# Patient Record
Sex: Male | Born: 1950 | Race: White | Hispanic: No | Marital: Single | Smoking: Current some day smoker
Health system: Southern US, Community
[De-identification: ages and names within clinical notes are randomized; demographics above are authoritative.]

## PROBLEM LIST (undated history)

## (undated) DIAGNOSIS — E785 Hyperlipidemia, unspecified: Secondary | ICD-10-CM

## (undated) DIAGNOSIS — I10 Essential (primary) hypertension: Secondary | ICD-10-CM

## (undated) DIAGNOSIS — E119 Type 2 diabetes mellitus without complications: Secondary | ICD-10-CM

## (undated) DIAGNOSIS — F4310 Post-traumatic stress disorder, unspecified: Secondary | ICD-10-CM

## (undated) HISTORY — PX: TONSILLECTOMY: SUR1361

## (undated) HISTORY — DX: Hyperlipidemia, unspecified: E78.5

## (undated) HISTORY — PX: TONSILLECTOMY: SHX5217

---

## 1997-04-19 HISTORY — PX: BACK SURGERY: SHX140

## 2010-09-22 ENCOUNTER — Observation Stay (HOSPITAL_COMMUNITY)
Admission: EM | Admit: 2010-09-22 | Discharge: 2010-09-23 | Disposition: A | Discharge status: Home / Self Care | Payer: BC Managed Care – PPO | Attending: Emergency Medicine | Admitting: Emergency Medicine

## 2010-09-22 DIAGNOSIS — R5381 Other malaise: Secondary | ICD-10-CM | POA: Insufficient documentation

## 2010-09-22 DIAGNOSIS — I2510 Atherosclerotic heart disease of native coronary artery without angina pectoris: Secondary | ICD-10-CM | POA: Insufficient documentation

## 2010-09-22 DIAGNOSIS — F17200 Nicotine dependence, unspecified, uncomplicated: Secondary | ICD-10-CM | POA: Insufficient documentation

## 2010-09-22 DIAGNOSIS — F411 Generalized anxiety disorder: Secondary | ICD-10-CM | POA: Insufficient documentation

## 2010-09-22 DIAGNOSIS — R079 Chest pain, unspecified: Principal | ICD-10-CM | POA: Insufficient documentation

## 2010-09-22 DIAGNOSIS — R5383 Other fatigue: Secondary | ICD-10-CM | POA: Insufficient documentation

## 2010-09-22 DIAGNOSIS — Z79899 Other long term (current) drug therapy: Secondary | ICD-10-CM | POA: Insufficient documentation

## 2010-09-22 LAB — DIFFERENTIAL
Basophils Absolute: 0 10*3/uL (ref 0.0–0.1)
Basophils Relative: 0 % (ref 0–1)
Eosinophils Absolute: 0.1 10*3/uL (ref 0.0–0.7)
Eosinophils Relative: 1 % (ref 0–5)
Lymphocytes Relative: 32 % (ref 12–46)
Lymphs Abs: 3.1 10*3/uL (ref 0.7–4.0)
Monocytes Absolute: 0.7 10*3/uL (ref 0.1–1.0)
Monocytes Relative: 7 % (ref 3–12)
Neutro Abs: 5.8 10*3/uL (ref 1.7–7.7)
Neutrophils Relative %: 60 % (ref 43–77)

## 2010-09-22 LAB — CBC
HCT: 44.9 % (ref 39.0–52.0)
Hemoglobin: 16.4 g/dL (ref 13.0–17.0)
MCH: 31.8 pg (ref 26.0–34.0)
MCHC: 36.5 g/dL — ABNORMAL HIGH (ref 30.0–36.0)
MCV: 87.2 fL (ref 78.0–100.0)
Platelets: 222 10*3/uL (ref 150–400)
RBC: 5.15 MIL/uL (ref 4.22–5.81)
RDW: 12.3 % (ref 11.5–15.5)
WBC: 9.6 10*3/uL (ref 4.0–10.5)

## 2010-09-22 LAB — BASIC METABOLIC PANEL
BUN: 9 mg/dL (ref 6–23)
CO2: 23 mEq/L (ref 19–32)
Calcium: 10.1 mg/dL (ref 8.4–10.5)
Chloride: 107 mEq/L (ref 96–112)
Creatinine, Ser: 0.66 mg/dL (ref 0.4–1.5)
GFR calc Af Amer: >60 mL/min (ref >60)
GFR calc non Af Amer: >60 mL/min (ref >60)
Glucose, Bld: 232 mg/dL — ABNORMAL HIGH (ref 70–99)
Potassium: 4.1 mEq/L (ref 3.5–5.1)
Sodium: 139 mEq/L (ref 135–145)

## 2010-09-22 LAB — TROPONIN I: Troponin I: <0.3 ng/mL (ref <0.30)

## 2010-09-22 LAB — CK TOTAL AND CKMB (NOT AT ARMC)
CK, MB: 2.2 ng/mL (ref 0.3–4.0)
Relative Index: INVALID (ref 0.0–2.5)
Total CK: 48 U/L (ref 7–232)

## 2010-09-23 ENCOUNTER — Encounter: Payer: Self-pay | Admitting: Internal Medicine

## 2010-09-23 ENCOUNTER — Encounter: Payer: Self-pay | Admitting: *Deleted

## 2010-09-23 ENCOUNTER — Observation Stay (HOSPITAL_COMMUNITY): Payer: BC Managed Care – PPO

## 2010-09-23 ENCOUNTER — Emergency Department (HOSPITAL_COMMUNITY): Payer: BC Managed Care – PPO

## 2010-09-23 LAB — CK TOTAL AND CKMB (NOT AT ARMC)
CK, MB: 2 ng/mL (ref 0.3–4.0)
CK, MB: 1.9 ng/mL (ref 0.3–4.0)
Relative Index: INVALID (ref 0.0–2.5)
Relative Index: INVALID (ref 0.0–2.5)
Total CK: 48 U/L (ref 7–232)
Total CK: 36 U/L (ref 7–232)

## 2010-09-23 LAB — TROPONIN I
Troponin I: <0.3 ng/mL (ref <0.30)
Troponin I: <0.3 ng/mL (ref <0.30)

## 2010-09-23 LAB — GLUCOSE, CAPILLARY: Glucose-Capillary: 224 mg/dL — ABNORMAL HIGH (ref 70–99)

## 2010-09-23 MED ORDER — IOHEXOL 350 MG/ML SOLN
80.0000 mL | Freq: Once | INTRAVENOUS | Status: AC | PRN
  Administered 2010-09-23: 80 mL via INTRAVENOUS

## 2010-09-24 ENCOUNTER — Ambulatory Visit (INDEPENDENT_AMBULATORY_CARE_PROVIDER_SITE_OTHER): Payer: BC Managed Care – PPO | Admitting: Internal Medicine

## 2010-09-24 DIAGNOSIS — I10 Essential (primary) hypertension: Secondary | ICD-10-CM | POA: Insufficient documentation

## 2010-09-24 DIAGNOSIS — I2510 Atherosclerotic heart disease of native coronary artery without angina pectoris: Secondary | ICD-10-CM

## 2010-09-24 DIAGNOSIS — E7800 Pure hypercholesterolemia, unspecified: Secondary | ICD-10-CM

## 2010-09-24 DIAGNOSIS — R079 Chest pain, unspecified: Secondary | ICD-10-CM | POA: Insufficient documentation

## 2010-09-24 DIAGNOSIS — E785 Hyperlipidemia, unspecified: Secondary | ICD-10-CM

## 2010-09-24 DIAGNOSIS — I152 Hypertension secondary to endocrine disorders: Secondary | ICD-10-CM | POA: Insufficient documentation

## 2010-09-24 DIAGNOSIS — I119 Hypertensive heart disease without heart failure: Secondary | ICD-10-CM

## 2010-09-24 MED ORDER — OMEPRAZOLE 40 MG PO CPDR: 40.0000 mg | DELAYED_RELEASE_CAPSULE | Freq: Every day | ORAL | Status: DC

## 2010-09-24 NOTE — Patient Instructions (Signed)
Your physician has recommended you make the following change in your medication: Start omeprazole 40 mg by mouth daily.

## 2010-09-24 NOTE — Assessment & Plan Note (Signed)
Patient with chest pain that had some characteristics typ of angina.  But cardiac CT with mild plaquing only. I would recomm empric Rx for reflux.   He should continue ASA.  He needs to aggressively control lipids and hypertension. He would like to f/u with Dr Kathi Der office.

## 2010-09-24 NOTE — Progress Notes (Signed)
HPI  Patient is a 60 year old who presents for evaluation of chest pain.   The patient has no known history of CAD.  He says that about 1 month ago he was outside digging when he developed chest pressure.  Associated with some SOB.  Occurred on/off over several days.  At home now he continues to have intermitt spells.  Feels weak with them. He was seen at Dr. Kathi Der office.  Referred for evaluation This week he had continued spells, now after eatting. He went the the ER yesterday.  Work up included a cardiac CT.  This showed only mild plaquing.  CA score was low.  He presents for follow up.  Denies reflux.    Allergies  Allergen Reactions  . Fish Oil   . Shellfish Allergy   . Ultram (Tramadol Hcl)     Current Outpatient Prescriptions  Medication Sig Dispense Refill  . aspirin 325 MG tablet Take 325 mg by mouth every 6 (six) hours as needed.        . nitroGLYCERIN (NITROSTAT) 0.4 MG SL tablet Place 0.4 mg under the tongue every 5 (five) minutes as needed.        Marland Kitchen omeprazole (PRILOSEC) 40 MG capsule Take 1 capsule (40 mg total) by mouth daily.  30 capsule  1    Past Medical History  Diagnosis Date  . Chest pain     No past surgical history on file.  No family history on file.  History   Social History  . Marital Status: Single    Spouse Name: N/A    Number of Children: N/A  . Years of Education: N/A   Occupational History  . Not on file.   Social History Main Topics  . Smoking status: Not on file  . Smokeless tobacco: Not on file  . Alcohol Use: Not on file  . Drug Use: Not on file  . Sexually Active: Not on file   Other Topics Concern  . Not on file   Social History Narrative  . No narrative on file    Review of Systems:  All systems reviewed.  They are negative to the above problem except as previously stated.  Vital Signs: BP 154/82  Pulse 92  Resp 18  Ht 5\' 9"  (1.753 m)  Wt 229 lb (103.874 kg)  BMI 33.82 kg/m2  Physical Exam  Patient is in  NAD HEENT:  Normocephalic, atraumatic. EOMI, PERRLA.  Neck: JVP is normal. No thyromegaly. No bruits.  Lungs: clear to auscultation. No rales no wheezes.  Heart: Regular rate and rhythm. Normal S1, S2. No S3.   No significant murmurs. PMI not displaced.  Abdomen:  Supple, nontender. Normal bowel sounds. No masses. No hepatomegaly.  Extremities:   Good distal pulses throughout. No lower extremity edema.  Musculoskeletal :moving all extremities.  Neuro:   alert and oriented x3.  CN II-XII grossly intact.   Assessment and Plan:

## 2010-09-25 NOTE — Assessment & Plan Note (Signed)
Patient says his BP has been at this level for years.  I would recomm tighter control.  I will defer to Dr Christell Constant.

## 2010-09-25 NOTE — Assessment & Plan Note (Signed)
Patient's lipids were recently checked.  He has a prescription for Lipitor which he has not filled yet.  Needs to .  Again f/u with Dr Kathi Der office.

## 2010-10-15 ENCOUNTER — Encounter: Payer: Self-pay | Admitting: Internal Medicine

## 2012-10-30 ENCOUNTER — Ambulatory Visit (INDEPENDENT_AMBULATORY_CARE_PROVIDER_SITE_OTHER): Payer: BC Managed Care – PPO | Admitting: Family Medicine

## 2012-10-30 ENCOUNTER — Encounter: Payer: Self-pay | Admitting: Family Medicine

## 2012-10-30 VITALS — BP 180/105 | HR 146 | Temp 98.2°F | Resp 24 | Ht 69.0 in | Wt 236.0 lb

## 2012-10-30 DIAGNOSIS — F410 Panic disorder [episodic paroxysmal anxiety] without agoraphobia: Secondary | ICD-10-CM

## 2012-10-30 DIAGNOSIS — Z91199 Patient's noncompliance with other medical treatment and regimen due to unspecified reason: Secondary | ICD-10-CM

## 2012-10-30 DIAGNOSIS — R0602 Shortness of breath: Secondary | ICD-10-CM | POA: Insufficient documentation

## 2012-10-30 DIAGNOSIS — E119 Type 2 diabetes mellitus without complications: Secondary | ICD-10-CM | POA: Insufficient documentation

## 2012-10-30 DIAGNOSIS — Z9119 Patient's noncompliance with other medical treatment and regimen: Secondary | ICD-10-CM | POA: Insufficient documentation

## 2012-10-30 DIAGNOSIS — IMO0001 Reserved for inherently not codable concepts without codable children: Secondary | ICD-10-CM

## 2012-10-30 DIAGNOSIS — I10 Essential (primary) hypertension: Secondary | ICD-10-CM

## 2012-10-30 DIAGNOSIS — E785 Hyperlipidemia, unspecified: Secondary | ICD-10-CM

## 2012-10-30 LAB — POCT CBC
Granulocyte percent: 56.2 %G (ref 37–80)
HCT, POC: 47.2 % (ref 43.5–53.7)
Hemoglobin: 16.5 g/dL (ref 14.1–18.1)
Lymph, poc: 3.9 — AB (ref 0.6–3.4)
MCH, POC: 31.3 pg — AB (ref 27–31.2)
MCHC: 35 g/dL (ref 31.8–35.4)
MCV: 89.3 fL (ref 80–97)
MPV: 7.2 fL (ref 0–99.8)
POC Granulocyte: 5.4 (ref 2–6.9)
POC LYMPH PERCENT: 40.2 %L (ref 10–50)
Platelet Count, POC: 273 10*3/uL (ref 142–424)
RBC: 5.3 M/uL (ref 4.69–6.13)
RDW, POC: 13 %
WBC: 9.6 10*3/uL (ref 4.6–10.2)

## 2012-10-30 LAB — GLUCOSE, POCT (MANUAL RESULT ENTRY): POC Glucose: 318 mg/dl — AB (ref 70–99)

## 2012-10-30 MED ORDER — SERTRALINE HCL 50 MG PO TABS: 50.0000 mg | ORAL_TABLET | Freq: Every day | ORAL | Status: DC

## 2012-10-30 MED ORDER — AMLODIPINE BESYLATE 10 MG PO TABS: 10.0000 mg | ORAL_TABLET | Freq: Every day | ORAL | Status: DC

## 2012-10-30 MED ORDER — METFORMIN HCL 1000 MG PO TABS: 1000.0000 mg | ORAL_TABLET | Freq: Two times a day (BID) | ORAL | Status: DC

## 2012-10-30 NOTE — Patient Instructions (Signed)
      Dr Geniya Fulgham's Recommendations  Diet and Exercise discussed with patient.  For nutrition information, I recommend books:  1).Eat to Live by Dr Joel Fuhrman. 2).Prevent and Reverse Heart Disease by Dr Caldwell Esselstyn. 3) Dr Neal Barnard's Book:  Program to Reverse Diabetes  Exercise recommendations are:  If unable to walk, then the patient can exercise in a chair 3 times a day. By flapping arms like a bird gently and raising legs outwards to the front.  If ambulatory, the patient can go for walks for 30 minutes 3 times a week. Then increase the intensity and duration as tolerated.  Goal is to try to attain exercise frequency to 5 times a week.  If applicable: Best to perform resistance exercises (machines or weights) 2 days a week and cardio type exercises 3 days per week.  

## 2012-10-30 NOTE — Progress Notes (Signed)
Patient ID: Philip [name], male   DOB: November 24, 1950, 62 y.o.   MRN: [id number] SUBJECTIVE: CC: Chief Complaint  Patient presents with  . Shortness of Breath    diaphoretic, labored breathing.  Denies cough, chest pain or nausea.  Feels like he is having a panic attack.   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 He was feeding chickens over the weekend and the chicken feed dust made him cough. He also has been out of his medications for DM because he was told he needed to be seen and he refused to come in. He has been noncompliant. He denies chest pain.  Past Medical History  Diagnosis Date  . Chest pain    No past surgical history on file. History   Social History  . Marital Status: Single    Spouse Name: N/A    Number of Children: N/A  . Years of Education: N/A   Occupational History  . Not on file.   Social History Main Topics  . Smoking status: Former Games developer  . Smokeless tobacco: Not on file  . Alcohol Use: No  . Drug Use: No  . Sexually Active: Not on file   Other Topics Concern  . Not on file   Social History Narrative  . No narrative on file   No family history on file. Current Outpatient Prescriptions on File Prior to Visit  Medication Sig Dispense Refill  . aspirin 325 MG tablet Take 325 mg by mouth every 6 (six) hours as needed.        . nitroGLYCERIN (NITROSTAT) 0.4 MG SL tablet Place 0.4 mg under the tongue every 5 (five) minutes as needed.        Marland Kitchen omeprazole (PRILOSEC) 40 MG capsule Take 1 capsule (40 mg total) by mouth daily.  30 capsule  1   No current facility-administered medications on file prior to visit.   Allergies  Allergen Reactions  . Fish Oil   . Shellfish Allergy   . Ultram (Tramadol Hcl)     There is no immunization history on file for this patient. Prior to Admission medications   Medication Sig Start Date End Date Taking? Authorizing Provider  amLODipine (NORVASC) 10 MG tablet Take 1 tablet  (10 mg total) by mouth daily. 10/30/12   Ileana Ladd, MD  aspirin 325 MG tablet Take 325 mg by mouth every 6 (six) hours as needed.      Historical Provider, MD  metFORMIN (GLUCOPHAGE) 1000 MG tablet Take 1 tablet (1,000 mg total) by mouth 2 (two) times daily with a meal. 10/30/12   Ileana Ladd, MD  nitroGLYCERIN (NITROSTAT) 0.4 MG SL tablet Place 0.4 mg under the tongue every 5 (five) minutes as needed.      Historical Provider, MD  omeprazole (PRILOSEC) 40 MG capsule Take 1 capsule (40 mg total) by mouth daily. 09/24/10 09/24/11  Pricilla Riffle, MD  sertraline (ZOLOFT) 50 MG tablet Take 1 tablet (50 mg total) by mouth daily. 10/30/12   Ileana Ladd, MD    ROS: As above in the HPI. All other systems are stable or negative.  OBJECTIVE: APPEARANCE:  Patient in no acute distress.The patient appeared well nourished and normally developed. Acyanotic. Waist: VITAL SIGNS:BP 180/105  Pulse 146  Temp(Src) 98.2 F (36.8 C) (Oral)  Resp 24  Ht 5\' 9"  (1.753 m)  Wt 236 lb (107.049 kg)  BMI 34.84 kg/m2  SpO2 97% WM anxious  claiming a panic attack. Obese.  SKIN: warm and  Dry without overt rashes, tattoos and scars  HEAD and Neck: without JVD, Head and scalp: normal Eyes:No scleral icterus. Fundi normal, eye movements normal. Ears: Auricle normal, canal normal, Tympanic membranes normal, insufflation normal. Nose: normal Throat: normal Neck & thyroid: normal  CHEST & LUNGS: Chest wall: normal Lungs: Clear  CVS: Reveals the PMI to be normally located. Regular rhythm, First and Second Heart sounds are normal,  absence of murmurs, rubs or gallops. Peripheral vasculature: Radial pulses: normal Dorsal pedis pulses: normal Posterior pulses: normal  ABDOMEN:  Appearance: obese Benign, no organomegaly, no masses, no Abdominal Aortic enlargement. No Guarding , no rebound. No Bruits. Bowel sounds: normal  RECTAL: N/A GU: N/A  EXTREMETIES: nonedematous. Both Femoral and Pedal  pulses are normal.  MUSCULOSKELETAL:  Spine: normal Joints: intact  NEUROLOGIC: oriented to time,place and person; nonfocal. Strength is normal Sensory is normal Reflexes are normal Cranial Nerves are normal.  Psychiatry: Panicking., without any triggers. He was told to breathe  Slowly to prevent carpopedal spasm.  ASSESSMENT: Shortness of breath - Plan: POCT CBC, POCT glucose (manual entry), EKG 12-Lead, sertraline (ZOLOFT) 50 MG tablet  Hypertension - Plan: COMPLETE METABOLIC PANEL WITH GFR, amLODipine (NORVASC) 10 MG tablet  Dyslipidemia - Plan: COMPLETE METABOLIC PANEL WITH GFR  Type II or unspecified type diabetes mellitus without mention of complication, uncontrolled - Plan: POCT glycosylated hemoglobin (Hb A1C), COMPLETE METABOLIC PANEL WITH GFR, metFORMIN (GLUCOPHAGE) 1000 MG tablet  Panic disorder - Plan: COMPLETE METABOLIC PANEL WITH GFR, Thyroid Panel With TSH, sertraline (ZOLOFT) 50 MG tablet  Non compliance with medical treatment  BP high and patient was hyperventilating initially.  PLAN: With his  Risk factors , he was advised need for ED evaluation: he  Refused. He acknowledges non compliance and acting Against Medical advise. He  Requests treatment for panic attack.  Orders Placed This Encounter  Procedures  . COMPLETE METABOLIC PANEL WITH GFR  . Thyroid Panel With TSH  . POCT CBC  . POCT glucose (manual entry)  . POCT glycosylated hemoglobin (Hb A1C)  . EKG 12-Lead   Meds ordered this encounter  Medications  . sertraline (ZOLOFT) 50 MG tablet    Sig: Take 1 tablet (50 mg total) by mouth daily.    Dispense:  30 tablet    Refill:  3  . metFORMIN (GLUCOPHAGE) 1000 MG tablet    Sig: Take 1 tablet (1,000 mg total) by mouth 2 (two) times daily with a meal.    Dispense:  60 tablet    Refill:  2  . amLODipine (NORVASC) 10 MG tablet    Sig: Take 1 tablet (10 mg total) by mouth daily.    Dispense:  30 tablet    Refill:  2   EKG: no acute  findings.  Return in about 1 day (around 10/31/2012) for Recheck medical problems. We no don't have a standard form for patient to sign AMA since we went live on [employer]. Patient affirms that he would sign it if we had it.  He was happy with the treatment given today, and really refused anything else.  Anisten Tomassi P. Modesto Charon, M.D.

## 2012-10-31 ENCOUNTER — Ambulatory Visit (INDEPENDENT_AMBULATORY_CARE_PROVIDER_SITE_OTHER): Payer: BC Managed Care – PPO | Admitting: Family Medicine

## 2012-10-31 ENCOUNTER — Ambulatory Visit (INDEPENDENT_AMBULATORY_CARE_PROVIDER_SITE_OTHER): Payer: BC Managed Care – PPO

## 2012-10-31 ENCOUNTER — Encounter: Payer: Self-pay | Admitting: Family Medicine

## 2012-10-31 VITALS — BP 158/86 | HR 109 | Temp 97.9°F | Wt 233.0 lb

## 2012-10-31 DIAGNOSIS — E785 Hyperlipidemia, unspecified: Secondary | ICD-10-CM

## 2012-10-31 DIAGNOSIS — R0989 Other specified symptoms and signs involving the circulatory and respiratory systems: Secondary | ICD-10-CM

## 2012-10-31 DIAGNOSIS — R0602 Shortness of breath: Secondary | ICD-10-CM

## 2012-10-31 DIAGNOSIS — R0609 Other forms of dyspnea: Secondary | ICD-10-CM

## 2012-10-31 DIAGNOSIS — R0600 Dyspnea, unspecified: Secondary | ICD-10-CM

## 2012-10-31 DIAGNOSIS — IMO0001 Reserved for inherently not codable concepts without codable children: Secondary | ICD-10-CM

## 2012-10-31 DIAGNOSIS — I10 Essential (primary) hypertension: Secondary | ICD-10-CM

## 2012-10-31 DIAGNOSIS — F410 Panic disorder [episodic paroxysmal anxiety] without agoraphobia: Secondary | ICD-10-CM

## 2012-10-31 LAB — COMPLETE METABOLIC PANEL WITH GFR
ALT: 28 U/L (ref 0–53)
AST: 23 U/L (ref 0–37)
Albumin: 4.3 g/dL (ref 3.5–5.2)
Alkaline Phosphatase: 111 U/L (ref 39–117)
BUN: 11 mg/dL (ref 6–23)
CO2: 20 mEq/L (ref 19–32)
Calcium: 10.1 mg/dL (ref 8.4–10.5)
Chloride: 100 mEq/L (ref 96–112)
Creat: 0.83 mg/dL (ref 0.50–1.35)
GFR, Est African American: >89 mL/min
GFR, Est Non African American: >89 mL/min
Glucose, Bld: 371 mg/dL — ABNORMAL HIGH (ref 70–99)
Potassium: 4.2 mEq/L (ref 3.5–5.3)
Sodium: 136 mEq/L (ref 135–145)
Total Bilirubin: 0.4 mg/dL (ref 0.3–1.2)
Total Protein: 6.5 g/dL (ref 6.0–8.3)

## 2012-10-31 LAB — THYROID PANEL WITH TSH
Free Thyroxine Index: 2.4 (ref 1.0–3.9)
T3 Uptake: 34.6 % (ref 22.5–37.0)
T4, Total: 7 ug/dL (ref 5.0–12.5)
TSH: 1.516 u[IU]/mL (ref 0.350–4.500)

## 2012-10-31 LAB — POCT GLYCOSYLATED HEMOGLOBIN (HGB A1C): Hemoglobin A1C: 11.9

## 2012-10-31 NOTE — Progress Notes (Signed)
Patient ID: Philip [name], male   DOB: 07/15/1950, 62 y.o.   MRN: [id number] SUBJECTIVE: CC: Chief Complaint  Patient presents with  . Follow-up    1 day reck feeling better    HPI: Here for follow up of uncontrolled hypertension, uncontrolled DM, dyslipidemia with panic and hyperventilation. He feels much better and breathing fine. Bp coming down, just started his BP meds last night, just started the Zoloft this am, sugar is improving. No chest pain ,   Past Medical History  Diagnosis Date  . Chest pain    No past surgical history on file. History   Social History  . Marital Status: Single    Spouse Name: N/A    Number of Children: N/A  . Years of Education: N/A   Occupational History  . Not on file.   Social History Main Topics  . Smoking status: Former Games developer  . Smokeless tobacco: Not on file  . Alcohol Use: No  . Drug Use: No  . Sexually Active: Not on file   Other Topics Concern  . Not on file   Social History Narrative  . No narrative on file   No family history on file. Current Outpatient Prescriptions on File Prior to Visit  Medication Sig Dispense Refill  . amLODipine (NORVASC) 10 MG tablet Take 1 tablet (10 mg total) by mouth daily.  30 tablet  2  . aspirin 325 MG tablet Take 325 mg by mouth every 6 (six) hours as needed.        . metFORMIN (GLUCOPHAGE) 1000 MG tablet Take 1 tablet (1,000 mg total) by mouth 2 (two) times daily with a meal.  60 tablet  2  . sertraline (ZOLOFT) 50 MG tablet Take 1 tablet (50 mg total) by mouth daily.  30 tablet  3  . nitroGLYCERIN (NITROSTAT) 0.4 MG SL tablet Place 0.4 mg under the tongue every 5 (five) minutes as needed.        Marland Kitchen omeprazole (PRILOSEC) 40 MG capsule Take 1 capsule (40 mg total) by mouth daily.  30 capsule  1   No current facility-administered medications on file prior to visit.   Allergies  Allergen Reactions  . Fish Oil   . Shellfish Allergy   . Ultram (Tramadol Hcl)     There is no immunization  history on file for this patient. Prior to Admission medications   Medication Sig Start Date End Date Taking? Authorizing Provider  amLODipine (NORVASC) 10 MG tablet Take 1 tablet (10 mg total) by mouth daily. 10/30/12  Yes Ileana Ladd, MD  aspirin 325 MG tablet Take 325 mg by mouth every 6 (six) hours as needed.     Yes Historical Provider, MD  metFORMIN (GLUCOPHAGE) 1000 MG tablet Take 1 tablet (1,000 mg total) by mouth 2 (two) times daily with a meal. 10/30/12  Yes Ileana Ladd, MD  sertraline (ZOLOFT) 50 MG tablet Take 1 tablet (50 mg total) by mouth daily. 10/30/12  Yes Ileana Ladd, MD  nitroGLYCERIN (NITROSTAT) 0.4 MG SL tablet Place 0.4 mg under the tongue every 5 (five) minutes as needed.      Historical Provider, MD  omeprazole (PRILOSEC) 40 MG capsule Take 1 capsule (40 mg total) by mouth daily. 09/24/10 09/24/11  Pricilla Riffle, MD    ROS: As above in the HPI. All other systems are stable or negative.  OBJECTIVE: APPEARANCE:  Patient in no acute distress.The patient appeared well nourished and normally developed. Acyanotic. Waist: VITAL SIGNS:BP  158/86  Pulse 109  Temp(Src) 97.9 F (36.6 C) (Oral)  Wt 233 lb (105.688 kg)  BMI 34.39 kg/m2 WM obese calm and looks well today  SKIN: warm and  Dry without overt rashes, tattoos and scars  HEAD and Neck: without JVD, Head and scalp: normal Eyes:No scleral icterus. Fundi normal, eye movements normal. Ears: Auricle normal, canal normal, Tympanic membranes normal, insufflation normal. Nose: normal Throat: normal Neck & thyroid: normal  CHEST & LUNGS: Chest wall: normal Lungs: Clear  CVS: Reveals the PMI to be normally located. Regular rhythm, First and Second Heart sounds are normal,  absence of murmurs, rubs or gallops. Peripheral vasculature: Radial pulses: normal Dorsal pedis pulses: normal Posterior pulses: normal  ABDOMEN:  Appearance: obese. Benign, no organomegaly, no masses, no Abdominal Aortic  enlargement. No Guarding , no rebound. No Bruits. Bowel sounds: normal  RECTAL: N/A GU: N/A  EXTREMETIES: nonedematous. Both Femoral and Pedal pulses are normal.  MUSCULOSKELETAL:  Spine: normal Joints: intact  NEUROLOGIC: oriented to time,place and person; nonfocal. Strength is normal Sensory is normal Reflexes are normal Cranial Nerves are normal.  ASSESSMENT: Dyspnea - Plan: DG Chest 2 View  Type II or unspecified type diabetes mellitus without mention of complication, uncontrolled  Panic disorder  Shortness of breath  Hypertension  Dyslipidemia  Improving with compliance.  Reviewed labs Results for orders placed in visit on 10/30/12  COMPLETE METABOLIC PANEL WITH GFR      Result Value Range   Sodium 136  135 - 145 mEq/L   Potassium 4.2  3.5 - 5.3 mEq/L   Chloride 100  96 - 112 mEq/L   CO2 20  19 - 32 mEq/L   Glucose, Bld 371 (*) 70 - 99 mg/dL   BUN 11  6 - 23 mg/dL   Creat 3.08  6.57 - 8.46 mg/dL   Total Bilirubin 0.4  0.3 - 1.2 mg/dL   Alkaline Phosphatase 111  39 - 117 U/L   AST 23  0 - 37 U/L   ALT 28  0 - 53 U/L   Total Protein 6.5  6.0 - 8.3 g/dL   Albumin 4.3  3.5 - 5.2 g/dL   Calcium 96.2  8.4 - 95.2 mg/dL   GFR, Est African American >89     GFR, Est Non African American >89    THYROID PANEL WITH TSH      Result Value Range   T4, Total 7.0  5.0 - 12.5 ug/dL   T3 Uptake 84.1  32.4 - 37.0 %   Free Thyroxine Index 2.4  1.0 - 3.9   TSH 1.516  0.350 - 4.500 uIU/mL  POCT CBC      Result Value Range   WBC 9.6  4.6 - 10.2 K/uL   Lymph, poc 3.9 (*) 0.6 - 3.4   POC LYMPH PERCENT 40.2  10 - 50 %L   POC Granulocyte 5.4  2 - 6.9   Granulocyte percent 56.2  37 - 80 %G   RBC 5.3  4.69 - 6.13 M/uL   Hemoglobin 16.5  14.1 - 18.1 g/dL   HCT, POC 40.1  02.7 - 53.7 %   MCV 89.3  80 - 97 fL   MCH, POC 31.3 (*) 27 - 31.2 pg   MCHC 35.0  31.8 - 35.4 g/dL   RDW, POC 25.3     Platelet Count, POC 273.0  142 - 424 K/uL   MPV 7.2  0 - 99.8 fL  GLUCOSE, POCT  (MANUAL  RESULT ENTRY)      Result Value Range   POC Glucose 318 (*) 70 - 99 mg/dl     PLAN: [facility] reading (PRIMARY) by  Dr. Modesto Charon: no acute findings. Compliance and continued medications. Await the HGBA1C result  Return in about 4 weeks (around 11/28/2012) for Recheck medical problems. blood sugar.   Nyisha Clippard P. Modesto Charon, M.D.

## 2012-11-01 ENCOUNTER — Emergency Department (HOSPITAL_COMMUNITY): Payer: BC Managed Care – PPO

## 2012-11-01 ENCOUNTER — Encounter (HOSPITAL_COMMUNITY): Payer: Self-pay

## 2012-11-01 ENCOUNTER — Emergency Department (HOSPITAL_COMMUNITY)
Admission: EM | Admit: 2012-11-01 | Discharge: 2012-11-01 | Disposition: A | Discharge status: Home / Self Care | Payer: BC Managed Care – PPO | Attending: Emergency Medicine | Admitting: Emergency Medicine

## 2012-11-01 DIAGNOSIS — R61 Generalized hyperhidrosis: Secondary | ICD-10-CM | POA: Insufficient documentation

## 2012-11-01 DIAGNOSIS — R209 Unspecified disturbances of skin sensation: Secondary | ICD-10-CM | POA: Insufficient documentation

## 2012-11-01 DIAGNOSIS — I10 Essential (primary) hypertension: Secondary | ICD-10-CM | POA: Insufficient documentation

## 2012-11-01 DIAGNOSIS — Z8679 Personal history of other diseases of the circulatory system: Secondary | ICD-10-CM | POA: Insufficient documentation

## 2012-11-01 DIAGNOSIS — E860 Dehydration: Secondary | ICD-10-CM | POA: Insufficient documentation

## 2012-11-01 DIAGNOSIS — Z87891 Personal history of nicotine dependence: Secondary | ICD-10-CM | POA: Insufficient documentation

## 2012-11-01 DIAGNOSIS — Z79899 Other long term (current) drug therapy: Secondary | ICD-10-CM | POA: Insufficient documentation

## 2012-11-01 DIAGNOSIS — E119 Type 2 diabetes mellitus without complications: Secondary | ICD-10-CM | POA: Insufficient documentation

## 2012-11-01 HISTORY — DX: Type 2 diabetes mellitus without complications: E11.9

## 2012-11-01 HISTORY — DX: Essential (primary) hypertension: I10

## 2012-11-01 LAB — COMPREHENSIVE METABOLIC PANEL
ALT: 32 U/L (ref 0–53)
AST: 25 U/L (ref 0–37)
Albumin: 3.7 g/dL (ref 3.5–5.2)
Alkaline Phosphatase: 120 U/L — ABNORMAL HIGH (ref 39–117)
BUN: 12 mg/dL (ref 6–23)
CO2: 23 mEq/L (ref 19–32)
Calcium: 10.5 mg/dL (ref 8.4–10.5)
Chloride: 102 mEq/L (ref 96–112)
Creatinine, Ser: 0.88 mg/dL (ref 0.50–1.35)
GFR calc Af Amer: >90 mL/min (ref >90)
GFR calc non Af Amer: >90 mL/min (ref >90)
Glucose, Bld: 413 mg/dL — ABNORMAL HIGH (ref 70–99)
Potassium: 4 mEq/L (ref 3.5–5.1)
Sodium: 136 mEq/L (ref 135–145)
Total Bilirubin: 0.3 mg/dL (ref 0.3–1.2)
Total Protein: 6.8 g/dL (ref 6.0–8.3)

## 2012-11-01 LAB — CBC WITH DIFFERENTIAL/PLATELET
Basophils Absolute: 0.1 10*3/uL (ref 0.0–0.1)
Basophils Relative: 1 % (ref 0–1)
Eosinophils Absolute: 0.1 10*3/uL (ref 0.0–0.7)
Eosinophils Relative: 2 % (ref 0–5)
HCT: 42.9 % (ref 39.0–52.0)
Hemoglobin: 15.3 g/dL (ref 13.0–17.0)
Lymphocytes Relative: 42 % (ref 12–46)
Lymphs Abs: 3.4 10*3/uL (ref 0.7–4.0)
MCH: 31.4 pg (ref 26.0–34.0)
MCHC: 35.7 g/dL (ref 30.0–36.0)
MCV: 87.9 fL (ref 78.0–100.0)
Monocytes Absolute: 0.8 10*3/uL (ref 0.1–1.0)
Monocytes Relative: 9 % (ref 3–12)
Neutro Abs: 3.7 10*3/uL (ref 1.7–7.7)
Neutrophils Relative %: 46 % (ref 43–77)
Platelets: 244 10*3/uL (ref 150–400)
RBC: 4.88 MIL/uL (ref 4.22–5.81)
RDW: 12.4 % (ref 11.5–15.5)
WBC: 8 10*3/uL (ref 4.0–10.5)

## 2012-11-01 LAB — GLUCOSE, CAPILLARY: Glucose-Capillary: 231 mg/dL — ABNORMAL HIGH (ref 70–99)

## 2012-11-01 LAB — PRO B NATRIURETIC PEPTIDE: Pro B Natriuretic peptide (BNP): 17.9 pg/mL (ref 0–125)

## 2012-11-01 LAB — D-DIMER, QUANTITATIVE: D-Dimer, Quant: <0.27 ug/mL-FEU (ref 0.00–0.48)

## 2012-11-01 LAB — TROPONIN I: Troponin I: <0.3 ng/mL (ref <0.30)

## 2012-11-01 MED ORDER — SODIUM CHLORIDE 0.9 % IV BOLUS (SEPSIS)
1000.0000 mL | Freq: Once | INTRAVENOUS | Status: AC
  Administered 2012-11-01: 1000 mL via INTRAVENOUS

## 2012-11-01 MED ORDER — SODIUM CHLORIDE 0.9 % IV BOLUS (SEPSIS): 500.0000 mL | Freq: Once | INTRAVENOUS | Status: DC

## 2012-11-01 MED ORDER — INSULIN ASPART 100 UNIT/ML ~~LOC~~ SOLN
15.0000 [IU] | Freq: Once | SUBCUTANEOUS | Status: AC
  Administered 2012-11-01: 15 [IU] via SUBCUTANEOUS
  Filled 2012-11-01: qty 1

## 2012-11-01 NOTE — ED Notes (Signed)
Dr Zammit at bedside,  

## 2012-11-01 NOTE — ED Provider Notes (Signed)
History    This chart was scribed for Philip Lennert, MD by [name], ED Scribe. This patient was seen in room APA18/[facility]18 and the patient's care was started at 1623.  CSN: [id number] Arrival date & time 11/01/12  1619  First MD Initiated Contact with Patient 11/01/12 1623     Chief Complaint  Patient presents with  . Fatigue    Patient is a 62 y.o. male presenting with weakness. The history is provided by the patient. No language interpreter was used.  Weakness This is a new problem. The current episode started 2 days ago. The problem has been gradually worsening. Pertinent negatives include no chest pain. Nothing relieves the symptoms. He has tried nothing for the symptoms.   HPI Comments: Philip [name] is a 63 y.o. male who presents to the Emergency Department complaining of gradual onset, gradually worsening fatigue and weakness that began a few days ago. He also reports sudden onset diaphoresis, intermittent arm and wrist pain, and generalized numbness that is worse in his face that began today. He denies nausea of CP. He states he had a hypertension crisis 3 days ago as well as an elevated blood sugar level of 318. He has seen a cardiologist (2012) but denies having a cardiac stress test or catheterization.   Past Medical History  Diagnosis Date  . Chest pain    No past surgical history on file. No family history on file. History  Substance Use Topics  . Smoking status: Former Games developer  . Smokeless tobacco: Not on file  . Alcohol Use: No    Review of Systems  Constitutional: Positive for diaphoresis and fatigue. Negative for appetite change.  HENT: Negative for congestion, sinus pressure and ear discharge.   Eyes: Negative for discharge.  Cardiovascular: Negative for chest pain.  Gastrointestinal: Negative for nausea and diarrhea.  Genitourinary: Negative for frequency and hematuria.  Skin: Negative for rash.  Neurological: Positive for weakness and numbness.  Negative for seizures.  Psychiatric/Behavioral: Negative for hallucinations.    Allergies  Fish oil; Shellfish allergy; and Ultram  Home Medications   Current Outpatient Rx  Name  Route  Sig  Dispense  Refill  . amLODipine (NORVASC) 10 MG tablet   Oral   Take 1 tablet (10 mg total) by mouth daily.   30 tablet   2   . aspirin 325 MG tablet   Oral   Take 325 mg by mouth every 6 (six) hours as needed.           . metFORMIN (GLUCOPHAGE) 1000 MG tablet   Oral   Take 1 tablet (1,000 mg total) by mouth 2 (two) times daily with a meal.   60 tablet   2   . nitroGLYCERIN (NITROSTAT) 0.4 MG SL tablet   Sublingual   Place 0.4 mg under the tongue every 5 (five) minutes as needed.           Marland Kitchen EXPIRED: omeprazole (PRILOSEC) 40 MG capsule   Oral   Take 1 capsule (40 mg total) by mouth daily.   30 capsule   1   . sertraline (ZOLOFT) 50 MG tablet   Oral   Take 1 tablet (50 mg total) by mouth daily.   30 tablet   3    BP 176/96  Temp(Src) 98.8 F (37.1 C) (Oral)  Resp 20  Ht 5\' 9"  (1.753 m)  Wt 240 lb (108.863 kg)  BMI 35.43 kg/m2  SpO2 94%  Physical Exam  Constitutional:  He is oriented to person, place, and time. He appears well-developed.  HENT:  Head: Normocephalic.  Mouth/Throat: Mucous membranes are dry.  Eyes: Conjunctivae and EOM are normal. No scleral icterus.  Neck: Neck supple. No thyromegaly present.  Cardiovascular: Regular rhythm.  Tachycardia present.  Exam reveals no gallop and no friction rub.   No murmur heard. Pulmonary/Chest: No stridor. He has no wheezes. He has no rales. He exhibits no tenderness.  Abdominal: He exhibits no distension. There is no tenderness. There is no rebound.  Musculoskeletal: Normal range of motion. He exhibits no edema.  Lymphadenopathy:    He has no cervical adenopathy.  Neurological: He is oriented to person, place, and time. Coordination normal.  Skin: No rash noted. No erythema.  Psychiatric: He has a normal mood  and affect. His behavior is normal.    ED Course  Procedures (including critical care time) DIAGNOSTIC STUDIES: Oxygen Saturation is 94% on RA, adequate by my interpretation.    COORDINATION OF CARE: 4:29 PM Discussed treatment plan which includes IV fluids, CXR, BNP, CBC with differential, comprehensive metabolic panel, troponin and EKG with pt at bedside and pt agreed to plan.   7:18 PM Rechecked pt. Discussed lab and imaging results. Will check pts blood sugar level. If normal I will discharge home.  7:40 PM Rechecked pt. He states that he has not taken his DM medication for the past 3 months, and just began taking it again 3 days ago.   Date: 11/01/2012  Rate: 120  Rhythm: normal sinus rhythm  QRS Axis: normal  Intervals: normal  ST/T Wave abnormalities: normal  Conduction Disutrbances:none  Narrative Interpretation:   Old EKG Reviewed: changes noted  Results for orders placed during the hospital encounter of 11/01/12  CBC WITH DIFFERENTIAL      Result Value Range   WBC 8.0  4.0 - 10.5 K/uL   RBC 4.88  4.22 - 5.81 MIL/uL   Hemoglobin 15.3  13.0 - 17.0 g/dL   HCT 16.1  09.6 - 04.5 %   MCV 87.9  78.0 - 100.0 fL   MCH 31.4  26.0 - 34.0 pg   MCHC 35.7  30.0 - 36.0 g/dL   RDW 40.9  81.1 - 91.4 %   Platelets 244  150 - 400 K/uL   Neutrophils Relative % 46  43 - 77 %   Neutro Abs 3.7  1.7 - 7.7 K/uL   Lymphocytes Relative 42  12 - 46 %   Lymphs Abs 3.4  0.7 - 4.0 K/uL   Monocytes Relative 9  3 - 12 %   Monocytes Absolute 0.8  0.1 - 1.0 K/uL   Eosinophils Relative 2  0 - 5 %   Eosinophils Absolute 0.1  0.0 - 0.7 K/uL   Basophils Relative 1  0 - 1 %   Basophils Absolute 0.1  0.0 - 0.1 K/uL  COMPREHENSIVE METABOLIC PANEL      Result Value Range   Sodium 136  135 - 145 mEq/L   Potassium 4.0  3.5 - 5.1 mEq/L   Chloride 102  96 - 112 mEq/L   CO2 23  19 - 32 mEq/L   Glucose, Bld 413 (*) 70 - 99 mg/dL   BUN 12  6 - 23 mg/dL   Creatinine, Ser 7.82  0.50 - 1.35 mg/dL    Calcium 95.6  8.4 - 10.5 mg/dL   Total Protein 6.8  6.0 - 8.3 g/dL   Albumin 3.7  3.5 - 5.2 g/dL  AST 25  0 - 37 U/L   ALT 32  0 - 53 U/L   Alkaline Phosphatase 120 (*) 39 - 117 U/L   Total Bilirubin 0.3  0.3 - 1.2 mg/dL   GFR calc non Af Amer >90  >90 mL/min   GFR calc Af Amer >90  >90 mL/min  TROPONIN I      Result Value Range   Troponin I <0.30  <0.30 ng/mL  PRO B NATRIURETIC PEPTIDE      Result Value Range   Pro B Natriuretic peptide (BNP) 17.9  0 - 125 pg/mL  D-DIMER, QUANTITATIVE      Result Value Range   D-Dimer, Quant <0.27  0.00 - 0.48 ug/mL-FEU  GLUCOSE, CAPILLARY      Result Value Range   Glucose-Capillary 231 (*) 70 - 99 mg/dL   Dg Chest 2 View  1/61/0960   *RADIOLOGY REPORT*  Clinical Data: Dyspnea.  CHEST - 2 VIEW  Comparison: None.  Findings: The lungs are clear.  No edema, infiltrate, nodule or pleural fluid is identified.  The cardiac and mediastinal contours are within normal limits.  Bony thorax shows mild degenerative changes.  IMPRESSION: No active disease.  Clinically significant discrepancy from primary report, if provided: None   Original Report Authenticated By: Irish Lack, M.D.   Dg Chest Port 1 View  11/01/2012   *RADIOLOGY REPORT*  Clinical Data: Shortness of breath.  CHEST - 1 VIEW  Comparison:  10/31/2012 chest x-ray at [facility].  Findings: The heart size and mediastinal contours are within normal limits.  Both lungs are clear.  IMPRESSION: No active disease.   Original Report Authenticated By: Irish Lack, M.D.       No diagnosis found.  MDM    .jk The chart was scribed for me under my direct supervision.  I personally performed the history, physical, and medical decision making and all procedures in the evaluation of this patient.Philip Lennert, MD 11/01/12 [phone]

## 2012-11-01 NOTE — ED Notes (Signed)
Pt states if he did not feel so " fuzzy "  And weak he would go home. States he does feel a little anxious

## 2012-11-01 NOTE — ED Notes (Signed)
Dr Zammit at bedside. 

## 2012-11-01 NOTE — ED Notes (Signed)
Pt states he just feels tired. States he feels like he can't even pick up his arm. Denies chest pain or other symptoms.

## 2012-11-01 NOTE — ED Notes (Signed)
Pt states he just feels weak. States his heart rate is elevated

## 2012-11-03 NOTE — Progress Notes (Signed)
Quick Note:  Labs abnormal. HGBA1C was very high. He was started back on meds as we had done and he should follow up soon. ______

## 2012-11-20 ENCOUNTER — Encounter: Payer: Self-pay | Admitting: *Deleted

## 2012-12-01 ENCOUNTER — Ambulatory Visit: Payer: BC Managed Care – PPO | Admitting: Family Medicine

## 2012-12-22 ENCOUNTER — Encounter: Payer: Self-pay | Admitting: Family Medicine

## 2012-12-22 ENCOUNTER — Ambulatory Visit (INDEPENDENT_AMBULATORY_CARE_PROVIDER_SITE_OTHER): Payer: BC Managed Care – PPO | Admitting: Family Medicine

## 2012-12-22 VITALS — BP 140/82 | HR 104 | Temp 98.2°F | Wt 229.0 lb

## 2012-12-22 DIAGNOSIS — E119 Type 2 diabetes mellitus without complications: Secondary | ICD-10-CM

## 2012-12-22 DIAGNOSIS — R0602 Shortness of breath: Secondary | ICD-10-CM

## 2012-12-22 DIAGNOSIS — F410 Panic disorder [episodic paroxysmal anxiety] without agoraphobia: Secondary | ICD-10-CM

## 2012-12-22 MED ORDER — SERTRALINE HCL 100 MG PO TABS: 100.0000 mg | ORAL_TABLET | Freq: Every day | ORAL | Status: DC

## 2012-12-22 MED ORDER — SITAGLIPTIN PHOSPHATE 100 MG PO TABS: 100.0000 mg | ORAL_TABLET | Freq: Every day | ORAL | Status: DC

## 2012-12-22 NOTE — Patient Instructions (Signed)
      Dr Deandrea Vanpelt's Recommendations  For nutrition information, I recommend books:  1).Eat to Live by Dr Joel Fuhrman. 2).Prevent and Reverse Heart Disease by Dr Caldwell Esselstyn. 3) Dr Neal Barnard's Book:  Program to Reverse Diabetes  Exercise recommendations are:  If unable to walk, then the patient can exercise in a chair 3 times a day. By flapping arms like a bird gently and raising legs outwards to the front.  If ambulatory, the patient can go for walks for 30 minutes 3 times a week. Then increase the intensity and duration as tolerated.  Goal is to try to attain exercise frequency to 5 times a week.  If applicable: Best to perform resistance exercises (machines or weights) 2 days a week and cardio type exercises 3 days per week.  

## 2012-12-22 NOTE — Progress Notes (Signed)
Patient ID: Philip [name], male   DOB: 09-24-1950, 62 y.o.   MRN: [id number] SUBJECTIVE: CC: Chief Complaint  Patient presents with  . Follow-up    4 week     HPI: Patient is here for follow up of Diabetes Mellitus: Symptoms evaluated: Denies Nocturia ,Denies Urinary Frequency , denies Blurred vision ,deniesDizziness,denies.Dysuria,denies paresthesias, denies extremity pain or ulcers.Marland Kitchendenies chest pain. has had an annual eye exam. do check the feet. Does check CBGs. Average YQM:VHQIONGEXB 120s to 260s no longer in the 400s Denies episodes of hypoglycemia. Does have an emergency hypoglycemic plan. admits toCompliance with medications. Denies Problems with medications.  Anxious still about his health and worries a lot.  No Chest pain. Past Medical History  Diagnosis Date  . Chest pain   . Diabetes mellitus without complication   . Hypertension    Past Surgical History  Procedure Laterality Date  . Back surgery     History   Social History  . Marital Status: Single    Spouse Name: N/A    Number of Children: N/A  . Years of Education: N/A   Occupational History  . Not on file.   Social History Main Topics  . Smoking status: Former Games developer  . Smokeless tobacco: Not on file  . Alcohol Use: No  . Drug Use: No  . Sexual Activity: Not on file   Other Topics Concern  . Not on file   Social History Narrative  . No narrative on file   No family history on file. Current Outpatient Prescriptions on File Prior to Visit  Medication Sig Dispense Refill  . amLODipine (NORVASC) 10 MG tablet Take 1 tablet (10 mg total) by mouth daily.  30 tablet  2  . aspirin 325 MG tablet Take 325 mg by mouth every 6 (six) hours as needed for pain.       . metFORMIN (GLUCOPHAGE) 1000 MG tablet Take 1 tablet (1,000 mg total) by mouth 2 (two) times daily with a meal.  60 tablet  2  . nitroGLYCERIN (NITROSTAT) 0.4 MG SL tablet Place 0.4 mg under the tongue every 5 (five) minutes as needed for  chest pain.       Marland Kitchen sertraline (ZOLOFT) 50 MG tablet Take 1 tablet (50 mg total) by mouth daily.  30 tablet  3   No current facility-administered medications on file prior to visit.   Allergies  Allergen Reactions  . Ativan [Lorazepam]   . Fish Oil   . Shellfish Allergy   . Ultram [Tramadol Hcl]     There is no immunization history on file for this patient. Prior to Admission medications   Medication Sig Start Date End Date Taking? Authorizing Provider  amLODipine (NORVASC) 10 MG tablet Take 1 tablet (10 mg total) by mouth daily. 10/30/12  Yes Ileana Ladd, MD  aspirin 325 MG tablet Take 325 mg by mouth every 6 (six) hours as needed for pain.    Yes Historical Provider, MD  metFORMIN (GLUCOPHAGE) 1000 MG tablet Take 1 tablet (1,000 mg total) by mouth 2 (two) times daily with a meal. 10/30/12  Yes Ileana Ladd, MD  nitroGLYCERIN (NITROSTAT) 0.4 MG SL tablet Place 0.4 mg under the tongue every 5 (five) minutes as needed for chest pain.    Yes Historical Provider, MD  sertraline (ZOLOFT) 50 MG tablet Take 1 tablet (50 mg total) by mouth daily. 10/30/12  Yes Ileana Ladd, MD     ROS: As above in the HPI. All other  systems are stable or negative.  OBJECTIVE: APPEARANCE:  Patient in no acute distress.The patient appeared well nourished and normally developed. Acyanotic. Waist: VITAL SIGNS:BP 140/82  Pulse 104  Temp(Src) 98.2 F (36.8 C) (Oral)  Wt 229 lb (103.874 kg)  BMI 33.8 kg/m2  WM obese looks better His CBGs are better now but not precisely on target.  SKIN: warm and  Dry without overt rashes, tattoos and scars  HEAD and Neck: without JVD, Head and scalp: normal Eyes:No scleral icterus. Fundi normal, eye movements normal. Ears: Auricle normal, canal normal, Tympanic membranes normal, insufflation normal. Nose: normal Throat: normal Neck & thyroid: normal  CHEST & LUNGS: Chest wall: normal Lungs: Clear  CVS: Reveals the PMI to be normally located. Regular  rhythm, First and Second Heart sounds are normal,  absence of murmurs, rubs or gallops. Peripheral vasculature: Radial pulses: normal Dorsal pedis pulses: normal Posterior pulses: normal  ABDOMEN:  Appearance: obese Benign, no organomegaly, no masses, no Abdominal Aortic enlargement. No Guarding , no rebound. No Bruits. Bowel sounds: normal  RECTAL: N/A GU: N/A  EXTREMETIES: nonedematous.  MUSCULOSKELETAL:  Spine: normal Joints: intact  NEUROLOGIC: oriented to time,place and person; nonfocal. Strength is normal Sensory is normal Reflexes are normal Cranial Nerves are normal.   Psychiatry: patient very anxious about his health even though he is making good progress finally.  Results for orders placed during the hospital encounter of 11/01/12  CBC WITH DIFFERENTIAL      Result Value Range   WBC 8.0  4.0 - 10.5 K/uL   RBC 4.88  4.22 - 5.81 MIL/uL   Hemoglobin 15.3  13.0 - 17.0 g/dL   HCT 44.0  10.2 - 72.5 %   MCV 87.9  78.0 - 100.0 fL   MCH 31.4  26.0 - 34.0 pg   MCHC 35.7  30.0 - 36.0 g/dL   RDW 36.6  44.0 - 34.7 %   Platelets 244  150 - 400 K/uL   Neutrophils Relative % 46  43 - 77 %   Neutro Abs 3.7  1.7 - 7.7 K/uL   Lymphocytes Relative 42  12 - 46 %   Lymphs Abs 3.4  0.7 - 4.0 K/uL   Monocytes Relative 9  3 - 12 %   Monocytes Absolute 0.8  0.1 - 1.0 K/uL   Eosinophils Relative 2  0 - 5 %   Eosinophils Absolute 0.1  0.0 - 0.7 K/uL   Basophils Relative 1  0 - 1 %   Basophils Absolute 0.1  0.0 - 0.1 K/uL  COMPREHENSIVE METABOLIC PANEL      Result Value Range   Sodium 136  135 - 145 mEq/L   Potassium 4.0  3.5 - 5.1 mEq/L   Chloride 102  96 - 112 mEq/L   CO2 23  19 - 32 mEq/L   Glucose, Bld 413 (*) 70 - 99 mg/dL   BUN 12  6 - 23 mg/dL   Creatinine, Ser 4.25  0.50 - 1.35 mg/dL   Calcium 95.6  8.4 - 38.7 mg/dL   Total Protein 6.8  6.0 - 8.3 g/dL   Albumin 3.7  3.5 - 5.2 g/dL   AST 25  0 - 37 U/L   ALT 32  0 - 53 U/L   Alkaline Phosphatase 120 (*) 39 -  117 U/L   Total Bilirubin 0.3  0.3 - 1.2 mg/dL   GFR calc non Af Amer >90  >90 mL/min   GFR calc Af Amer >90  >  90 mL/min  TROPONIN I      Result Value Range   Troponin I <0.30  <0.30 ng/mL  PRO B NATRIURETIC PEPTIDE      Result Value Range   Pro B Natriuretic peptide (BNP) 17.9  0 - 125 pg/mL  D-DIMER, QUANTITATIVE      Result Value Range   D-Dimer, Quant <0.27  0.00 - 0.48 ug/mL-FEU  GLUCOSE, CAPILLARY      Result Value Range   Glucose-Capillary 231 (*) 70 - 99 mg/dL    ASSESSMENT: Shortness of breath - Plan: sertraline (ZOLOFT) 100 MG tablet  Panic disorder - Plan: sertraline (ZOLOFT) 100 MG tablet  DM (diabetes mellitus) - Plan: sitaGLIPtin (JANUVIA) 100 MG tablet  He actually has a neurosis about his health and may need to see a Psychiatrist if medication adjustment is not successful.   PLAN: Medications Discontinued During This Encounter  Medication Reason  . sertraline (ZOLOFT) 50 MG tablet Reorder    Meds ordered this encounter  Medications  . sertraline (ZOLOFT) 100 MG tablet    Sig: Take 1 tablet (100 mg total) by mouth daily.    Dispense:  30 tablet    Refill:  3  . sitaGLIPtin (JANUVIA) 100 MG tablet    Sig: Take 1 tablet (100 mg total) by mouth daily.    Dispense:  30 tablet    Refill:  5   Results for orders placed during the hospital encounter of 11/01/12  CBC WITH DIFFERENTIAL      Result Value Range   WBC 8.0  4.0 - 10.5 K/uL   RBC 4.88  4.22 - 5.81 MIL/uL   Hemoglobin 15.3  13.0 - 17.0 g/dL   HCT 45.4  09.8 - 11.9 %   MCV 87.9  78.0 - 100.0 fL   MCH 31.4  26.0 - 34.0 pg   MCHC 35.7  30.0 - 36.0 g/dL   RDW 14.7  82.9 - 56.2 %   Platelets 244  150 - 400 K/uL   Neutrophils Relative % 46  43 - 77 %   Neutro Abs 3.7  1.7 - 7.7 K/uL   Lymphocytes Relative 42  12 - 46 %   Lymphs Abs 3.4  0.7 - 4.0 K/uL   Monocytes Relative 9  3 - 12 %   Monocytes Absolute 0.8  0.1 - 1.0 K/uL   Eosinophils Relative 2  0 - 5 %   Eosinophils Absolute 0.1  0.0 -  0.7 K/uL   Basophils Relative 1  0 - 1 %   Basophils Absolute 0.1  0.0 - 0.1 K/uL  COMPREHENSIVE METABOLIC PANEL      Result Value Range   Sodium 136  135 - 145 mEq/L   Potassium 4.0  3.5 - 5.1 mEq/L   Chloride 102  96 - 112 mEq/L   CO2 23  19 - 32 mEq/L   Glucose, Bld 413 (*) 70 - 99 mg/dL   BUN 12  6 - 23 mg/dL   Creatinine, Ser 1.30  0.50 - 1.35 mg/dL   Calcium 86.5  8.4 - 78.4 mg/dL   Total Protein 6.8  6.0 - 8.3 g/dL   Albumin 3.7  3.5 - 5.2 g/dL   AST 25  0 - 37 U/L   ALT 32  0 - 53 U/L   Alkaline Phosphatase 120 (*) 39 - 117 U/L   Total Bilirubin 0.3  0.3 - 1.2 mg/dL   GFR calc non Af Amer >90  >90 mL/min  GFR calc Af Amer >90  >90 mL/min  TROPONIN I      Result Value Range   Troponin I <0.30  <0.30 ng/mL  PRO B NATRIURETIC PEPTIDE      Result Value Range   Pro B Natriuretic peptide (BNP) 17.9  0 - 125 pg/mL  D-DIMER, QUANTITATIVE      Result Value Range   D-Dimer, Quant <0.27  0.00 - 0.48 ug/mL-FEU  GLUCOSE, CAPILLARY      Result Value Range   Glucose-Capillary 231 (*) 70 - 99 mg/dL         Dr Woodroe Mode Recommendations  For nutrition information, I recommend books:  1).Eat to Live by Dr Monico Hoar. 2).Prevent and Reverse Heart Disease by Dr Suzzette Righter. 3) Dr Katherina Right Book:  Program to Reverse Diabetes  Exercise recommendations are:  If unable to walk, then the patient can exercise in a chair 3 times a day. By flapping arms like a bird gently and raising legs outwards to the front.  If ambulatory, the patient can go for walks for 30 minutes 3 times a week. Then increase the intensity and duration as tolerated.  Goal is to try to attain exercise frequency to 5 times a week.  If applicable: Best to perform resistance exercises (machines or weights) 2 days a week and cardio type exercises 3 days per week.  Return in about 2 weeks (around 01/05/2013) for Tammy in 2 weeks for DM education and 6 weeks with Dr Modesto Charon.  Kaegan Stigler P. Modesto Charon,  M.D.

## 2013-01-22 ENCOUNTER — Ambulatory Visit (INDEPENDENT_AMBULATORY_CARE_PROVIDER_SITE_OTHER): Payer: BC Managed Care – PPO | Admitting: Pharmacist

## 2013-01-22 VITALS — BP 158/80 | HR 88 | Ht 69.0 in | Wt 227.0 lb

## 2013-01-22 DIAGNOSIS — IMO0001 Reserved for inherently not codable concepts without codable children: Secondary | ICD-10-CM

## 2013-01-22 DIAGNOSIS — I10 Essential (primary) hypertension: Secondary | ICD-10-CM

## 2013-01-22 MED ORDER — CANAGLIFLOZIN 100 MG PO TABS: 100.0000 mg | ORAL_TABLET | Freq: Every morning | ORAL | Status: DC

## 2013-01-22 NOTE — Progress Notes (Signed)
Diabetes Follow-Up Visit Chief Complaint:   Chief Complaint  Patient presents with  . Diabetes     Filed Vitals:   01/22/13 1418  BP: 158/80  Pulse: 88   Filed Weights   01/22/13 1418  Weight: 227 lb (102.967 kg)      HPI: patient was initially diagnosis with type 2 DM about 2 years ago.  He was previously treated with metformin with adequate control but ran out of medication a few months ago and BG was very poorly controlled.  Required visit to ER.    Current Diabetes Medications:  metformin (generic) 1000mg  BID and Januvia 100mg  daily  Exam Edema:  negative  Polyuria:  negatvie  Polydipsia:  positive Polyphagia:  negative  BMI:  Body mass index is 33.51 kg/(m^2).   Weight changes:  Decreased about 10# compared to 02/2012 General Appearance:  alert, oriented, no acute distress and obese Mood/Affect:  normal   Low fat/carbohydrate diet?  No  Eating a lots of banana sandwiches and pinto beans Nicotine Abuse?  No Medication Compliance?  Yes Exercise?  No - limited by chronic back pain due herniated disc and history of back surgery 1999. Alcohol Abuse?  No  Home BG Monitoring:  Checking 2 times a day. Average:  175  High: 286  Low:  102    Lab Results  Component Value Date   HGBA1C 11.9% 10/31/2012    No results found for this basename: MICROALBUR,  MALB24HUR    No results found for this basename: CHOL,  HDL,  LDLCALC,  LDLDIRECT,  TRIG,  CHOLHDL      Assessment: 1.  Diabetes.  Uncontrolled but much improved compared to 3 months ago 2.  Blood Pressure.  Elevated though patient states BP at home if 140/80 3.  Lipids.  Needed - plan to get at next visit when patient is fasting   Recommendations: 1.  Medication recommendations at this time are as follows:   Continue metformin 1000mg  bid  Start Invokana 100mg  qd - gave #10 samples and coupon for #30 free.  Also gave copay assistance card.  Plan to recheck Bmet in 1 month - if stable then will increase Invokana to  300mg  daily.  Stop januvia due to cost ($50 per month) 2.  Reviewed HBG goals:  Fasting 80-130 and 1-2 hour post prandial <180.  Patient is instructed to check BG 2 times per day.    3.  BP goal < 140/80. 4.  LDL goal of < 100, HDL > 40 and TG < 150. 5.  Eye Exam yearly and Dental Exam every 6 months. 6.  Dietary recommendations:  Reviewed CHO counts of food and serving size recommendations.  Patient encouraged to increase non starchy vegetables, whole grains (1/2 cup) and fresh fruits.   7.  Physical Activity recommendations:  Increase as able   8.  Return to clinic in 4-6 wks   Time spent counseling patient:  60 minutes  Referring provider:  Pam Drown, PharmD, CPP

## 2013-02-02 ENCOUNTER — Ambulatory Visit (INDEPENDENT_AMBULATORY_CARE_PROVIDER_SITE_OTHER): Payer: BC Managed Care – PPO | Admitting: Family Medicine

## 2013-02-02 ENCOUNTER — Encounter: Payer: Self-pay | Admitting: Family Medicine

## 2013-02-02 VITALS — BP 128/76 | HR 107 | Temp 97.7°F | Ht 69.0 in | Wt 222.0 lb

## 2013-02-02 DIAGNOSIS — IMO0001 Reserved for inherently not codable concepts without codable children: Secondary | ICD-10-CM

## 2013-02-02 DIAGNOSIS — F410 Panic disorder [episodic paroxysmal anxiety] without agoraphobia: Secondary | ICD-10-CM

## 2013-02-02 DIAGNOSIS — E785 Hyperlipidemia, unspecified: Secondary | ICD-10-CM

## 2013-02-02 DIAGNOSIS — I10 Essential (primary) hypertension: Secondary | ICD-10-CM

## 2013-02-02 LAB — POCT UA - MICROALBUMIN: Microalbumin Ur, POC: NEGATIVE mg/L

## 2013-02-02 LAB — POCT GLYCOSYLATED HEMOGLOBIN (HGB A1C): Hemoglobin A1C: 7

## 2013-02-02 MED ORDER — AMLODIPINE BESYLATE 10 MG PO TABS: 10.0000 mg | ORAL_TABLET | Freq: Every day | ORAL | Status: DC

## 2013-02-02 NOTE — Progress Notes (Signed)
Patient ID: Philip [name], male   DOB: [birth year], 62 y.o.   MRN: [id number] SUBJECTIVE: CC: Chief Complaint  Patient presents with  . Follow-up    6 week follow up now on invokana     HPI: Patient is here for follow up of Diabetes Mellitus: Symptoms evaluated: Denies Nocturia ,Denies Urinary Frequency , denies Blurred vision ,deniesDizziness,denies.Dysuria,denies paresthesias, denies extremity pain or ulcers.Marland Kitchendenies chest pain. has had an annual eye exam. do check the feet. Does check CBGs. Average CBG:130-150 Denies episodes of hypoglycemia. Does have an emergency hypoglycemic plan. admits toCompliance with medications. Denies Problems with medications. Invokana has worked well for him. Feels like he is doing better. Really liked the classes with Tammy.  Past Medical History  Diagnosis Date  . Chest pain   . Diabetes mellitus without complication   . Hypertension    Past Surgical History  Procedure Laterality Date  . Back surgery  1999    hernitated and ruptured discs   History   Social History  . Marital Status: Single    Spouse Name: N/A    Number of Children: N/A  . Years of Education: N/A   Occupational History  . Not on file.   Social History Main Topics  . Smoking status: Former Games developer  . Smokeless tobacco: Not on file  . Alcohol Use: No  . Drug Use: No  . Sexual Activity: Not on file   Other Topics Concern  . Not on file   Social History Narrative  . No narrative on file   No family history on file. Current Outpatient Prescriptions on File Prior to Visit  Medication Sig Dispense Refill  . aspirin 325 MG tablet Take 325 mg by mouth every 6 (six) hours as needed for pain.       . Canagliflozin 100 MG TABS Take 1 tablet (100 mg total) by mouth every morning.  30 tablet  0  . metFORMIN (GLUCOPHAGE) 1000 MG tablet Take 1 tablet (1,000 mg total) by mouth 2 (two) times daily with a meal.  60 tablet  2  . nitroGLYCERIN (NITROSTAT) 0.4 MG SL tablet Place  0.4 mg under the tongue every 5 (five) minutes as needed for chest pain.       Marland Kitchen sertraline (ZOLOFT) 100 MG tablet Take 1 tablet (100 mg total) by mouth daily.  30 tablet  3   No current facility-administered medications on file prior to visit.   Allergies  Allergen Reactions  . Ativan [Lorazepam] Other (See Comments)    Caused patient not to remember several days.  . Fish Oil   . Shellfish Allergy   . Ultram [Tramadol Hcl] Hives    There is no immunization history on file for this patient. Prior to Admission medications   Medication Sig Start Date End Date Taking? Authorizing Provider  amLODipine (NORVASC) 10 MG tablet Take 1 tablet (10 mg total) by mouth daily. 10/30/12   Ileana Ladd, MD  aspirin 325 MG tablet Take 325 mg by mouth every 6 (six) hours as needed for pain.     Historical Provider, MD  Canagliflozin 100 MG TABS Take 1 tablet (100 mg total) by mouth every morning. 01/22/13   Tammy Eckard, PHARMD  metFORMIN (GLUCOPHAGE) 1000 MG tablet Take 1 tablet (1,000 mg total) by mouth 2 (two) times daily with a meal. 10/30/12   Ileana Ladd, MD  nitroGLYCERIN (NITROSTAT) 0.4 MG SL tablet Place 0.4 mg under the tongue every 5 (five) minutes as needed for chest  pain.     Historical Provider, MD  sertraline (ZOLOFT) 100 MG tablet Take 1 tablet (100 mg total) by mouth daily. 12/22/12   Ileana Ladd, MD     ROS: As above in the HPI. All other systems are stable or negative.  OBJECTIVE: APPEARANCE:  Patient in no acute distress.The patient appeared well nourished and normally developed. Acyanotic. Waist: VITAL SIGNS:BP 128/76  Pulse 107  Temp(Src) 97.7 F (36.5 C) (Oral)  Ht 5\' 9"  (1.753 m)  Wt 222 lb (100.699 kg)  BMI 32.77 kg/m2  WM obese. He has lost weight though through diet and exercise  SKIN: warm and  Dry without overt rashes, tattoos and scars  HEAD and Neck: without JVD, Head and scalp: normal Eyes:No scleral icterus. Fundi normal, eye movements normal. Ears:  Auricle normal, canal normal, Tympanic membranes normal, insufflation normal. Nose: normal Throat: normal Neck & thyroid: normal  CHEST & LUNGS: Chest wall: normal Lungs: Clear  CVS: Reveals the PMI to be normally located. Regular rhythm, First and Second Heart sounds are normal,  absence of murmurs, rubs or gallops. Peripheral vasculature: Radial pulses: normal Dorsal pedis pulses: normal Posterior pulses: normal  ABDOMEN:  Appearance: less obese Benign, no organomegaly, no masses, no Abdominal Aortic enlargement. No Guarding , no rebound. No Bruits. Bowel sounds: normal  RECTAL: N/A GU: N/A  EXTREMETIES: nonedematous.  MUSCULOSKELETAL:  Spine: normal Joints: intact  NEUROLOGIC: oriented to time,place and person; nonfocal. Strength is normal Sensory is normal Reflexes are normal Cranial Nerves are normal.  ASSESSMENT: Hypertension - Plan: amLODipine (NORVASC) 10 MG tablet  Panic disorder  Type II or unspecified type diabetes mellitus without mention of complication, uncontrolled - Plan: POCT glycosylated hemoglobin (Hb A1C), POCT UA - Microalbumin, CMP14+EGFR  Dyslipidemia - Plan: CMP14+EGFR, NMR, lipoprofile  PLAN:  Orders Placed This Encounter  Procedures  . CMP14+EGFR  . NMR, lipoprofile  . POCT glycosylated hemoglobin (Hb A1C)  . POCT UA - Microalbumin   Meds ordered this encounter  Medications  . amLODipine (NORVASC) 10 MG tablet    Sig: Take 1 tablet (10 mg total) by mouth daily.    Dispense:  30 tablet    Refill:  11   Medications Discontinued During This Encounter  Medication Reason  . amLODipine (NORVASC) 10 MG tablet Reorder   Return in about 3 months (around 05/05/2013) for Recheck medical problems.  Cythia Bachtel P. Modesto Charon, M.D.

## 2013-02-05 LAB — CMP14+EGFR
ALT: 32 IU/L (ref 0–44)
AST: 36 IU/L (ref 0–40)
Albumin/Globulin Ratio: 2 (ref 1.1–2.5)
Albumin: 4.5 g/dL (ref 3.6–4.8)
Alkaline Phosphatase: 104 IU/L (ref 39–117)
BUN/Creatinine Ratio: 15 (ref 10–22)
BUN: 13 mg/dL (ref 8–27)
CO2: 22 mmol/L (ref 18–29)
Calcium: 10.8 mg/dL — ABNORMAL HIGH (ref 8.6–10.2)
Chloride: 100 mmol/L (ref 97–108)
Creatinine, Ser: 0.86 mg/dL (ref 0.76–1.27)
GFR calc Af Amer: 108 mL/min/{1.73_m2} (ref >59)
GFR calc non Af Amer: 94 mL/min/{1.73_m2} (ref >59)
Globulin, Total: 2.3 g/dL (ref 1.5–4.5)
Glucose: 149 mg/dL — ABNORMAL HIGH (ref 65–99)
Potassium: 4.6 mmol/L (ref 3.5–5.2)
Sodium: 141 mmol/L (ref 134–144)
Total Bilirubin: 0.4 mg/dL (ref 0.0–1.2)
Total Protein: 6.8 g/dL (ref 6.0–8.5)

## 2013-02-05 LAB — NMR, LIPOPROFILE
Cholesterol: 269 mg/dL — ABNORMAL HIGH (ref <200)
HDL Cholesterol by NMR: 45 mg/dL (ref >=40)
HDL Particle Number: 31.3 umol/L (ref >=30.5)
LDL Particle Number: 2477 nmol/L — ABNORMAL HIGH (ref <1000)
LDL Size: 20.3 nm — ABNORMAL LOW (ref >20.5)
LDLC SERPL CALC-MCNC: 182 mg/dL — ABNORMAL HIGH (ref <100)
LP-IR Score: 68 — ABNORMAL HIGH (ref <=45)
Small LDL Particle Number: 1441 nmol/L — ABNORMAL HIGH (ref <=527)
Triglycerides by NMR: 212 mg/dL — ABNORMAL HIGH (ref <150)

## 2013-02-21 ENCOUNTER — Telehealth: Payer: Self-pay | Admitting: Family Medicine

## 2013-02-21 DIAGNOSIS — IMO0001 Reserved for inherently not codable concepts without codable children: Secondary | ICD-10-CM

## 2013-02-23 MED ORDER — CANAGLIFLOZIN 100 MG PO TABS: 100.0000 mg | ORAL_TABLET | Freq: Every morning | ORAL | Status: DC

## 2013-02-23 MED ORDER — METFORMIN HCL 1000 MG PO TABS: 1000.0000 mg | ORAL_TABLET | Freq: Two times a day (BID) | ORAL | Status: DC

## 2013-02-23 NOTE — Telephone Encounter (Signed)
done

## 2013-02-26 ENCOUNTER — Telehealth: Payer: Self-pay | Admitting: Family Medicine

## 2013-02-26 NOTE — Telephone Encounter (Signed)
Called number provided and says "number no longer in service

## 2013-02-27 ENCOUNTER — Other Ambulatory Visit: Payer: Self-pay

## 2013-02-27 DIAGNOSIS — IMO0001 Reserved for inherently not codable concepts without codable children: Secondary | ICD-10-CM

## 2013-02-27 MED ORDER — CANAGLIFLOZIN 100 MG PO TABS: 100.0000 mg | ORAL_TABLET | Freq: Every morning | ORAL | Status: DC

## 2013-02-27 NOTE — Telephone Encounter (Signed)
Unable to reach pt via phone . Letter on last labs 02-02-13 sent

## 2013-03-05 ENCOUNTER — Ambulatory Visit: Payer: Self-pay

## 2013-05-08 ENCOUNTER — Encounter: Payer: Self-pay | Admitting: Family Medicine

## 2013-05-08 ENCOUNTER — Ambulatory Visit (INDEPENDENT_AMBULATORY_CARE_PROVIDER_SITE_OTHER): Payer: BC Managed Care – PPO | Admitting: Family Medicine

## 2013-05-08 VITALS — BP 148/93 | HR 95 | Temp 98.0°F | Ht 69.0 in | Wt 218.2 lb

## 2013-05-08 DIAGNOSIS — E785 Hyperlipidemia, unspecified: Secondary | ICD-10-CM

## 2013-05-08 DIAGNOSIS — E1165 Type 2 diabetes mellitus with hyperglycemia: Principal | ICD-10-CM

## 2013-05-08 DIAGNOSIS — I10 Essential (primary) hypertension: Secondary | ICD-10-CM

## 2013-05-08 DIAGNOSIS — F410 Panic disorder [episodic paroxysmal anxiety] without agoraphobia: Secondary | ICD-10-CM

## 2013-05-08 DIAGNOSIS — IMO0001 Reserved for inherently not codable concepts without codable children: Secondary | ICD-10-CM

## 2013-05-08 DIAGNOSIS — R0602 Shortness of breath: Secondary | ICD-10-CM

## 2013-05-08 LAB — POCT GLYCOSYLATED HEMOGLOBIN (HGB A1C): Hemoglobin A1C: 6.5

## 2013-05-08 MED ORDER — LISINOPRIL 10 MG PO TABS: 10.0000 mg | ORAL_TABLET | Freq: Every day | ORAL | Status: DC

## 2013-05-08 MED ORDER — AMLODIPINE BESYLATE 10 MG PO TABS: 10.0000 mg | ORAL_TABLET | Freq: Every day | ORAL | Status: DC

## 2013-05-08 MED ORDER — METFORMIN HCL 1000 MG PO TABS: 1000.0000 mg | ORAL_TABLET | Freq: Two times a day (BID) | ORAL | Status: DC

## 2013-05-08 MED ORDER — SERTRALINE HCL 100 MG PO TABS: 100.0000 mg | ORAL_TABLET | Freq: Every day | ORAL | Status: DC

## 2013-05-08 MED ORDER — CANAGLIFLOZIN 100 MG PO TABS: 100.0000 mg | ORAL_TABLET | Freq: Every morning | ORAL | Status: DC

## 2013-05-08 NOTE — Progress Notes (Signed)
Patient ID: Philip [name], male   DOB: 05-Jan-1951, 63 y.o.   MRN: [id number] SUBJECTIVE: CC: Chief Complaint  Patient presents with  . Follow-up    3 month follow up chronic problelms  wants 90 day supply     HPI: Patient is here for follow up of Diabetes Mellitus/HLD/HTN: Symptoms evaluated: Denies Nocturia ,Denies Urinary Frequency , denies Blurred vision ,deniesDizziness,denies.Dysuria,denies paresthesias, denies extremity pain or ulcers.Marland Kitchendenies chest pain. has had an annual eye exam. do check the feet. Does check CBGs. Average CBG: was doing good around 150s now spiking up to 265 especially after missing a meal. Diet has been about the same. Occasionally misses a meal. Denies episodes of hypoglycemia. Does have an emergency hypoglycemic plan. admits toCompliance with medications. Denies Problems with medications.  Past Medical History  Diagnosis Date  . Chest pain   . Diabetes mellitus without complication   . Hypertension    Past Surgical History  Procedure Laterality Date  . Back surgery  1999    hernitated and ruptured discs   History   Social History  . Marital Status: Single    Spouse Name: N/A    Number of Children: N/A  . Years of Education: N/A   Occupational History  . Not on file.   Social History Main Topics  . Smoking status: Former Research scientist (life sciences)  . Smokeless tobacco: Not on file  . Alcohol Use: No  . Drug Use: No  . Sexual Activity: Not on file   Other Topics Concern  . Not on file   Social History Narrative  . No narrative on file   No family history on file. Current Outpatient Prescriptions on File Prior to Visit  Medication Sig Dispense Refill  . aspirin 325 MG tablet Take 325 mg by mouth every 6 (six) hours as needed for pain.       . nitroGLYCERIN (NITROSTAT) 0.4 MG SL tablet Place 0.4 mg under the tongue every 5 (five) minutes as needed for chest pain.        No current facility-administered medications on file prior to visit.   Allergies   Allergen Reactions  . Ativan [Lorazepam] Other (See Comments)    Caused patient not to remember several days.  . Fish Oil   . Shellfish Allergy   . Ultram [Tramadol Hcl] Hives    There is no immunization history on file for this patient. Prior to Admission medications   Medication Sig Start Date End Date Taking? Authorizing Provider  amLODipine (NORVASC) 10 MG tablet Take 1 tablet (10 mg total) by mouth daily. 02/02/13   Vernie Shanks, MD  aspirin 325 MG tablet Take 325 mg by mouth every 6 (six) hours as needed for pain.     Historical Provider, MD  Canagliflozin 100 MG TABS Take 1 tablet (100 mg total) by mouth every morning. 02/27/13   Chipper Herb, MD  metFORMIN (GLUCOPHAGE) 1000 MG tablet Take 1 tablet (1,000 mg total) by mouth 2 (two) times daily with a meal. 02/23/13   Vernie Shanks, MD  nitroGLYCERIN (NITROSTAT) 0.4 MG SL tablet Place 0.4 mg under the tongue every 5 (five) minutes as needed for chest pain.     Historical Provider, MD  sertraline (ZOLOFT) 100 MG tablet Take 1 tablet (100 mg total) by mouth daily. 12/22/12   Vernie Shanks, MD     ROS: As above in the HPI. All other systems are stable or negative.  OBJECTIVE: APPEARANCE:  Patient in no acute distress.The patient  appeared well nourished and normally developed. Acyanotic. Waist: VITAL SIGNS:BP 148/93  Pulse 95  Temp(Src) 98 F (36.7 C) (Oral)  Ht _0  (1.753 m)  Wt 218 lb 3.2 oz (98.975 kg)  BMI 32.21 kg/m2 BP 140/82 WM Obese  SKIN: warm and  Dry without overt rashes, tattoos and scars  HEAD and Neck: without JVD, Head and scalp: normal Eyes:No scleral icterus. Fundi normal, eye movements normal. Ears: Auricle normal, canal normal, Tympanic membranes normal, insufflation normal. Nose: normal Throat: normal Neck & thyroid: normal  CHEST & LUNGS: Chest wall: normal Lungs: Clear  CVS: Reveals the PMI to be normally located. Regular rhythm, First and Second Heart sounds are normal,  absence  of murmurs, rubs or gallops. Peripheral vasculature: Radial pulses: normal Dorsal pedis pulses: normal Posterior pulses: normal  ABDOMEN:  Appearance: Obese Benign, no organomegaly, no masses, no Abdominal Aortic enlargement. No Guarding , no rebound. No Bruits. Bowel sounds: normal  RECTAL: N/A GU: N/A  EXTREMETIES: nonedematous.  MUSCULOSKELETAL:  Spine: normal Joints: intact  NEUROLOGIC: oriented to time,place and person; nonfocal. Strength is normal Sensory is normal Reflexes are normal Cranial Nerves are normal.  Results for orders placed in visit on 05/08/13  POCT GLYCOSYLATED HEMOGLOBIN (HGB A1C)      Result Value Range   Hemoglobin A1C 6.5       ASSESSMENT:  Type II or unspecified type diabetes mellitus without mention of complication, uncontrolled - Plan: Canagliflozin 100 MG TABS, metFORMIN (GLUCOPHAGE) 1000 MG tablet, POCT glycosylated hemoglobin (Hb A1C), CMP14+EGFR  Hypertension - Plan: amLODipine (NORVASC) 10 MG tablet, CMP14+EGFR, lisinopril (PRINIVIL,ZESTRIL) 10 MG tablet  Dyslipidemia - Plan: CMP14+EGFR, NMR, lipoprofile  Panic disorder - Plan: sertraline (ZOLOFT) 100 MG tablet  Shortness of breath - Plan: sertraline (ZOLOFT) 100 MG tablet  PLAN: DM foot care in the AVS       Dr Paula Libra Recommendations  For nutrition information, I recommend books:  1).Eat to Live by Dr Excell Seltzer. 2).Prevent and Reverse Heart Disease by Dr Karl Luke. 3) Dr Janene Harvey Book:  Program to Reverse Diabetes  Exercise recommendations are:  If unable to walk, then the patient can exercise in a chair 3 times a day. By flapping arms like a bird gently and raising legs outwards to the front.  If ambulatory, the patient can go for walks for 30 minutes 3 times a week. Then increase the intensity and duration as tolerated.  Goal is to try to attain exercise frequency to 5 times a week.  If applicable: Best to perform resistance exercises  (machines or weights) 2 days a week and cardio type exercises 3 days per week.  Diet exercise and lifestyle changes to incorporate a plant based  Diet discussed.  Weight reduction important.   Orders Placed This Encounter  Procedures  . CMP14+EGFR  . NMR, lipoprofile  . POCT glycosylated hemoglobin (Hb A1C)   Meds ordered this encounter  Medications  . Canagliflozin 100 MG TABS    Sig: Take 1 tablet (100 mg total) by mouth every morning.    Dispense:  90 tablet    Refill:  3  . metFORMIN (GLUCOPHAGE) 1000 MG tablet    Sig: Take 1 tablet (1,000 mg total) by mouth 2 (two) times daily with a meal.    Dispense:  180 tablet    Refill:  3  . amLODipine (NORVASC) 10 MG tablet    Sig: Take 1 tablet (10 mg total) by mouth daily.    Dispense:  90 tablet    Refill:  3  . sertraline (ZOLOFT) 100 MG tablet    Sig: Take 1 tablet (100 mg total) by mouth daily.    Dispense:  90 tablet    Refill:  3  . lisinopril (PRINIVIL,ZESTRIL) 10 MG tablet    Sig: Take 1 tablet (10 mg total) by mouth daily.    Dispense:  90 tablet    Refill:  3   Medications Discontinued During This Encounter  Medication Reason  . Canagliflozin 100 MG TABS Reorder  . metFORMIN (GLUCOPHAGE) 1000 MG tablet Reorder  . amLODipine (NORVASC) 10 MG tablet Reorder  . sertraline (ZOLOFT) 100 MG tablet Reorder   Return in about 6 weeks (around 06/19/2013) for Recheck medical problems.  Karsen Fellows P. Jacelyn Grip, M.D.

## 2013-05-08 NOTE — Patient Instructions (Signed)
Diabetes and Foot Care Diabetes may cause you to have problems because of poor blood supply (circulation) to your feet and legs. This may cause the skin on your feet to become thinner, break easier, and heal more slowly. Your skin may become dry, and the skin may peel and crack. You may also have nerve damage in your legs and feet causing decreased feeling in them. You may not notice minor injuries to your feet that could lead to infections or more serious problems. Taking care of your feet is one of the most important things you can do for yourself.  HOME CARE INSTRUCTIONS  Wear shoes at all times, even in the house. Do not go barefoot. Bare feet are easily injured.  Check your feet daily for blisters, cuts, and redness. If you cannot see the bottom of your feet, use a mirror or ask someone for help.  Wash your feet with warm water (do not use hot water) and mild soap. Then pat your feet and the areas between your toes until they are completely dry. Do not soak your feet as this can dry your skin.  Apply a moisturizing lotion or petroleum jelly (that does not contain alcohol and is unscented) to the skin on your feet and to dry, brittle toenails. Do not apply lotion between your toes.  Trim your toenails straight across. Do not dig under them or around the cuticle. File the edges of your nails with an emery board or nail file.  Do not cut corns or calluses or try to remove them with medicine.  Wear clean socks or stockings every day. Make sure they are not too tight. Do not wear knee-high stockings since they may decrease blood flow to your legs.  Wear shoes that fit properly and have enough cushioning. To break in new shoes, wear them for just a few hours a day. This prevents you from injuring your feet. Always look in your shoes before you put them on to be sure there are no objects inside.  Do not cross your legs. This may decrease the blood flow to your feet.  If you find a minor scrape,  cut, or break in the skin on your feet, keep it and the skin around it clean and dry. These areas may be cleansed with mild soap and water. Do not cleanse the area with peroxide, alcohol, or iodine.  When you remove an adhesive bandage, be sure not to damage the skin around it.  If you have a wound, look at it several times a day to make sure it is healing.  Do not use heating pads or hot water bottles. They may burn your skin. If you have lost feeling in your feet or legs, you may not know it is happening until it is too late.  Make sure your health care provider performs a complete foot exam at least annually or more often if you have foot problems. Report any cuts, sores, or bruises to your health care provider immediately. SEEK MEDICAL CARE IF:   You have an injury that is not healing.  You have cuts or breaks in the skin.  You have an ingrown nail.  You notice redness on your legs or feet.  You feel burning or tingling in your legs or feet.  You have pain or cramps in your legs and feet.  Your legs or feet are numb.  Your feet always feel cold. SEEK IMMEDIATE MEDICAL CARE IF:   There is increasing redness,   swelling, or pain in or around a wound.  There is a red line that goes up your leg.  Pus is coming from a wound.  You develop a fever or as directed by your health care provider.  You notice a bad smell coming from an ulcer or wound. Document Released: 04/02/2000 Document Revised: 12/06/2012 Document Reviewed: 09/12/2012 [facility] Patient Information 2014 [place].        Dr Paula Libra Recommendations  For nutrition information, I recommend books:  1).Eat to Live by Dr Excell Seltzer. 2).Prevent and Reverse Heart Disease by Dr Karl Luke. 3) Dr Janene Harvey Book:  Program to Reverse Diabetes  Exercise recommendations are:  If unable to walk, then the patient can exercise in a chair 3 times a day. By flapping arms like a bird gently and  raising legs outwards to the front.  If ambulatory, the patient can go for walks for 30 minutes 3 times a week. Then increase the intensity and duration as tolerated.  Goal is to try to attain exercise frequency to 5 times a week.  If applicable: Best to perform resistance exercises (machines or weights) 2 days a week and cardio type exercises 3 days per week.

## 2013-05-09 LAB — NMR, LIPOPROFILE
Cholesterol: 298 mg/dL — ABNORMAL HIGH (ref <200)
HDL Cholesterol by NMR: 52 mg/dL (ref >=40)
HDL Particle Number: 28.4 umol/L — ABNORMAL LOW (ref >=30.5)
LDL Particle Number: 2614 nmol/L — ABNORMAL HIGH (ref <1000)
LDL Size: 21.1 nm (ref >20.5)
LDLC SERPL CALC-MCNC: 200 mg/dL — ABNORMAL HIGH (ref <100)
LP-IR Score: 62 — ABNORMAL HIGH (ref <=45)
Small LDL Particle Number: 1043 nmol/L — ABNORMAL HIGH (ref <=527)
Triglycerides by NMR: 232 mg/dL — ABNORMAL HIGH (ref <150)

## 2013-05-09 LAB — CMP14+EGFR
ALT: 20 IU/L (ref 0–44)
AST: 21 IU/L (ref 0–40)
Albumin/Globulin Ratio: 2.1 (ref 1.1–2.5)
Albumin: 4.6 g/dL (ref 3.6–4.8)
Alkaline Phosphatase: 104 IU/L (ref 39–117)
BUN/Creatinine Ratio: 13 (ref 10–22)
BUN: 12 mg/dL (ref 8–27)
CO2: 22 mmol/L (ref 18–29)
Calcium: 11.1 mg/dL — ABNORMAL HIGH (ref 8.6–10.2)
Chloride: 100 mmol/L (ref 97–108)
Creatinine, Ser: 0.91 mg/dL (ref 0.76–1.27)
GFR calc Af Amer: 105 mL/min/{1.73_m2} (ref >59)
GFR calc non Af Amer: 91 mL/min/{1.73_m2} (ref >59)
Globulin, Total: 2.2 g/dL (ref 1.5–4.5)
Glucose: 120 mg/dL — ABNORMAL HIGH (ref 65–99)
Potassium: 4.5 mmol/L (ref 3.5–5.2)
Sodium: 141 mmol/L (ref 134–144)
Total Bilirubin: 0.3 mg/dL (ref 0.0–1.2)
Total Protein: 6.8 g/dL (ref 6.0–8.5)

## 2013-05-13 ENCOUNTER — Other Ambulatory Visit: Payer: Self-pay | Admitting: Family Medicine

## 2013-05-13 DIAGNOSIS — E785 Hyperlipidemia, unspecified: Secondary | ICD-10-CM

## 2013-05-13 MED ORDER — ATORVASTATIN CALCIUM 20 MG PO TABS: 20.0000 mg | ORAL_TABLET | Freq: Every day | ORAL | Status: DC

## 2013-05-13 NOTE — Progress Notes (Signed)
Quick Note:  Call Patient Labs that are abnormal: Calcium too high Lipids are too high  Recommendations: Needs to start on a statin to reduce his cardiac risk. He needs to come in to do additional labwork. Ionized calcium and PTH  ______

## 2013-06-19 ENCOUNTER — Ambulatory Visit (INDEPENDENT_AMBULATORY_CARE_PROVIDER_SITE_OTHER): Payer: BC Managed Care – PPO | Admitting: Family Medicine

## 2013-06-19 ENCOUNTER — Encounter: Payer: Self-pay | Admitting: Family Medicine

## 2013-06-19 VITALS — BP 142/80 | HR 105 | Temp 98.9°F

## 2013-06-19 DIAGNOSIS — I10 Essential (primary) hypertension: Secondary | ICD-10-CM

## 2013-06-19 DIAGNOSIS — IMO0001 Reserved for inherently not codable concepts without codable children: Secondary | ICD-10-CM

## 2013-06-19 DIAGNOSIS — E785 Hyperlipidemia, unspecified: Secondary | ICD-10-CM

## 2013-06-19 DIAGNOSIS — E1165 Type 2 diabetes mellitus with hyperglycemia: Secondary | ICD-10-CM

## 2013-06-19 MED ORDER — LISINOPRIL 20 MG PO TABS: 20.0000 mg | ORAL_TABLET | Freq: Every day | ORAL | Status: DC

## 2013-06-19 NOTE — Progress Notes (Signed)
Patient ID: Philip [name], male   DOB: [birth year], 63 y.o.   MRN: [id number] SUBJECTIVE: CC: Chief Complaint  Patient presents with  . Hypertension    pt needs repeat labs and didnt start atorvastatin    HPI: Patient is here for follow up of hyperlipidemia/HTN: denies Headache;denies Chest Pain;denies weakness;denies Shortness of Breath and orthopnea;denies Visual changes;denies palpitations;denies cough;denies pedal edema;denies symptoms of TIA or stroke;deniesClaudication symptoms.  admits to Compliance with medications; with the winter storms and office closing , he did not get the message at a convenient time and did not get a chance to pick up the  Rx for atorvastatin. He will get it today and start tonight.  denies Problems with medications. Feels fine with present regimen.  He is increasingly moving towards a plant based  Diet. Past Medical History  Diagnosis Date  . Chest pain   . Diabetes mellitus without complication   . Hypertension    Past Surgical History  Procedure Laterality Date  . Back surgery  1999    hernitated and ruptured discs   History   Social History  . Marital Status: Single    Spouse Name: N/A    Number of Children: N/A  . Years of Education: N/A   Occupational History  . Not on file.   Social History Main Topics  . Smoking status: Former Research scientist (life sciences)  . Smokeless tobacco: Not on file  . Alcohol Use: No  . Drug Use: No  . Sexual Activity: Not on file   Other Topics Concern  . Not on file   Social History Narrative  . No narrative on file   History reviewed. No pertinent family history. Current Outpatient Prescriptions on File Prior to Visit  Medication Sig Dispense Refill  . amLODipine (NORVASC) 10 MG tablet Take 1 tablet (10 mg total) by mouth daily.  90 tablet  3  . atorvastatin (LIPITOR) 20 MG tablet Take 1 tablet (20 mg total) by mouth daily.  90 tablet  3  . Canagliflozin 100 MG TABS Take 1 tablet (100 mg total) by mouth every morning.   90 tablet  3  . metFORMIN (GLUCOPHAGE) 1000 MG tablet Take 1 tablet (1,000 mg total) by mouth 2 (two) times daily with a meal.  180 tablet  3  . nitroGLYCERIN (NITROSTAT) 0.4 MG SL tablet Place 0.4 mg under the tongue every 5 (five) minutes as needed for chest pain.       Marland Kitchen sertraline (ZOLOFT) 100 MG tablet Take 1 tablet (100 mg total) by mouth daily.  90 tablet  3   No current facility-administered medications on file prior to visit.   Allergies  Allergen Reactions  . Ativan [Lorazepam] Other (See Comments)    Caused patient not to remember several days.  . Fish Oil   . Shellfish Allergy   . Ultram [Tramadol Hcl] Hives    There is no immunization history on file for this patient. Prior to Admission medications   Medication Sig Start Date End Date Taking? Authorizing Provider  amLODipine (NORVASC) 10 MG tablet Take 1 tablet (10 mg total) by mouth daily. 05/08/13  Yes Vernie Shanks, MD  atorvastatin (LIPITOR) 20 MG tablet Take 1 tablet (20 mg total) by mouth daily. 05/13/13  Yes Vernie Shanks, MD  Canagliflozin 100 MG TABS Take 1 tablet (100 mg total) by mouth every morning. 05/08/13  Yes Vernie Shanks, MD  lisinopril (PRINIVIL,ZESTRIL) 20 MG tablet Take 1 tablet (20 mg total) by mouth daily. 06/19/13  Yes Vernie Shanks, MD  metFORMIN (GLUCOPHAGE) 1000 MG tablet Take 1 tablet (1,000 mg total) by mouth 2 (two) times daily with a meal. 05/08/13  Yes Vernie Shanks, MD  nitroGLYCERIN (NITROSTAT) 0.4 MG SL tablet Place 0.4 mg under the tongue every 5 (five) minutes as needed for chest pain.    Yes Historical Provider, MD  sertraline (ZOLOFT) 100 MG tablet Take 1 tablet (100 mg total) by mouth daily. 05/08/13  Yes Vernie Shanks, MD     ROS: As above in the HPI. All other systems are stable or negative.  OBJECTIVE: APPEARANCE:  Patient in no acute distress.The patient appeared well nourished and normally developed. Acyanotic. Waist: VITAL SIGNS:BP 142/80  Pulse 105  Temp(Src) 98.9 F  (37.2 C) (Oral)  WM  SKIN: warm and  Dry without overt rashes, tattoos and scars  HEAD and Neck: without JVD, Head and scalp: normal Eyes:No scleral icterus. Fundi normal, eye movements normal. Ears: Auricle normal, canal normal, Tympanic membranes normal, insufflation normal. Nose: normal Throat: normal Neck & thyroid: normal  CHEST & LUNGS: Chest wall: normal Lungs: Clear  CVS: Reveals the PMI to be normally located. Regular rhythm, First and Second Heart sounds are normal,  absence of murmurs, rubs or gallops. Peripheral vasculature: Radial pulses: normal Dorsal pedis pulses: normal Posterior pulses: normal  ABDOMEN:  Appearance: normal Benign, no organomegaly, no masses, no Abdominal Aortic enlargement. No Guarding , no rebound. No Bruits. Bowel sounds: normal  RECTAL: N/A GU: N/A  EXTREMETIES: nonedematous.  MUSCULOSKELETAL:  Spine: normal Joints: intact  NEUROLOGIC: oriented to time,place and person; nonfocal. Strength is normal Sensory is normal Reflexes are normal Cranial Nerves are normal.  ASSESSMENT:  Hypertension - Plan: lisinopril (PRINIVIL,ZESTRIL) 20 MG tablet  Hypercalcemia - Plan: PTH, Intact and Calcium, Calcium, ionized  Dyslipidemia  Type II or unspecified type diabetes mellitus without mention of complication, uncontrolled  PLAN: Plant based  Diet. Exercise and weight reduction. Will increase the lisinopril to 20 mg daily and  Recheck. Orders Placed This Encounter  Procedures  . PTH, Intact and Calcium  . Calcium, ionized   Meds ordered this encounter  Medications  . lisinopril (PRINIVIL,ZESTRIL) 20 MG tablet    Sig: Take 1 tablet (20 mg total) by mouth daily.    Dispense:  90 tablet    Refill:  3   Medications Discontinued During This Encounter  Medication Reason  . aspirin 325 MG tablet Discontinued by provider  . lisinopril (PRINIVIL,ZESTRIL) 10 MG tablet Reorder   Return in about 4 weeks (around 07/17/2013) for  recheck BP.  Vendetta Pittinger P. Jacelyn Grip, M.D.

## 2013-06-20 LAB — PTH, INTACT AND CALCIUM
Calcium: 10.9 mg/dL — ABNORMAL HIGH (ref 8.6–10.2)
PTH: 35 pg/mL (ref 15–65)

## 2013-06-20 LAB — CALCIUM, IONIZED: Calcium, Ion: 5.8 mg/dL — ABNORMAL HIGH (ref 4.5–5.6)

## 2013-06-20 NOTE — Progress Notes (Signed)
Quick Note:  Call Patient Labs that are abnormal: Calcium is a little high still. But the special test for parathyroid hormone was normal. Is he taking any calcium tablets or vitamins with calcium? If he is he needs to stop. Recommendations: Keep the follow up in 4 weeks and will recheck at that time.   ______

## 2013-07-23 ENCOUNTER — Ambulatory Visit (INDEPENDENT_AMBULATORY_CARE_PROVIDER_SITE_OTHER): Payer: BC Managed Care – PPO

## 2013-07-23 ENCOUNTER — Ambulatory Visit (INDEPENDENT_AMBULATORY_CARE_PROVIDER_SITE_OTHER): Payer: BC Managed Care – PPO | Admitting: Family Medicine

## 2013-07-23 ENCOUNTER — Encounter: Payer: Self-pay | Admitting: Family Medicine

## 2013-07-23 VITALS — BP 138/79 | HR 112 | Temp 97.9°F | Ht 69.0 in | Wt 217.4 lb

## 2013-07-23 DIAGNOSIS — M79609 Pain in unspecified limb: Secondary | ICD-10-CM

## 2013-07-23 DIAGNOSIS — M79642 Pain in left hand: Secondary | ICD-10-CM

## 2013-07-23 DIAGNOSIS — M949 Disorder of cartilage, unspecified: Secondary | ICD-10-CM

## 2013-07-23 DIAGNOSIS — IMO0001 Reserved for inherently not codable concepts without codable children: Secondary | ICD-10-CM

## 2013-07-23 DIAGNOSIS — M898X9 Other specified disorders of bone, unspecified site: Secondary | ICD-10-CM

## 2013-07-23 DIAGNOSIS — M899 Disorder of bone, unspecified: Secondary | ICD-10-CM

## 2013-07-23 DIAGNOSIS — I10 Essential (primary) hypertension: Secondary | ICD-10-CM

## 2013-07-23 DIAGNOSIS — E785 Hyperlipidemia, unspecified: Secondary | ICD-10-CM

## 2013-07-23 DIAGNOSIS — E1165 Type 2 diabetes mellitus with hyperglycemia: Secondary | ICD-10-CM

## 2013-07-23 DIAGNOSIS — F410 Panic disorder [episodic paroxysmal anxiety] without agoraphobia: Secondary | ICD-10-CM

## 2013-07-23 LAB — POCT CBC
Granulocyte percent: 66.6 %G (ref 37–80)
HCT, POC: 45.4 % (ref 43.5–53.7)
Hemoglobin: 14.6 g/dL (ref 14.1–18.1)
Lymph, poc: 2.8 (ref 0.6–3.4)
MCH, POC: 29.4 pg (ref 27–31.2)
MCHC: 32.3 g/dL (ref 31.8–35.4)
MCV: 91 fL (ref 80–97)
MPV: 6.1 fL (ref 0–99.8)
POC Granulocyte: 6.2 (ref 2–6.9)
POC LYMPH PERCENT: 30.3 %L (ref 10–50)
Platelet Count, POC: 293 10*3/uL (ref 142–424)
RBC: 5 M/uL (ref 4.69–6.13)
RDW, POC: 14.6 %
WBC: 9.3 10*3/uL (ref 4.6–10.2)

## 2013-07-23 LAB — POCT GLYCOSYLATED HEMOGLOBIN (HGB A1C): Hemoglobin A1C: 6.9

## 2013-07-23 MED ORDER — DICLOFENAC SODIUM 1 % TD GEL: 2.0000 g | Freq: Four times a day (QID) | TRANSDERMAL | Status: DC

## 2013-07-23 NOTE — Progress Notes (Signed)
Patient ID: Philip [name], male   DOB: [birth year], 63 y.o.   MRN: [id number] SUBJECTIVE: CC: Chief Complaint  Patient presents with  . Follow-up    1 month followup BP ? reck calcium level.pt states not taking any calcium a  . Arm Pain    left elbow pain radiating to fingers states gets :numb" and painfuil  since Thursday     HPI: Patient is here for follow up of hypertension: denies Headache;deniesChest Pain;denies weakness;denies Shortness of Breath or Orthopnea;denies Visual changes;denies palpitations;denies cough;denies pedal edema;denies symptoms of TIA or stroke; admits to Compliance with medications. denies Problems with medications.  No injury, new problem, pain back of th eleft hand and when he touches the area or pronates and  Supinates there was bad pain up to the lateral epicondyle.  Past Medical History  Diagnosis Date  . Chest pain   . Diabetes mellitus without complication   . Hypertension    Past Surgical History  Procedure Laterality Date  . Back surgery  1999    hernitated and ruptured discs   History   Social History  . Marital Status: Single    Spouse Name: N/A    Number of Children: N/A  . Years of Education: N/A   Occupational History  . Not on file.   Social History Main Topics  . Smoking status: Former Research scientist (life sciences)  . Smokeless tobacco: Not on file  . Alcohol Use: No  . Drug Use: No  . Sexual Activity: Not on file   Other Topics Concern  . Not on file   Social History Narrative  . No narrative on file   No family history on file. Current Outpatient Prescriptions on File Prior to Visit  Medication Sig Dispense Refill  . amLODipine (NORVASC) 10 MG tablet Take 1 tablet (10 mg total) by mouth daily.  90 tablet  3  . atorvastatin (LIPITOR) 20 MG tablet Take 1 tablet (20 mg total) by mouth daily.  90 tablet  3  . Canagliflozin 100 MG TABS Take 1 tablet (100 mg total) by mouth every morning.  90 tablet  3  . lisinopril (PRINIVIL,ZESTRIL) 20 MG  tablet Take 1 tablet (20 mg total) by mouth daily.  90 tablet  3  . metFORMIN (GLUCOPHAGE) 1000 MG tablet Take 1 tablet (1,000 mg total) by mouth 2 (two) times daily with a meal.  180 tablet  3  . nitroGLYCERIN (NITROSTAT) 0.4 MG SL tablet Place 0.4 mg under the tongue every 5 (five) minutes as needed for chest pain.       Marland Kitchen sertraline (ZOLOFT) 100 MG tablet Take 1 tablet (100 mg total) by mouth daily.  90 tablet  3   No current facility-administered medications on file prior to visit.   Allergies  Allergen Reactions  . Ativan [Lorazepam] Other (See Comments)    Caused patient not to remember several days.  . Fish Oil   . Shellfish Allergy   . Ultram [Tramadol Hcl] Hives    There is no immunization history on file for this patient. Prior to Admission medications   Medication Sig Start Date End Date Taking? Authorizing Provider  amLODipine (NORVASC) 10 MG tablet Take 1 tablet (10 mg total) by mouth daily. 05/08/13  Yes Vernie Shanks, MD  atorvastatin (LIPITOR) 20 MG tablet Take 1 tablet (20 mg total) by mouth daily. 05/13/13  Yes Vernie Shanks, MD  Canagliflozin 100 MG TABS Take 1 tablet (100 mg total) by mouth every morning. 05/08/13  Yes  Vernie Shanks, MD  lisinopril (PRINIVIL,ZESTRIL) 20 MG tablet Take 1 tablet (20 mg total) by mouth daily. 06/19/13  Yes Vernie Shanks, MD  metFORMIN (GLUCOPHAGE) 1000 MG tablet Take 1 tablet (1,000 mg total) by mouth 2 (two) times daily with a meal. 05/08/13  Yes Vernie Shanks, MD  nitroGLYCERIN (NITROSTAT) 0.4 MG SL tablet Place 0.4 mg under the tongue every 5 (five) minutes as needed for chest pain.    Yes Historical Provider, MD  sertraline (ZOLOFT) 100 MG tablet Take 1 tablet (100 mg total) by mouth daily. 05/08/13  Yes Vernie Shanks, MD     ROS: As above in the HPI. All other systems are stable or negative.  OBJECTIVE: APPEARANCE:  Patient in no acute distress.The patient appeared well nourished and normally developed.  Acyanotic. Waist: VITAL SIGNS:BP 138/79  Pulse 112  Temp(Src) 97.9 F (36.6 C) (Oral)  Ht 5' 9"  (1.753 m)  Wt 217 lb 6.4 oz (98.612 kg)  BMI 32.09 kg/m2  WM Obese  SKIN: warm and  Dry without overt rashes, tattoos and scars  HEAD and Neck: without JVD, Head and scalp: normal Eyes:No scleral icterus. Fundi normal, eye movements normal. Ears: Auricle normal, canal normal, Tympanic membranes normal, insufflation normal. Nose: normal Throat: normal Neck & thyroid: normal  CHEST & LUNGS: Chest wall: normal Lungs: Clear  CVS: Reveals the PMI to be normally located. Regular rhythm, First and Second Heart sounds are normal,  absence of murmurs, rubs or gallops. Peripheral vasculature: Radial pulses: normal Dorsal pedis pulses: normal Posterior pulses: normal  ABDOMEN:  Appearance: Obese Benign, no organomegaly, no masses, no Abdominal Aortic enlargement. No Guarding , no rebound. No Bruits. Bowel sounds: normal  RECTAL: N/A GU: N/A  EXTREMETIES: nonedematous.  MUSCULOSKELETAL:  Spine: normal Joints: tender left 4 th metacarpal bone on palpation of the daorsal aspect.   NEUROLOGIC: oriented to time,place and person; nonfocal. Strength is normal Sensory is normal Reflexes are normal Cranial Nerves are normal.  Results for orders placed in visit on 07/23/13  POCT CBC      Result Value Ref Range   WBC 9.3  4.6 - 10.2 K/uL   Lymph, poc 2.8  0.6 - 3.4   POC LYMPH PERCENT 30.3  10 - 50 %L   POC Granulocyte 6.2  2 - 6.9   Granulocyte percent 66.6  37 - 80 %G   RBC 5.0  4.69 - 6.13 M/uL   Hemoglobin 14.6  14.1 - 18.1 g/dL   HCT, POC 45.4  43.5 - 53.7 %   MCV 91.0  80 - 97 fL   MCH, POC 29.4  27 - 31.2 pg   MCHC 32.3  31.8 - 35.4 g/dL   RDW, POC 14.6     Platelet Count, POC 293.0  142 - 424 K/uL   MPV 6.1  0 - 99.8 fL  POCT GLYCOSYLATED HEMOGLOBIN (HGB A1C)      Result Value Ref Range   Hemoglobin A1C 6.9%      ASSESSMENT:  Left hand pain - Plan: DG  Hand Complete Left, Uric acid, diclofenac sodium (VOLTAREN) 1 % GEL  Bone pain - Plan: DG Hand Complete Left, POCT CBC, Sedimentation rate, CMP14+EGFR, Vit D  25 hydroxy (rtn osteoporosis monitoring), PSA, total and free, Uric acid  Hypercalcemia - Plan: CMP14+EGFR  Type II or unspecified type diabetes mellitus without mention of complication, uncontrolled - Plan: POCT glycosylated hemoglobin (Hb A1C)  Hypertension  Dyslipidemia - Plan: Lipid panel  Panic disorder  PLAN:  Orders Placed This Encounter  Procedures  . DG Hand Complete Left    Standing Status: Future     Number of Occurrences: 1     Standing Expiration Date: 09/23/2014    Order Specific Question:  Reason for Exam (SYMPTOM  OR DIAGNOSIS REQUIRED)    Answer:  left hanbd pain radiates back up forearm    Order Specific Question:  Preferred imaging location?    Answer:  Internal  . Sedimentation rate  . CMP14+EGFR  . Vit D  25 hydroxy (rtn osteoporosis monitoring)  . PSA, total and free  . Uric acid  . Lipid panel    Standing Status: Future     Number of Occurrences:      Standing Expiration Date: 07/24/2014    Order Specific Question:  Has the patient fasted?    Answer:  Yes  . POCT CBC  . POCT glycosylated hemoglobin (Hb A1C)  [facility] reading (PRIMARY) by  Dr. Jacelyn Grip: no abnormality to explain pain at the base of the 4 th metacarpal.                                Meds ordered this encounter  Medications  . diclofenac sodium (VOLTAREN) 1 % GEL    Sig: Apply 2 g topically 4 (four) times daily.    Dispense:  100 g    Refill:  1   There are no discontinued medications. Return in about 4 weeks (around 08/20/2013) for recheck hand.  Sherisa Gilvin P. Jacelyn Grip, M.D.

## 2013-07-24 ENCOUNTER — Other Ambulatory Visit (INDEPENDENT_AMBULATORY_CARE_PROVIDER_SITE_OTHER): Payer: BC Managed Care – PPO

## 2013-07-24 DIAGNOSIS — E785 Hyperlipidemia, unspecified: Secondary | ICD-10-CM

## 2013-07-24 LAB — CMP14+EGFR
ALT: 20 IU/L (ref 0–44)
AST: 20 IU/L (ref 0–40)
Albumin/Globulin Ratio: 2.5 (ref 1.1–2.5)
Albumin: 4.8 g/dL (ref 3.6–4.8)
Alkaline Phosphatase: 116 IU/L (ref 39–117)
BUN/Creatinine Ratio: 12 (ref 10–22)
BUN: 12 mg/dL (ref 8–27)
CO2: 23 mmol/L (ref 18–29)
Calcium: 11.3 mg/dL — ABNORMAL HIGH (ref 8.6–10.2)
Chloride: 102 mmol/L (ref 97–108)
Creatinine, Ser: 0.97 mg/dL (ref 0.76–1.27)
GFR calc Af Amer: 96 mL/min/{1.73_m2} (ref >59)
GFR calc non Af Amer: 83 mL/min/{1.73_m2} (ref >59)
Globulin, Total: 1.9 g/dL (ref 1.5–4.5)
Glucose: 181 mg/dL — ABNORMAL HIGH (ref 65–99)
Potassium: 4.8 mmol/L (ref 3.5–5.2)
Sodium: 144 mmol/L (ref 134–144)
Total Bilirubin: 0.2 mg/dL (ref 0.0–1.2)
Total Protein: 6.7 g/dL (ref 6.0–8.5)

## 2013-07-24 LAB — PSA, TOTAL AND FREE
PSA, Free Pct: 17.5 %
PSA, Free: 0.21 ng/mL
PSA: 1.2 ng/mL (ref 0.0–4.0)

## 2013-07-24 LAB — URIC ACID: Uric Acid: 3.6 mg/dL — ABNORMAL LOW (ref 3.7–8.6)

## 2013-07-24 LAB — VITAMIN D 25 HYDROXY (VIT D DEFICIENCY, FRACTURES): Vit D, 25-Hydroxy: 17.4 ng/mL — ABNORMAL LOW (ref 30.0–100.0)

## 2013-07-24 LAB — SEDIMENTATION RATE: Sed Rate: 3 mm/hr (ref 0–30)

## 2013-07-24 NOTE — Progress Notes (Signed)
Labs only

## 2013-07-25 LAB — LIPID PANEL
Chol/HDL Ratio: 6.4 ratio units — ABNORMAL HIGH (ref 0.0–5.0)
Cholesterol, Total: 263 mg/dL — ABNORMAL HIGH (ref 100–199)
HDL: 41 mg/dL (ref >39)
LDL Calculated: 158 mg/dL — ABNORMAL HIGH (ref 0–99)
Triglycerides: 322 mg/dL — ABNORMAL HIGH (ref 0–149)
VLDL Cholesterol Cal: 64 mg/dL — ABNORMAL HIGH (ref 5–40)

## 2013-07-25 LAB — PTH, INTACT AND CALCIUM
Calcium: 10.7 mg/dL — ABNORMAL HIGH (ref 8.6–10.2)
PTH: 30 pg/mL (ref 15–65)

## 2013-07-25 LAB — CALCIUM, IONIZED: Calcium, Ion: 5.8 mg/dL — ABNORMAL HIGH (ref 4.5–5.6)

## 2013-07-29 ENCOUNTER — Other Ambulatory Visit: Payer: Self-pay | Admitting: Family Medicine

## 2013-07-29 NOTE — Progress Notes (Signed)
Quick Note:  Call Patient Labs that are abnormal:  Lipids high and calcium a little high.  Recommendations: Needs to see Tammy to review diet and medications . Avoid calcium tablets and recheck calcium in 1 month. Ordered in EPIC   ______

## 2013-07-29 NOTE — Progress Notes (Signed)
Quick Note:  Call Patient Labs that are abnormal: Calcium very high Vitamin D is low.  The rest are at goal  Recommendations: Would like to refer him to an endocrinologist for work up and treatment of his hypercalcemia.ordered in EPIC   ______

## 2013-07-30 ENCOUNTER — Telehealth: Payer: Self-pay | Admitting: Family Medicine

## 2013-07-30 NOTE — Telephone Encounter (Signed)
Message copied by Cline Crock on Mon Jul 30, 2013  8:49 AM ------      Message from: Vernie Shanks      Created: Sun Jul 29, 2013  9:18 PM       Call Patient      Labs that are abnormal:            Lipids high and calcium a little high.            Recommendations:      Needs to see Tammy to review diet and medications .      Avoid calcium tablets and recheck calcium in 1 month. Ordered in EPIC             ------

## 2013-08-03 NOTE — Telephone Encounter (Signed)
Patient aware.

## 2013-08-06 ENCOUNTER — Encounter: Payer: Self-pay | Admitting: *Deleted

## 2013-08-20 ENCOUNTER — Ambulatory Visit (INDEPENDENT_AMBULATORY_CARE_PROVIDER_SITE_OTHER): Payer: BC Managed Care – PPO | Admitting: Family Medicine

## 2013-08-20 ENCOUNTER — Encounter: Payer: Self-pay | Admitting: Family Medicine

## 2013-08-20 VITALS — BP 129/77 | HR 90 | Temp 98.6°F | Ht 69.0 in | Wt 217.2 lb

## 2013-08-20 DIAGNOSIS — I10 Essential (primary) hypertension: Secondary | ICD-10-CM

## 2013-08-20 DIAGNOSIS — IMO0001 Reserved for inherently not codable concepts without codable children: Secondary | ICD-10-CM

## 2013-08-20 DIAGNOSIS — E559 Vitamin D deficiency, unspecified: Secondary | ICD-10-CM | POA: Insufficient documentation

## 2013-08-20 DIAGNOSIS — E1165 Type 2 diabetes mellitus with hyperglycemia: Principal | ICD-10-CM

## 2013-08-20 DIAGNOSIS — E785 Hyperlipidemia, unspecified: Secondary | ICD-10-CM

## 2013-08-20 MED ORDER — ATORVASTATIN CALCIUM 20 MG PO TABS: 20.0000 mg | ORAL_TABLET | Freq: Every day | ORAL | Status: DC

## 2013-08-20 NOTE — Patient Instructions (Signed)
Diabetes and Foot Care Diabetes may cause you to have problems because of poor blood supply (circulation) to your feet and legs. This may cause the skin on your feet to become thinner, break easier, and heal more slowly. Your skin may become dry, and the skin may peel and crack. You may also have nerve damage in your legs and feet causing decreased feeling in them. You may not notice minor injuries to your feet that could lead to infections or more serious problems. Taking care of your feet is one of the most important things you can do for yourself.  HOME CARE INSTRUCTIONS  Wear shoes at all times, even in the house. Do not go barefoot. Bare feet are easily injured.  Check your feet daily for blisters, cuts, and redness. If you cannot see the bottom of your feet, use a mirror or ask someone for help.  Wash your feet with warm water (do not use hot water) and mild soap. Then pat your feet and the areas between your toes until they are completely dry. Do not soak your feet as this can dry your skin.  Apply a moisturizing lotion or petroleum jelly (that does not contain alcohol and is unscented) to the skin on your feet and to dry, brittle toenails. Do not apply lotion between your toes.  Trim your toenails straight across. Do not dig under them or around the cuticle. File the edges of your nails with an emery board or nail file.  Do not cut corns or calluses or try to remove them with medicine.  Wear clean socks or stockings every day. Make sure they are not too tight. Do not wear knee-high stockings since they may decrease blood flow to your legs.  Wear shoes that fit properly and have enough cushioning. To break in new shoes, wear them for just a few hours a day. This prevents you from injuring your feet. Always look in your shoes before you put them on to be sure there are no objects inside.  Do not cross your legs. This may decrease the blood flow to your feet.  If you find a minor scrape,  cut, or break in the skin on your feet, keep it and the skin around it clean and dry. These areas may be cleansed with mild soap and water. Do not cleanse the area with peroxide, alcohol, or iodine.  When you remove an adhesive bandage, be sure not to damage the skin around it.  If you have a wound, look at it several times a day to make sure it is healing.  Do not use heating pads or hot water bottles. They may burn your skin. If you have lost feeling in your feet or legs, you may not know it is happening until it is too late.  Make sure your health care provider performs a complete foot exam at least annually or more often if you have foot problems. Report any cuts, sores, or bruises to your health care provider immediately. SEEK MEDICAL CARE IF:   You have an injury that is not healing.  You have cuts or breaks in the skin.  You have an ingrown nail.  You notice redness on your legs or feet.  You feel burning or tingling in your legs or feet.  You have pain or cramps in your legs and feet.  Your legs or feet are numb.  Your feet always feel cold. SEEK IMMEDIATE MEDICAL CARE IF:   There is increasing redness,   swelling, or pain in or around a wound.  There is a red line that goes up your leg.  Pus is coming from a wound.  You develop a fever or as directed by your health care provider.  You notice a bad smell coming from an ulcer or wound. Document Released: 04/02/2000 Document Revised: 12/06/2012 Document Reviewed: 09/12/2012 Exit[employer] Patient Information 2014 [employer].  

## 2013-08-20 NOTE — Progress Notes (Signed)
Patient ID: Philip [name], male   DOB: [birth year], 63 y.o.   MRN: [id number] SUBJECTIVE: CC: Chief Complaint  Patient presents with  . Hand Pain    recheck    HPI: Left hand is fine now. May have slept on it wrong. Feeling good. Has appointment with Dr Chalmers Cater in regards to elevated Calcium.  Patient is here for follow up of Diabetes Mellitus: Symptoms evaluated: Denies Nocturia ,Denies Urinary Frequency , denies Blurred vision ,deniesDizziness,denies.Dysuria,denies paresthesias, denies extremity pain or ulcers.Marland Kitchendenies chest pain. has had an annual eye exam. do check the feet. Does check CBGs. Average CBG:100 or less Denies episodes of hypoglycemia. Does have an emergency hypoglycemic plan. admits toCompliance with medications. Denies Problems with medications.  Did not get the Rx at [facility] for the lipitor to start it. The Rx was not there and needs it reordered.   Past Medical History  Diagnosis Date  . Chest pain   . Diabetes mellitus without complication   . Hypertension    Past Surgical History  Procedure Laterality Date  . Back surgery  1999    hernitated and ruptured discs  . Tonsillectomy     History   Social History  . Marital Status: Single    Spouse Name: N/A    Number of Children: N/A  . Years of Education: N/A   Occupational History  . Not on file.   Social History Main Topics  . Smoking status: Former Research scientist (life sciences)  . Smokeless tobacco: Not on file  . Alcohol Use: No  . Drug Use: No  . Sexual Activity: Not on file   Other Topics Concern  . Not on file   Social History Narrative  . No narrative on file   No family history on file. Current Outpatient Prescriptions on File Prior to Visit  Medication Sig Dispense Refill  . amLODipine (NORVASC) 10 MG tablet Take 1 tablet (10 mg total) by mouth daily.  90 tablet  3  . Canagliflozin 100 MG TABS Take 1 tablet (100 mg total) by mouth every morning.  90 tablet  3  . lisinopril (PRINIVIL,ZESTRIL) 20 MG  tablet Take 1 tablet (20 mg total) by mouth daily.  90 tablet  3  . metFORMIN (GLUCOPHAGE) 1000 MG tablet Take 1 tablet (1,000 mg total) by mouth 2 (two) times daily with a meal.  180 tablet  3  . nitroGLYCERIN (NITROSTAT) 0.4 MG SL tablet Place 0.4 mg under the tongue every 5 (five) minutes as needed for chest pain.       Marland Kitchen sertraline (ZOLOFT) 100 MG tablet Take 1 tablet (100 mg total) by mouth daily.  90 tablet  3   No current facility-administered medications on file prior to visit.   Allergies  Allergen Reactions  . Ativan [Lorazepam] Other (See Comments)    Caused patient not to remember several days.  . Fish Oil   . Shellfish Allergy   . Ultram [Tramadol Hcl] Hives    There is no immunization history on file for this patient. Prior to Admission medications   Medication Sig Start Date End Date Taking? Authorizing Provider  amLODipine (NORVASC) 10 MG tablet Take 1 tablet (10 mg total) by mouth daily. 05/08/13  Yes Vernie Shanks, MD  Canagliflozin 100 MG TABS Take 1 tablet (100 mg total) by mouth every morning. 05/08/13  Yes Vernie Shanks, MD  lisinopril (PRINIVIL,ZESTRIL) 20 MG tablet Take 1 tablet (20 mg total) by mouth daily. 06/19/13  Yes Vernie Shanks, MD  metFORMIN (  GLUCOPHAGE) 1000 MG tablet Take 1 tablet (1,000 mg total) by mouth 2 (two) times daily with a meal. 05/08/13  Yes Vernie Shanks, MD  nitroGLYCERIN (NITROSTAT) 0.4 MG SL tablet Place 0.4 mg under the tongue every 5 (five) minutes as needed for chest pain.    Yes Historical Provider, MD  sertraline (ZOLOFT) 100 MG tablet Take 1 tablet (100 mg total) by mouth daily. 05/08/13  Yes Vernie Shanks, MD  atorvastatin (LIPITOR) 20 MG tablet Take 1 tablet (20 mg total) by mouth daily. 05/13/13   Vernie Shanks, MD     ROS: As above in the HPI. All other systems are stable or negative.  OBJECTIVE: APPEARANCE:  Patient in no acute distress.The patient appeared well nourished and normally developed. Acyanotic. Waist: VITAL  SIGNS:BP 129/77  Pulse 90  Temp(Src) 98.6 F (37 C) (Oral)  Ht _0  (1.753 m)  Wt 217 lb 3.2 oz (98.521 kg)  BMI 32.06 kg/m2 WM obese  SKIN: warm and  Dry without overt rashes, tattoos and scars  HEAD and Neck: without JVD, Head and scalp: normal Eyes:No scleral icterus. Fundi normal, eye movements normal. Ears: Auricle normal, canal normal, Tympanic membranes normal, insufflation normal. Nose: normal Throat: normal Neck & thyroid: normal  CHEST & LUNGS: Chest wall: normal Lungs: Clear  CVS: Reveals the PMI to be normally located. Regular rhythm, First and Second Heart sounds are normal,  absence of murmurs, rubs or gallops. Peripheral vasculature: Radial pulses: normal Dorsal pedis pulses: normal Posterior pulses: normal  ABDOMEN:  Appearance: Obese Benign, no organomegaly, no masses, no Abdominal Aortic enlargement. No Guarding , no rebound. No Bruits. Bowel sounds: normal  RECTAL: N/A GU: N/A  EXTREMETIES: nonedematous.  MUSCULOSKELETAL:  Spine: normal Joints: intact  NEUROLOGIC: oriented to time,place and person; nonfocal. Strength is normal Sensory is normal Reflexes are normal Cranial Nerves are normal.  ASSESSMENT:  Type II or unspecified type diabetes mellitus without mention of complication, uncontrolled  Hypertension  Dyslipidemia  Hypercalcemia - Plan: BMP8+EGFR, Calcium, ionized  HLD (hyperlipidemia) - Plan: atorvastatin (LIPITOR) 20 MG tablet  Unspecified vitamin D deficiency - Plan: Vit D  25 hydroxy (rtn osteoporosis monitoring) Resolved hand pain.  PLAN: DM footcare in the AVS.  Orders Placed This Encounter  Procedures  . BMP8+EGFR  . Calcium, ionized  . Vit D  25 hydroxy (rtn osteoporosis monitoring)   Meds ordered this encounter  Medications  . atorvastatin (LIPITOR) 20 MG tablet    Sig: Take 1 tablet (20 mg total) by mouth daily.    Dispense:  90 tablet    Refill:  3   Medications Discontinued During This  Encounter  Medication Reason  . diclofenac sodium (VOLTAREN) 1 % GEL Cost of medication  . atorvastatin (LIPITOR) 20 MG tablet Reorder   Return in about 3 months (around 11/20/2013) for Recheck medical problems.  Aralyn Nowak P. Jacelyn Grip, M.D.

## 2013-08-27 LAB — BMP8+EGFR
BUN/Creatinine Ratio: 16 (ref 10–22)
BUN: 16 mg/dL (ref 8–27)
CO2: 23 mmol/L (ref 18–29)
Calcium: 11 mg/dL — ABNORMAL HIGH (ref 8.6–10.2)
Chloride: 102 mmol/L (ref 97–108)
Creatinine, Ser: 0.97 mg/dL (ref 0.76–1.27)
GFR calc Af Amer: 96 mL/min/{1.73_m2} (ref >59)
GFR calc non Af Amer: 83 mL/min/{1.73_m2} (ref >59)
Glucose: 121 mg/dL — ABNORMAL HIGH (ref 65–99)
Potassium: 4.4 mmol/L (ref 3.5–5.2)
Sodium: 141 mmol/L (ref 134–144)

## 2013-08-27 LAB — CALCIUM, IONIZED: Calcium, Ion: 5.9 mg/dL — ABNORMAL HIGH (ref 4.5–5.6)

## 2013-08-27 LAB — VITAMIN D 25 HYDROXY (VIT D DEFICIENCY, FRACTURES): Vit D, 25-Hydroxy: 13.8 ng/mL — ABNORMAL LOW (ref 30.0–100.0)

## 2013-11-28 ENCOUNTER — Telehealth: Payer: Self-pay | Admitting: Family Medicine

## 2013-11-28 NOTE — Telephone Encounter (Signed)
I see no documentation that someone tried to contact him.  Relayed this to patient.

## 2014-01-03 ENCOUNTER — Ambulatory Visit: Payer: BC Managed Care – PPO | Admitting: Family Medicine

## 2014-01-17 ENCOUNTER — Encounter: Payer: Self-pay | Admitting: Family Medicine

## 2014-01-17 ENCOUNTER — Ambulatory Visit (INDEPENDENT_AMBULATORY_CARE_PROVIDER_SITE_OTHER): Payer: BC Managed Care – PPO | Admitting: Family Medicine

## 2014-01-17 VITALS — BP 139/84 | HR 104 | Temp 97.6°F | Ht 69.0 in | Wt 220.0 lb

## 2014-01-17 DIAGNOSIS — I10 Essential (primary) hypertension: Secondary | ICD-10-CM

## 2014-01-17 DIAGNOSIS — E119 Type 2 diabetes mellitus without complications: Secondary | ICD-10-CM

## 2014-01-17 DIAGNOSIS — E785 Hyperlipidemia, unspecified: Secondary | ICD-10-CM | POA: Insufficient documentation

## 2014-01-17 LAB — POCT GLYCOSYLATED HEMOGLOBIN (HGB A1C): Hemoglobin A1C: 6.8

## 2014-01-17 MED ORDER — EZETIMIBE 10 MG PO TABS: 10.0000 mg | ORAL_TABLET | Freq: Every day | ORAL | Status: DC

## 2014-01-17 MED ORDER — CITALOPRAM HYDROBROMIDE 20 MG PO TABS: 20.0000 mg | ORAL_TABLET | Freq: Every day | ORAL | Status: DC

## 2014-01-17 NOTE — Progress Notes (Signed)
Subjective:    Patient ID: Philip [name], male    DOB: 11/29/1950, 63 y.o.   MRN: [id number]  HPI  63-year-old gentleman here to follow up diabetes hypertension, and depression. He checks his blood sugars at home randomly and they seem to have been stable. He is intolerant of statins but in review of his last lipids would most certainly benefit from treatment. He also asks today about medicines other than Zoloft for his depression. Some coworkers have noted increased irritability and shorter temper.    Review of Systems  Constitutional: Negative.   HENT: Negative.   Eyes: Negative.   Respiratory: Negative.  Negative for shortness of breath.   Cardiovascular: Negative.  Negative for chest pain and leg swelling.  Gastrointestinal: Negative.   Genitourinary: Negative.   Musculoskeletal: Negative.   Skin: Negative.   Neurological: Negative.   Psychiatric/Behavioral: Negative.   All other systems reviewed and are negative.      Objective:   Physical Exam  Constitutional: He is oriented to person, place, and time. He appears well-developed and well-nourished.  HENT:  Head: Normocephalic.  Right Ear: External ear normal.  Left Ear: External ear normal.  Nose: Nose normal.  Mouth/Throat: Oropharynx is clear and moist.  Eyes: Conjunctivae and EOM are normal. Pupils are equal, round, and reactive to light.  Neck: Normal range of motion. Neck supple.  Cardiovascular: Normal rate, regular rhythm, normal heart sounds and intact distal pulses.   Pulmonary/Chest: Effort normal and breath sounds normal.  Abdominal: Soft. Bowel sounds are normal.  Musculoskeletal: Normal range of motion.  Neurological: He is alert and oriented to person, place, and time.  Skin: Skin is warm and dry.  Psychiatric: He has a normal mood and affect. His behavior is normal. Judgment and thought content normal.     BP 139/84  Pulse 104  Temp(Src) 97.6 F (36.4 C) (Oral)  Ht 5' 9" (1.753 m)  Wt 220 lb  (99.791 kg)  BMI 32.47 kg/m2     Assessment & Plan:  1. Essential hypertension  - POCT glycosylated hemoglobin (Hb A1C) - CMP14+EGFR  2. Type 2 diabetes mellitus without complication  - POCT glycosylated hemoglobin (Hb A1C)  3. Depression  Stephen M Miller MD Taper Zoloft and begin Celexa 

## 2014-01-17 NOTE — Progress Notes (Signed)
   Subjective:    Patient ID: Philip [name], male    DOB: 1950-12-01, 63 y.o.   MRN: [id number]  HPI    Patient Active Problem List   Diagnosis Date Noted  . Hyperlipidemia   . Unspecified vitamin D deficiency 08/20/2013  . HLD (hyperlipidemia) 08/20/2013  . Hypercalcemia 06/19/2013  . Shortness of breath 10/30/2012  . Diabetes 10/30/2012  . Non compliance with medical treatment 10/30/2012  . Panic disorder 10/30/2012  . Chest pain 09/24/2010  . Hypertension 09/24/2010  . Dyslipidemia 09/24/2010   Outpatient Encounter Prescriptions as of 01/17/2014  Medication Sig  . amLODipine (NORVASC) 10 MG tablet Take 1 tablet (10 mg total) by mouth daily.  . Canagliflozin 100 MG TABS Take 1 tablet (100 mg total) by mouth every morning.  Marland Kitchen lisinopril (PRINIVIL,ZESTRIL) 20 MG tablet Take 1 tablet (20 mg total) by mouth daily.  . metFORMIN (GLUCOPHAGE) 1000 MG tablet Take 1 tablet (1,000 mg total) by mouth 2 (two) times daily with a meal.  . nitroGLYCERIN (NITROSTAT) 0.4 MG SL tablet Place 0.4 mg under the tongue every 5 (five) minutes as needed for chest pain.   Marland Kitchen sertraline (ZOLOFT) 100 MG tablet Take 1 tablet (100 mg total) by mouth daily.  . [DISCONTINUED] atorvastatin (LIPITOR) 20 MG tablet Take 1 tablet (20 mg total) by mouth daily.    Review of Systems     Objective:   Physical Exam BP 139/84  Pulse 104  Temp(Src) 97.6 F (36.4 C) (Oral)  Ht 5\' 9"  (1.753 m)  Wt 220 lb (99.791 kg)  BMI 32.47 kg/m2         Assessment & Plan:

## 2014-01-18 ENCOUNTER — Telehealth: Payer: Self-pay | Admitting: Family Medicine

## 2014-01-18 LAB — CMP14+EGFR
ALT: 18 IU/L (ref 0–44)
AST: 17 IU/L (ref 0–40)
Albumin/Globulin Ratio: 2 (ref 1.1–2.5)
Albumin: 4.3 g/dL (ref 3.6–4.8)
Alkaline Phosphatase: 97 IU/L (ref 39–117)
BUN/Creatinine Ratio: 18 (ref 10–22)
BUN: 15 mg/dL (ref 8–27)
CO2: 23 mmol/L (ref 18–29)
Calcium: 10.7 mg/dL — ABNORMAL HIGH (ref 8.6–10.2)
Chloride: 101 mmol/L (ref 97–108)
Creatinine, Ser: 0.85 mg/dL (ref 0.76–1.27)
GFR calc Af Amer: 108 mL/min/{1.73_m2} (ref >59)
GFR calc non Af Amer: 93 mL/min/{1.73_m2} (ref >59)
Globulin, Total: 2.2 g/dL (ref 1.5–4.5)
Glucose: 153 mg/dL — ABNORMAL HIGH (ref 65–99)
Potassium: 5 mmol/L (ref 3.5–5.2)
Sodium: 139 mmol/L (ref 134–144)
Total Bilirubin: 0.3 mg/dL (ref 0.0–1.2)
Total Protein: 6.5 g/dL (ref 6.0–8.5)

## 2014-01-18 NOTE — Telephone Encounter (Signed)
Message copied by Waverly Ferrari on Fri Jan 18, 2014  9:58 AM ------      Message from: [name]      Created: Fri Jan 18, 2014  7:44 AM       Hemoglobin A1c is at goal. Serum calcium still remains slightly elevated but has improved and has been evaluated previously. I do not think further evaluation is indicated at this time. ------

## 2014-01-19 ENCOUNTER — Encounter: Payer: Self-pay | Admitting: *Deleted

## 2014-05-07 ENCOUNTER — Encounter: Payer: Self-pay | Admitting: *Deleted

## 2014-05-15 ENCOUNTER — Ambulatory Visit (INDEPENDENT_AMBULATORY_CARE_PROVIDER_SITE_OTHER): Payer: BLUE CROSS/BLUE SHIELD | Admitting: Family Medicine

## 2014-05-15 ENCOUNTER — Encounter: Payer: Self-pay | Admitting: Family Medicine

## 2014-05-15 ENCOUNTER — Ambulatory Visit (INDEPENDENT_AMBULATORY_CARE_PROVIDER_SITE_OTHER): Payer: BLUE CROSS/BLUE SHIELD

## 2014-05-15 VITALS — BP 129/81 | HR 115 | Temp 97.6°F | Ht 69.0 in | Wt 222.0 lb

## 2014-05-15 DIAGNOSIS — M5431 Sciatica, right side: Secondary | ICD-10-CM

## 2014-05-15 MED ORDER — PREDNISONE 10 MG PO TABS: ORAL_TABLET | ORAL | Status: DC

## 2014-05-15 MED ORDER — OXYCODONE-ACETAMINOPHEN 5-325 MG PO TABS: ORAL_TABLET | ORAL | Status: DC

## 2014-05-15 NOTE — Progress Notes (Signed)
   Subjective:    Patient ID: Philip [name], male    DOB: 03-18-1951, 64 y.o.   MRN: [id number]  HPI  Low back pain with radicular pain into right buttocks and right leg x 2 weeks. He has taken multiple OTC NSAIDs and prescription pain medications.  Pain actually begins in right hip is down the back of the leg ends up on top of the foot. He has a past history of disc surgery 2. He has not gotten any relief from above medicines and generally it is better to stand rather than sit or lay down   Patient Active Problem List   Diagnosis Date Noted  . Hyperlipidemia   . Unspecified vitamin D deficiency 08/20/2013  . HLD (hyperlipidemia) 08/20/2013  . Hypercalcemia 06/19/2013  . Shortness of breath 10/30/2012  . Diabetes 10/30/2012  . Non compliance with medical treatment 10/30/2012  . Panic disorder 10/30/2012  . Chest pain 09/24/2010  . Hypertension 09/24/2010  . Dyslipidemia 09/24/2010   Outpatient Encounter Prescriptions as of 05/15/2014  Medication Sig  . amLODipine (NORVASC) 10 MG tablet Take 1 tablet (10 mg total) by mouth daily.  . Canagliflozin 100 MG TABS Take 1 tablet (100 mg total) by mouth every morning.  . citalopram (CELEXA) 20 MG tablet Take 1 tablet (20 mg total) by mouth daily.  Marland Kitchen ezetimibe (ZETIA) 10 MG tablet Take 1 tablet (10 mg total) by mouth daily.  Marland Kitchen lisinopril (PRINIVIL,ZESTRIL) 20 MG tablet Take 1 tablet (20 mg total) by mouth daily.  . metFORMIN (GLUCOPHAGE) 1000 MG tablet Take 1 tablet (1,000 mg total) by mouth 2 (two) times daily with a meal.  . nitroGLYCERIN (NITROSTAT) 0.4 MG SL tablet Place 0.4 mg under the tongue every 5 (five) minutes as needed for chest pain.      Review of Systems  Musculoskeletal: Positive for gait problem.       Objective:   Physical Exam  Musculoskeletal:  Pain over the right sciatic notch. Straight leg raising is positive at 40. Reflexes are depressed at the right patellar compared to left but this may be the result of prior  surgery at L4-5. Strength is unaffected at this time   BP 129/81 mmHg  Pulse 115  Temp(Src) 97.6 F (36.4 C) (Oral)  Ht 5\' 9"  (1.753 m)  Wt 222 lb (100.699 kg)  BMI 32.77 kg/m2        Assessment & Plan:  1. Sciatica, right Pt in obvious discomfort.  Will treat with prednisone, 60,60,40,40,20,20 and Oxycodone 5-10 mg - DG Lumbar Spine 2-3 Views; Future  [name] MD

## 2014-05-22 ENCOUNTER — Telehealth: Payer: Self-pay | Admitting: Family Medicine

## 2014-05-22 DIAGNOSIS — E119 Type 2 diabetes mellitus without complications: Secondary | ICD-10-CM

## 2014-05-22 MED ORDER — METFORMIN HCL 1000 MG PO TABS: 1000.0000 mg | ORAL_TABLET | Freq: Two times a day (BID) | ORAL | Status: DC

## 2014-05-22 NOTE — Telephone Encounter (Signed)
Done,called pt.

## 2014-06-11 ENCOUNTER — Other Ambulatory Visit: Payer: Self-pay

## 2014-06-11 NOTE — Telephone Encounter (Signed)
Last seen 05/15/14 Dr Sabra Heck  This med not on EPIC list

## 2014-06-13 MED ORDER — CANAGLIFLOZIN 100 MG PO TABS: 100.0000 mg | ORAL_TABLET | Freq: Every day | ORAL | Status: DC

## 2014-06-13 MED ORDER — LISINOPRIL 20 MG PO TABS: 20.0000 mg | ORAL_TABLET | Freq: Every day | ORAL | Status: DC

## 2014-07-19 ENCOUNTER — Telehealth: Payer: Self-pay | Admitting: Family Medicine

## 2014-07-19 MED ORDER — PREDNISONE 10 MG PO TABS: ORAL_TABLET | ORAL | Status: DC

## 2014-07-19 NOTE — Telephone Encounter (Signed)
Patient seen in January for back pain and x-ray showed mild compression at T 10 and degenerative changes.  Will you refill his prednisone or will he need to be seen?   He prefers [facility] in [place] for refill.

## 2014-07-19 NOTE — Telephone Encounter (Signed)
Prednisone rx sent to pharmacy.

## 2014-07-19 NOTE — Telephone Encounter (Signed)
Informed pt refill was sent to pharmacy

## 2014-07-22 ENCOUNTER — Emergency Department (HOSPITAL_COMMUNITY): Payer: BLUE CROSS/BLUE SHIELD

## 2014-07-22 ENCOUNTER — Observation Stay (HOSPITAL_COMMUNITY)
Admission: EM | Admit: 2014-07-22 | Discharge: 2014-07-25 | Disposition: A | Discharge status: Home / Self Care | Payer: BLUE CROSS/BLUE SHIELD | Attending: Internal Medicine | Admitting: Internal Medicine

## 2014-07-22 ENCOUNTER — Encounter (HOSPITAL_COMMUNITY): Payer: Self-pay | Admitting: Emergency Medicine

## 2014-07-22 DIAGNOSIS — Z9119 Patient's noncompliance with other medical treatment and regimen: Secondary | ICD-10-CM

## 2014-07-22 DIAGNOSIS — D72829 Elevated white blood cell count, unspecified: Secondary | ICD-10-CM | POA: Diagnosis present

## 2014-07-22 DIAGNOSIS — Z79899 Other long term (current) drug therapy: Secondary | ICD-10-CM | POA: Diagnosis not present

## 2014-07-22 DIAGNOSIS — F1310 Sedative, hypnotic or anxiolytic abuse, uncomplicated: Secondary | ICD-10-CM | POA: Diagnosis not present

## 2014-07-22 DIAGNOSIS — R52 Pain, unspecified: Secondary | ICD-10-CM

## 2014-07-22 DIAGNOSIS — W1839XA Other fall on same level, initial encounter: Secondary | ICD-10-CM | POA: Insufficient documentation

## 2014-07-22 DIAGNOSIS — S79921A Unspecified injury of right thigh, initial encounter: Secondary | ICD-10-CM | POA: Insufficient documentation

## 2014-07-22 DIAGNOSIS — S79911A Unspecified injury of right hip, initial encounter: Secondary | ICD-10-CM | POA: Diagnosis present

## 2014-07-22 DIAGNOSIS — F4310 Post-traumatic stress disorder, unspecified: Secondary | ICD-10-CM | POA: Diagnosis not present

## 2014-07-22 DIAGNOSIS — E785 Hyperlipidemia, unspecified: Secondary | ICD-10-CM | POA: Insufficient documentation

## 2014-07-22 DIAGNOSIS — Z720 Tobacco use: Secondary | ICD-10-CM | POA: Insufficient documentation

## 2014-07-22 DIAGNOSIS — Y9389 Activity, other specified: Secondary | ICD-10-CM | POA: Insufficient documentation

## 2014-07-22 DIAGNOSIS — Y998 Other external cause status: Secondary | ICD-10-CM | POA: Diagnosis not present

## 2014-07-22 DIAGNOSIS — M79604 Pain in right leg: Secondary | ICD-10-CM

## 2014-07-22 DIAGNOSIS — I10 Essential (primary) hypertension: Secondary | ICD-10-CM | POA: Insufficient documentation

## 2014-07-22 DIAGNOSIS — E119 Type 2 diabetes mellitus without complications: Secondary | ICD-10-CM | POA: Insufficient documentation

## 2014-07-22 DIAGNOSIS — I152 Hypertension secondary to endocrine disorders: Secondary | ICD-10-CM | POA: Diagnosis present

## 2014-07-22 DIAGNOSIS — F410 Panic disorder [episodic paroxysmal anxiety] without agoraphobia: Secondary | ICD-10-CM | POA: Diagnosis present

## 2014-07-22 DIAGNOSIS — Y9289 Other specified places as the place of occurrence of the external cause: Secondary | ICD-10-CM | POA: Diagnosis not present

## 2014-07-22 DIAGNOSIS — M5430 Sciatica, unspecified side: Secondary | ICD-10-CM | POA: Diagnosis present

## 2014-07-22 DIAGNOSIS — R404 Transient alteration of awareness: Secondary | ICD-10-CM | POA: Diagnosis present

## 2014-07-22 DIAGNOSIS — Z91199 Patient's noncompliance with other medical treatment and regimen due to unspecified reason: Secondary | ICD-10-CM

## 2014-07-22 DIAGNOSIS — W19XXXA Unspecified fall, initial encounter: Secondary | ICD-10-CM

## 2014-07-22 HISTORY — DX: Post-traumatic stress disorder, unspecified: F43.10

## 2014-07-22 LAB — CBC WITH DIFFERENTIAL/PLATELET
Basophils Absolute: 0 10*3/uL (ref 0.0–0.1)
Basophils Relative: 0 % (ref 0–1)
Eosinophils Absolute: 0.1 10*3/uL (ref 0.0–0.7)
Eosinophils Relative: 0 % (ref 0–5)
HCT: 46.9 % (ref 39.0–52.0)
Hemoglobin: 16.1 g/dL (ref 13.0–17.0)
Lymphocytes Relative: 36 % (ref 12–46)
Lymphs Abs: 4.9 10*3/uL — ABNORMAL HIGH (ref 0.7–4.0)
MCH: 30.9 pg (ref 26.0–34.0)
MCHC: 34.3 g/dL (ref 30.0–36.0)
MCV: 90 fL (ref 78.0–100.0)
Monocytes Absolute: 1.5 10*3/uL — ABNORMAL HIGH (ref 0.1–1.0)
Monocytes Relative: 11 % (ref 3–12)
Neutro Abs: 6.9 10*3/uL (ref 1.7–7.7)
Neutrophils Relative %: 53 % (ref 43–77)
Platelets: 274 10*3/uL (ref 150–400)
RBC: 5.21 MIL/uL (ref 4.22–5.81)
RDW: 12.9 % (ref 11.5–15.5)
WBC: 13.4 10*3/uL — ABNORMAL HIGH (ref 4.0–10.5)

## 2014-07-22 MED ORDER — FENTANYL CITRATE 0.05 MG/ML IJ SOLN
50.0000 ug | Freq: Once | INTRAMUSCULAR | Status: AC
  Administered 2014-07-22: 50 ug via INTRAVENOUS
  Filled 2014-07-22: qty 2

## 2014-07-22 MED ORDER — LORAZEPAM 2 MG/ML IJ SOLN
1.0000 mg | Freq: Once | INTRAMUSCULAR | Status: AC
  Administered 2014-07-22: 1 mg via INTRAVENOUS
  Filled 2014-07-22: qty 1

## 2014-07-22 NOTE — ED Notes (Signed)
Patient fell yesterday; c/p right hip pain.

## 2014-07-23 ENCOUNTER — Emergency Department (HOSPITAL_COMMUNITY): Payer: BLUE CROSS/BLUE SHIELD

## 2014-07-23 ENCOUNTER — Observation Stay (HOSPITAL_COMMUNITY): Payer: BLUE CROSS/BLUE SHIELD

## 2014-07-23 ENCOUNTER — Encounter (HOSPITAL_COMMUNITY): Payer: Self-pay | Admitting: Internal Medicine

## 2014-07-23 DIAGNOSIS — M5430 Sciatica, unspecified side: Secondary | ICD-10-CM | POA: Diagnosis present

## 2014-07-23 DIAGNOSIS — M5431 Sciatica, right side: Secondary | ICD-10-CM | POA: Diagnosis not present

## 2014-07-23 DIAGNOSIS — R404 Transient alteration of awareness: Secondary | ICD-10-CM | POA: Diagnosis present

## 2014-07-23 DIAGNOSIS — E785 Hyperlipidemia, unspecified: Secondary | ICD-10-CM

## 2014-07-23 DIAGNOSIS — F4310 Post-traumatic stress disorder, unspecified: Secondary | ICD-10-CM | POA: Diagnosis present

## 2014-07-23 LAB — BASIC METABOLIC PANEL
Anion gap: 9 (ref 5–15)
BUN: 18 mg/dL (ref 6–23)
CO2: 24 mmol/L (ref 19–32)
Calcium: 10 mg/dL (ref 8.4–10.5)
Chloride: 107 mmol/L (ref 96–112)
Creatinine, Ser: 0.91 mg/dL (ref 0.50–1.35)
GFR calc Af Amer: >90 mL/min (ref >90)
GFR calc non Af Amer: 88 mL/min — ABNORMAL LOW (ref >90)
Glucose, Bld: 128 mg/dL — ABNORMAL HIGH (ref 70–99)
Potassium: 3.7 mmol/L (ref 3.5–5.1)
Sodium: 140 mmol/L (ref 135–145)

## 2014-07-23 LAB — CBC
HCT: 48.3 % (ref 39.0–52.0)
Hemoglobin: 16.3 g/dL (ref 13.0–17.0)
MCH: 31.1 pg (ref 26.0–34.0)
MCHC: 33.7 g/dL (ref 30.0–36.0)
MCV: 92.2 fL (ref 78.0–100.0)
Platelets: 305 10*3/uL (ref 150–400)
RBC: 5.24 MIL/uL (ref 4.22–5.81)
RDW: 13.2 % (ref 11.5–15.5)
WBC: 14.1 10*3/uL — ABNORMAL HIGH (ref 4.0–10.5)

## 2014-07-23 LAB — GLUCOSE, CAPILLARY
Glucose-Capillary: 182 mg/dL — ABNORMAL HIGH (ref 70–99)
Glucose-Capillary: 148 mg/dL — ABNORMAL HIGH (ref 70–99)
Glucose-Capillary: 155 mg/dL — ABNORMAL HIGH (ref 70–99)
Glucose-Capillary: 136 mg/dL — ABNORMAL HIGH (ref 70–99)

## 2014-07-23 LAB — CBG MONITORING, ED: Glucose-Capillary: 114 mg/dL — ABNORMAL HIGH (ref 70–99)

## 2014-07-23 LAB — CREATININE, SERUM
Creatinine, Ser: 0.92 mg/dL (ref 0.50–1.35)
GFR calc Af Amer: >90 mL/min (ref >90)
GFR calc non Af Amer: 88 mL/min — ABNORMAL LOW (ref >90)

## 2014-07-23 LAB — URINALYSIS, ROUTINE W REFLEX MICROSCOPIC
Bilirubin Urine: NEGATIVE
Glucose, UA: >1000 mg/dL — AB
Hgb urine dipstick: NEGATIVE
Ketones, ur: NEGATIVE mg/dL
Leukocytes, UA: NEGATIVE
Nitrite: NEGATIVE
Protein, ur: NEGATIVE mg/dL
Specific Gravity, Urine: 1.01 (ref 1.005–1.030)
Urobilinogen, UA: 0.2 mg/dL (ref 0.0–1.0)
pH: 6 (ref 5.0–8.0)

## 2014-07-23 LAB — ETHANOL: Alcohol, Ethyl (B): <5 mg/dL (ref 0–9)

## 2014-07-23 LAB — RAPID URINE DRUG SCREEN, HOSP PERFORMED
Amphetamines: NOT DETECTED
Barbiturates: NOT DETECTED
Benzodiazepines: POSITIVE — AB
Cocaine: NOT DETECTED
Opiates: NOT DETECTED
Tetrahydrocannabinol: NOT DETECTED

## 2014-07-23 LAB — URINE MICROSCOPIC-ADD ON

## 2014-07-23 MED ORDER — SODIUM CHLORIDE 0.9 % IJ SOLN
INTRAMUSCULAR | Status: AC
  Administered 2014-07-23: 3 mL
  Filled 2014-07-23: qty 15

## 2014-07-23 MED ORDER — AMLODIPINE BESYLATE 5 MG PO TABS
10.0000 mg | ORAL_TABLET | Freq: Every day | ORAL | Status: DC
  Administered 2014-07-23 – 2014-07-25 (×3): 10 mg via ORAL
  Filled 2014-07-23 (×3): qty 2

## 2014-07-23 MED ORDER — INSULIN ASPART 100 UNIT/ML ~~LOC~~ SOLN
0.0000 [IU] | SUBCUTANEOUS | Status: DC
  Administered 2014-07-23: 2 [IU] via SUBCUTANEOUS
  Administered 2014-07-23: 3 [IU] via SUBCUTANEOUS
  Administered 2014-07-23: 2 [IU] via SUBCUTANEOUS
  Administered 2014-07-23 – 2014-07-24 (×2): 3 [IU] via SUBCUTANEOUS
  Administered 2014-07-24: 5 [IU] via SUBCUTANEOUS
  Administered 2014-07-24 – 2014-07-25 (×3): 2 [IU] via SUBCUTANEOUS

## 2014-07-23 MED ORDER — POLYETHYLENE GLYCOL 3350 17 G PO PACK: 17.0000 g | PACK | Freq: Every day | ORAL | Status: DC | PRN

## 2014-07-23 MED ORDER — EZETIMIBE 10 MG PO TABS
10.0000 mg | ORAL_TABLET | Freq: Every day | ORAL | Status: DC
  Administered 2014-07-23 – 2014-07-25 (×3): 10 mg via ORAL
  Filled 2014-07-23 (×3): qty 1

## 2014-07-23 MED ORDER — HYDROMORPHONE HCL 1 MG/ML IJ SOLN
1.0000 mg | Freq: Once | INTRAMUSCULAR | Status: AC
  Administered 2014-07-23: 1 mg via INTRAVENOUS
  Filled 2014-07-23: qty 1

## 2014-07-23 MED ORDER — KETOROLAC TROMETHAMINE 30 MG/ML IJ SOLN
30.0000 mg | Freq: Once | INTRAMUSCULAR | Status: AC
  Administered 2014-07-23: 30 mg via INTRAVENOUS
  Filled 2014-07-23: qty 1

## 2014-07-23 MED ORDER — FENTANYL CITRATE 0.05 MG/ML IJ SOLN
100.0000 ug | Freq: Once | INTRAMUSCULAR | Status: AC
  Administered 2014-07-23: 100 ug via INTRAVENOUS
  Filled 2014-07-23: qty 2

## 2014-07-23 MED ORDER — METFORMIN HCL 500 MG PO TABS
1000.0000 mg | ORAL_TABLET | Freq: Two times a day (BID) | ORAL | Status: DC
  Administered 2014-07-23 – 2014-07-25 (×5): 1000 mg via ORAL
  Filled 2014-07-23 (×5): qty 2

## 2014-07-23 MED ORDER — HEPARIN SODIUM (PORCINE) 5000 UNIT/ML IJ SOLN
5000.0000 [IU] | Freq: Three times a day (TID) | INTRAMUSCULAR | Status: DC
  Administered 2014-07-23: 5000 [IU] via SUBCUTANEOUS
  Filled 2014-07-23 (×2): qty 1

## 2014-07-23 MED ORDER — HYDROMORPHONE HCL 1 MG/ML IJ SOLN
1.0000 mg | INTRAMUSCULAR | Status: DC | PRN
  Administered 2014-07-23 – 2014-07-25 (×11): 1 mg via INTRAVENOUS
  Filled 2014-07-23 (×12): qty 1

## 2014-07-23 MED ORDER — CANAGLIFLOZIN 100 MG PO TABS
100.0000 mg | ORAL_TABLET | Freq: Every day | ORAL | Status: DC
  Administered 2014-07-23 – 2014-07-25 (×3): 100 mg via ORAL
  Filled 2014-07-23 (×4): qty 1

## 2014-07-23 MED ORDER — MORPHINE SULFATE 4 MG/ML IJ SOLN: 4.0000 mg | Freq: Once | INTRAMUSCULAR | Status: DC

## 2014-07-23 MED ORDER — LISINOPRIL 10 MG PO TABS
20.0000 mg | ORAL_TABLET | Freq: Every day | ORAL | Status: DC
  Administered 2014-07-23 – 2014-07-25 (×3): 20 mg via ORAL
  Filled 2014-07-23 (×3): qty 2

## 2014-07-23 MED ORDER — ONDANSETRON HCL 4 MG PO TABS
4.0000 mg | ORAL_TABLET | Freq: Four times a day (QID) | ORAL | Status: DC | PRN
Start: 2014-07-23 — End: 2014-07-25

## 2014-07-23 MED ORDER — ONDANSETRON HCL 4 MG/2ML IJ SOLN
4.0000 mg | Freq: Four times a day (QID) | INTRAMUSCULAR | Status: DC | PRN
  Administered 2014-07-23: 4 mg via INTRAVENOUS
  Filled 2014-07-23: qty 2

## 2014-07-23 MED ORDER — CITALOPRAM HYDROBROMIDE 20 MG PO TABS
20.0000 mg | ORAL_TABLET | Freq: Every day | ORAL | Status: DC
  Administered 2014-07-23 – 2014-07-25 (×3): 20 mg via ORAL
  Filled 2014-07-23 (×3): qty 1

## 2014-07-23 NOTE — H&P (Signed)
[facility] History and Physical  Desiree Fleming ELF:[id number] DOB: 1950/08/25    PCP:   [name], MD   Chief Complaint: acute right leg pain started 3 days ago.   HPI: Philip [name] is an 64 y.o. male with hx of PTSD, DM, HTN, HLD, panic attacks, depression, presented to the ER with a friend, complaining of right buttock pain radiating laterally to his right thigh, and paresthesia and pain down his right leg to the ankle and great toe.  He was not able to walk and was crawling a few days ago.  He also was noted to have delirium, and mistaken his friend for somebody else.  There has been no fever, chills, HA or stiffneck.  His evaluation in the ER included Xray of the right hip, showing no acute Fx, WBC of 13.4K, Hb of 16.1K, and Cr of 0.91. He was given narcotic IV, Toradol IV, and he was not able to walk, though after sleeping off, his mental status returned to baseline.  Hospitalist was asked to admit him for intractable pain, clinically suspicious for severe sciatica.  He did not have any bowel or bladder incontinence.   He had hx of back pain and had surgery before, but he couldn't elaborate, and had used steroid before, which had helped him.   Rewiew of Systems:  Constitutional: Negative for malaise, fever and chills. No significant weight loss or weight gain Eyes: Negative for eye pain, redness and discharge, diplopia, visual changes, or flashes of light. ENMT: Negative for ear pain, hoarseness, nasal congestion, sinus pressure and sore throat. No headaches; tinnitus, drooling, or problem swallowing. Cardiovascular: Negative for chest pain, palpitations, diaphoresis, dyspnea and peripheral edema. ; No orthopnea, PND Respiratory: Negative for cough, hemoptysis, wheezing and stridor. No pleuritic chestpain. Gastrointestinal: Negative for nausea, vomiting, diarrhea, constipation, abdominal pain, melena, blood in stool, hematemesis, jaundice and rectal bleeding.    Genitourinary:  Negative for frequency, dysuria, incontinence,flank pain and hematuria; Musculoskeletal: Negative for neck pain. Negative for swelling and trauma.;  Skin: . Negative for pruritus, rash, abrasions, bruising and skin lesion.; ulcerations Neuro: Negative for headache, lightheadedness and neck stiffness. Negative for weakness, altered level of consciousness , altered mental status, extremity weakness, burning feet, involuntary movement, seizure and syncope.  Psych: negative for anxiety, depression, insomnia, tearfulness, panic attacks, hallucinations, paranoia, suicidal or homicidal ideation   Past Medical History  Diagnosis Date  . Chest pain   . Diabetes mellitus without complication   . Hypertension   . Hyperlipidemia   . PTSD (post-traumatic stress disorder)     Past Surgical History  Procedure Laterality Date  . Back surgery  1999    hernitated and ruptured discs  . Tonsillectomy      Medications:  HOME MEDS: Prior to Admission medications   Medication Sig Start Date End Date Taking? Authorizing Provider  amLODipine (NORVASC) 10 MG tablet Take 1 tablet (10 mg total) by mouth daily. 05/08/13   Vernie Shanks, MD  canagliflozin (INVOKANA) 100 MG TABS tablet Take 1 tablet (100 mg total) by mouth daily. 06/13/14   [name], MD  Canagliflozin 100 MG TABS Take 1 tablet (100 mg total) by mouth every morning. 05/08/13   Vernie Shanks, MD  citalopram (CELEXA) 20 MG tablet Take 1 tablet (20 mg total) by mouth daily. 01/17/14   [name], MD  ezetimibe (ZETIA) 10 MG tablet Take 1 tablet (10 mg total) by mouth daily. 01/17/14   [name], MD  lisinopril (PRINIVIL,ZESTRIL) 20 MG tablet Take 1 tablet (20 mg total) by mouth daily. 06/13/14   [name], MD  metFORMIN (GLUCOPHAGE) 1000 MG tablet Take 1 tablet (1,000 mg total) by mouth 2 (two) times daily with a meal. 05/22/14   [name], MD  nitroGLYCERIN (NITROSTAT) 0.4 MG SL tablet Place 0.4 mg under the tongue  every 5 (five) minutes as needed for chest pain.     Historical Provider, MD  oxyCODONE-acetaminophen (ROXICET) 5-325 MG per tablet 1-2 tab every 4-6 hrs as needed 05/15/14   [name], MD  predniSONE (DELTASONE) 10 MG tablet Take 6 tab x 2 days, 4 x 2days, 2 x 2days 07/19/14   Mary-Margaret Hassell Done, FNP     Allergies:  Allergies  Allergen Reactions  . Ativan [Lorazepam] Other (See Comments)    Caused patient not to remember several days.  Marland Kitchen Blueberry [Vaccinium Angustifolium] Swelling  . Fish Oil   . Shellfish Allergy   . Ultram [Tramadol Hcl] Hives    Social History:   reports that he has been smoking.  He does not have any smokeless tobacco history on file. He reports that he does not drink alcohol or use illicit drugs.  Family History: Family History  Problem Relation Age of Onset  . Adopted: Yes     Physical Exam: Filed Vitals:   07/22/14 2158 07/23/14 0150  BP: 178/80 153/85  Pulse: 77 66  Temp: 98.2 F (36.8 C) 97.8 F (36.6 C)  TempSrc: Oral Oral  Resp: 18 18  Height: 5\' 9"  (1.753 m)   Weight: 100.699 kg (222 lb)   SpO2: 97% 100%   Blood pressure 153/85, pulse 66, temperature 97.8 F (36.6 C), temperature source Oral, resp. rate 18, height 5\' 9"  (1.753 m), weight 100.699 kg (222 lb), SpO2 100 %.  GEN:  Pleasant  patient lying in the stretcher in no acute distress; cooperative with exam. PSYCH:  alert and oriented x4; does not appear anxious or depressed; affect is appropriate. HEENT: Mucous membranes pink and anicteric; PERRLA; EOM intact; no cervical lymphadenopathy nor thyromegaly or carotid bruit; no JVD; There were no stridor. Neck is very supple. Breasts:: Not examined CHEST WALL: No tenderness CHEST: Normal respiration, clear to auscultation bilaterally.  HEART: Regular rate and rhythm.  There are no murmur, rub, or gallops.   BACK: No kyphosis or scoliosis; no CVA tenderness ABDOMEN: soft and non-tender; no masses, no organomegaly, normal  abdominal bowel sounds; no pannus; no intertriginous candida. There is no rebound and no distention. Rectal Exam: Not done EXTREMITIES: No bone or joint deformity; age-appropriate arthropathy of the hands and knees; no edema; no ulcerations.  There is no calf tenderness. Genitalia: not examined PULSES: 2+ and symmetric SKIN: Normal hydration no rash or ulceration CNS: Cranial nerves 2-12 grossly intact no focal lateralizing neurologic deficit.  Speech is fluent; uvula elevated with phonation, facial symmetry and tongue midline. DTR are slightly with slight hyperreflexia bilaterally, cerebella exam is intact, barbinski is negative and strengths are equaled bilaterally.  No sensory loss. He has strong dorsiflexion and plantar flexion, and he has good ADduction and ABduction as well.    Labs on Admission:  Basic Metabolic Panel:  Recent Labs Lab 07/22/14 2340  NA 140  K 3.7  CL 107  CO2 24  GLUCOSE 128*  BUN 18  CREATININE 0.91  CALCIUM 10.0   Liver Function Tests: No results for input(s): AST, ALT, ALKPHOS, BILITOT, PROT, ALBUMIN in the last 168 hours. No  results for input(s): LIPASE, AMYLASE in the last 168 hours. No results for input(s): AMMONIA in the last 168 hours. CBC:  Recent Labs Lab 07/22/14 2340  WBC 13.4*  NEUTROABS 6.9  HGB 16.1  HCT 46.9  MCV 90.0  PLT 274   Cardiac Enzymes: No results for input(s): CKTOTAL, CKMB, CKMBINDEX, TROPONINI in the last 168 hours.  CBG:  Recent Labs Lab 07/23/14 0005  GLUCAP 114*     Radiological Exams on Admission: Ct Head Wo Contrast  07/23/2014   CLINICAL DATA:  Right hip pain worsening today starting yesterday. Altered mental status. Flashback to [place]. No known injury.  EXAM: CT HEAD WITHOUT CONTRAST  TECHNIQUE: Contiguous axial images were obtained from the base of the skull through the vertex without intravenous contrast.  COMPARISON:  None.  FINDINGS: Ventricles and sulci appear symmetrical. No mass effect or midline  shift. No abnormal extra-axial fluid collections. Gray-white matter junctions are distinct. Basal cisterns are not effaced. No evidence of acute intracranial hemorrhage. No depressed skull fractures. Mucosal thickening in the paranasal sinuses. Mastoid air cells are not opacified.  IMPRESSION: No acute intracranial abnormalities. Mucosal thickening in the paranasal sinuses.   Electronically Signed   By: Lucienne Capers M.D.   On: 07/23/2014 02:57   Dg Hip Unilat With Pelvis 1v Right  07/22/2014   CLINICAL DATA:  Patient fell yesterday. Severe right hip pain. Altered mental status.  EXAM: RIGHT HIP (WITH PELVIS) 1 VIEW  COMPARISON:  None.  FINDINGS: Pelvis and right hip appear intact. No acute displaced fractures identified. SI joints and symphysis pubis are not displaced. No focal bone lesion or bone destruction. Bone cortex and trabecular architecture appear intact. Calcified phleboliths.  IMPRESSION: No acute bony abnormalities.   Electronically Signed   By: Lucienne Capers M.D.   On: 07/22/2014 22:47   Assessment/Plan Present on Admission:  . Sciatica . Hyperlipidemia . Panic disorder . PTSD (post-traumatic stress disorder) . Altered awareness, transient  PLAN:  Will admit him for intractable pain, clinically as sciatica.  He has no evidence of cord compression.  Will obtain MRI with contrast of the lumbar area, and consult orthopedics.  He will be given pain meds.  For his DM, will continue his home meds, and use SSI supplement.  His HTN meds will be continued as well.   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  He is lucid now, and no further intervention is need it  Other plans as per orders.  Code Status: FULL Haskel Khan, MD. [facility] Pager [phone] 7pm to 7am.  07/23/2014, 5:56 AM

## 2014-07-23 NOTE — Progress Notes (Signed)
UR completed 
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
## 2014-07-23 NOTE — Progress Notes (Signed)
Patient is seen&examined. Please see today's H&P for details Patient is admitted with R sided leg pain, without neurological weakness, paraesthesia; mental status is alert, oriented. Awaiting MRI L spine, ortho eval;  -plan of care as in H&P  Kinnie Feil  [facility] Pager [phone]. If 7PM-7AM, please contact night-coverage at [email or web address], password [facility] 07/23/2014, 9:12 AM

## 2014-07-23 NOTE — Care Management Note (Addendum)
    Page 1 of 1   07/24/2014     4:02:11 PM CARE MANAGEMENT NOTE 07/24/2014  Patient:  Philip [name],Philip [name]   Account Number:  [id number]  Date Initiated:  07/23/2014  Documentation initiated by:  Jolene Provost  Subjective/Objective Assessment:   Pt admitted for sciatic pain. Pt is from home, lives alone and independent with ADL's. Pt has a cane for PRN use but no other DME's or [place] services. Pt plans to discharge home with self care. No CM needs.     Action/Plan:   Pt was in the [employer] but when asked he says he does not recieve any benefits from the [place].   Anticipated DC Date:  07/24/2014   Anticipated DC Plan:  HOME/SELF CARE      DC Planning Services  CM consult      PAC Choice  DURABLE MEDICAL EQUIPMENT   Choice offered to / List presented to:  C-1 Patient   DME arranged  [place]      DME agency  [facility]        Status of service:  Completed, signed off Medicare Important Message given?   (If response is "NO", the following Medicare IM given date fields will be blank) Date Medicare IM given:   Medicare IM given by:   Date Additional Medicare IM given:   Additional Medicare IM given by:    Discharge Disposition:    Per UR Regulation:    If discussed at Long Length of Stay Meetings, dates discussed:    Comments:  07/24/2014 [place], RN, MSN, CM PT recommends rolling walker. Pt would like walker from [employer]. Orders faxed to store and pt will pick up walker at discharge. 07/23/2014 [place], RN, MSN, CM

## 2014-07-24 ENCOUNTER — Encounter (HOSPITAL_COMMUNITY): Payer: Self-pay | Admitting: Orthopedic Surgery

## 2014-07-24 ENCOUNTER — Ambulatory Visit: Payer: BLUE CROSS/BLUE SHIELD | Admitting: Family Medicine

## 2014-07-24 DIAGNOSIS — M5431 Sciatica, right side: Secondary | ICD-10-CM

## 2014-07-24 DIAGNOSIS — D72829 Elevated white blood cell count, unspecified: Secondary | ICD-10-CM | POA: Diagnosis present

## 2014-07-24 LAB — GLUCOSE, CAPILLARY
Glucose-Capillary: 124 mg/dL — ABNORMAL HIGH (ref 70–99)
Glucose-Capillary: 125 mg/dL — ABNORMAL HIGH (ref 70–99)
Glucose-Capillary: 143 mg/dL — ABNORMAL HIGH (ref 70–99)
Glucose-Capillary: 147 mg/dL — ABNORMAL HIGH (ref 70–99)
Glucose-Capillary: 183 mg/dL — ABNORMAL HIGH (ref 70–99)
Glucose-Capillary: 205 mg/dL — ABNORMAL HIGH (ref 70–99)

## 2014-07-24 LAB — LIPID PANEL
Cholesterol: 216 mg/dL — ABNORMAL HIGH (ref 0–200)
HDL: 35 mg/dL — ABNORMAL LOW (ref >39)
LDL Cholesterol: 131 mg/dL — ABNORMAL HIGH (ref 0–99)
Total CHOL/HDL Ratio: 6.2 RATIO
Triglycerides: 249 mg/dL — ABNORMAL HIGH (ref <150)
VLDL: 50 mg/dL — ABNORMAL HIGH (ref 0–40)

## 2014-07-24 MED ORDER — METHYLPREDNISOLONE SODIUM SUCC 40 MG IJ SOLR
40.0000 mg | Freq: Once | INTRAMUSCULAR | Status: AC
  Administered 2014-07-24: 40 mg via INTRAVENOUS
  Filled 2014-07-24: qty 1

## 2014-07-24 MED ORDER — GABAPENTIN 300 MG PO CAPS
300.0000 mg | ORAL_CAPSULE | Freq: Three times a day (TID) | ORAL | Status: DC
  Administered 2014-07-24 – 2014-07-25 (×3): 300 mg via ORAL
  Filled 2014-07-24 (×3): qty 1

## 2014-07-24 NOTE — Progress Notes (Signed)
Consult note  MRI was reviewed patient has had previous lumbar disc surgery. He has severe disease persistent and/or recurrent or progressive in his lower back based on imaging although incomplete at this time.  Recommendations for symptomatic treatment and referral to appropriate neurosurgical or orthopedic spine position.

## 2014-07-24 NOTE — Progress Notes (Signed)
PT Cancellation Note  Patient Details Name: Philip [name] MRN: [id number] DOB: 1950-04-29   Cancelled Treatment:    Reason Eval/Treat Not Completed: Other (comment).  Pt was seen at about 8:30 this AM.  He reported 12/10 pain in his back and was refusing pain med as he did not want to be drowsy when the Orthopedist came to see him.  Pt did want a walker in the room so that he could take a shower so one was provided.  It was decided that PT would be delayed until Dr. Aline Brochure came to see him.  Since then, pt has been up and taken a shower per RN report.  I have spoken with Dr. Jerilee Hoh and we will plan to wait on PT until a medical plan is made.   [name] L 07/24/2014, 12:45 PM

## 2014-07-24 NOTE — Progress Notes (Signed)
Patient found to be up without assistance in the shower.  This RN explained and educated to the patient his risk for falls due to pain and pain medication and the importance for him to have someone assist him.  Patient refuses to call for help, stating he has not fallen and has help if he needs it.  Patient's bed alarm is on.  Will continue to monitor patient.

## 2014-07-24 NOTE — Progress Notes (Signed)
[facility] PROGRESS NOTE  Philip [name] BDZ:[id number] DOB: 02-Jul-1950 DOA: 07/22/2014 PCP: [name], MD  Assessment/Plan: . Intractable right leg pain concerning for Sciatica. Hx lumbar laminectomy 1996. MRI lumbar spine concerning for disc bulging affecting L3 and L4 nerve root. No noted compression. Appreciate ortho input. Discussed with Dr Saintclair Halsted with neurosurgery who recommended IV steroids to transition to po steroids tomorrow and OP follow up tomorrow or Friday. . Hyperlipidemia: obtain lipid panel.  . Panic disorder: appears stable  . PTSD (post-traumatic stress disorder): appears stable.  . Diabetes: oral medications. Has been on steroids recently. Using SSI for optimal control. Check A1c.  Non-compliance with medical treatment: patient refusing insulin and pain medicine. Getting out of bed alone in spite of instructions. Many complaints regarding care, MRI experience. Cursing at staff. Frequent interruptions when staff trying to answer his questions  Code Status: full Family Communication: none Disposition Plan: home when ready   Consultants:  orthopedics  Procedures:  none  Antibiotics: none HPI/Subjective: Reports continued pain right leg radiating to back of knee to lateral aspect shin.   Objective: Filed Vitals:   07/24/14 0406  BP: 130/76  Pulse: 74  Temp: 98.3 F (36.8 C)  Resp: 20    Intake/Output Summary (Last 24 hours) at 07/24/14 1211 Last data filed at 07/24/14 0900  Gross per 24 hour  Intake      0 ml  Output    600 ml  Net   -600 ml   Filed Weights   07/22/14 2158  Weight: 100.699 kg (222 lb)    Exam:   General:  Well nourished appears comfortable  Cardiovascular: RRR no MGR No LE edema  Respiratory: normal effort BS clear bilaterally no wheeze  Abdomen: obese soft +BS non-tender  Musculoskeletal: bilateral LE strenght 5/5 sensation intact right leg.   Data Reviewed: Basic Metabolic Panel:  Recent Labs Lab  07/22/14 2340 07/22/14 2345  NA 140  --   K 3.7  --   CL 107  --   CO2 24  --   GLUCOSE 128*  --   BUN 18  --   CREATININE 0.91 0.92  CALCIUM 10.0  --    Liver Function Tests: No results for input(s): AST, ALT, ALKPHOS, BILITOT, PROT, ALBUMIN in the last 168 hours. No results for input(s): LIPASE, AMYLASE in the last 168 hours. No results for input(s): AMMONIA in the last 168 hours. CBC:  Recent Labs Lab 07/22/14 2340 07/22/14 2345  WBC 13.4* 14.1*  NEUTROABS 6.9  --   HGB 16.1 16.3  HCT 46.9 48.3  MCV 90.0 92.2  PLT 274 305   Cardiac Enzymes: No results for input(s): CKTOTAL, CKMB, CKMBINDEX, TROPONINI in the last 168 hours. BNP (last 3 results) No results for input(s): BNP in the last 8760 hours.  ProBNP (last 3 results) No results for input(s): PROBNP in the last 8760 hours.  CBG:  Recent Labs Lab 07/23/14 1959 07/24/14 0002 07/24/14 0409 07/24/14 0724 07/24/14 1123  GLUCAP 136* 125* 205* 124* 143*    No results found for this or any previous visit (from the past 240 hour(s)).   Studies: Ct Head Wo Contrast  07/23/2014   CLINICAL DATA:  Right hip pain worsening today starting yesterday. Altered mental status. Flashback to [place]. No known injury.  EXAM: CT HEAD WITHOUT CONTRAST  TECHNIQUE: Contiguous axial images were obtained from the base of the skull through the vertex without intravenous contrast.  COMPARISON:  None.  FINDINGS: Ventricles and  sulci appear symmetrical. No mass effect or midline shift. No abnormal extra-axial fluid collections. Gray-white matter junctions are distinct. Basal cisterns are not effaced. No evidence of acute intracranial hemorrhage. No depressed skull fractures. Mucosal thickening in the paranasal sinuses. Mastoid air cells are not opacified.  IMPRESSION: No acute intracranial abnormalities. Mucosal thickening in the paranasal sinuses.   Electronically Signed   By: Lucienne Capers M.D.   On: 07/23/2014 02:57   Mr Lumbar Spine  Wo Contrast  07/23/2014   CLINICAL DATA:  Low back and left leg pain for 1 week. Previous history of lumbar surgery.  EXAM: MRI LUMBAR SPINE WITHOUT CONTRAST  TECHNIQUE: Multiplanar, multisequence MR imaging of the lumbar spine was performed. No intravenous contrast was administered.  COMPARISON:  None.  FINDINGS: Examination is limited due to motion degradation. Patient refused to finish the examination. No STIR sagittal images or postcontrast imaging was performed. Normal overall alignment of the lumbar vertebral bodies. They demonstrate normal marrow signal. Multilevel Schmorl's nodes are noted. The facets are normally aligned. No pars defects. No significant paraspinal or retroperitoneal findings. Mild bladder distention is noted.  Axial images are limited.  L1-2: Mild bulging annulus asymmetric right with minimal impression on the thecal sac. No spinal or foraminal stenosis.  L2-3: Bulging degenerated annulus and mild osteophytic ridging with mild bilateral lateral recess encroachment. There is also a annular fissure and a small right paracentral disc protrusion with caudal down turning. There is mild mass effect on the right side of the thecal sac and this could affect the right L3 nerve root in the lateral recess. No spinal stenosis. No foraminal stenosis.  L3-4: Diffuse bulging annulus, osteophytic ridging and facet disease contribute to mild bilateral lateral recess stenosis. No significant spinal or foraminal stenosis.  L4-5: Mild bulging annulus and moderate facet disease with flattening of the ventral thecal sac and mild bilateral lateral recess encroachment. Shallow right foraminal disc protrusion appears to contact and slightly displace the right L4 nerve root.  L5-S1: Left-sided laminectomy defect noted. There is a diffuse bulging annulus but no spinal stenosis. There is a left foraminal disc osteophyte complex which narrows the neural foramen it may affect the left L5 nerve root.  IMPRESSION: 1.  Degenerative lumbar spondylosis with multilevel disc disease and facet disease. 2. Limited examination by motion degradation. Patient also refused the postcontrast imaging. 3. Annular fissure and small right paracentral disc protrusion and L2-3. This may affect the right L3 nerve root in the lateral recess. 4. Shallow right foraminal disc protrusion at L4-5 possibly irritating the right L4 nerve root. 5. Postoperative changes at L5-S1. There is a left foraminal disc osteophyte complex with left foraminal narrowing which could affect the left L5 nerve root.   Electronically Signed   By: Marijo Sanes M.D.   On: 07/23/2014 13:38   Dg Hip Unilat With Pelvis 1v Right  07/22/2014   CLINICAL DATA:  Patient fell yesterday. Severe right hip pain. Altered mental status.  EXAM: RIGHT HIP (WITH PELVIS) 1 VIEW  COMPARISON:  None.  FINDINGS: Pelvis and right hip appear intact. No acute displaced fractures identified. SI joints and symphysis pubis are not displaced. No focal bone lesion or bone destruction. Bone cortex and trabecular architecture appear intact. Calcified phleboliths.  IMPRESSION: No acute bony abnormalities.   Electronically Signed   By: Lucienne Capers M.D.   On: 07/22/2014 22:47    Scheduled Meds: . amLODipine  10 mg Oral Daily  . canagliflozin  100 mg Oral Daily  .  citalopram  20 mg Oral Daily  . ezetimibe  10 mg Oral Daily  . heparin  5,000 Units Subcutaneous 3 times per day  . insulin aspart  0-15 Units Subcutaneous 6 times per day  . lisinopril  20 mg Oral Daily  . metFORMIN  1,000 mg Oral BID WC   Continuous Infusions:   Principal Problem:   Sciatica Active Problems:   Hypertension   Dyslipidemia   Diabetes   Non compliance with medical treatment   Panic disorder   Hyperlipidemia   PTSD (post-traumatic stress disorder)   Altered awareness, transient   Leukocytosis    Time spent: [facility] Pager [phone]. If 7PM-7AM, please contact  night-coverage at [email or web address], password [facility] 07/24/2014, 12:11 PM

## 2014-07-24 NOTE — Consult Note (Addendum)
Reason for Consult: Back pain with right leg radiculopathy Referring Physician: Dr Levin Bacon is an 64 y.o. male.  HPI: 64 year old male with previous lumbar laminectomies back in 1999 presents with right buttock pain and right leg pain radiating to his right great toe. 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 Chest pain   . Diabetes mellitus without complication   . Hypertension   . Hyperlipidemia   . PTSD (post-traumatic stress disorder)     Past Surgical History  Procedure Laterality Date  . Tonsillectomy    . Back surgery  1999    hernitated and ruptured discs    Family History  Problem Relation Age of Onset  . Adopted: Yes    Social History:  reports that he has been smoking.  He does not have any smokeless tobacco history on file. He reports that he does not drink alcohol or use illicit drugs.  Allergies:  Allergies  Allergen Reactions  . Ativan [Lorazepam] Other (See Comments)    Caused patient not to remember several days.  Marland Kitchen Blueberry [Vaccinium Angustifolium] Swelling  . Fish Oil   . Shellfish Allergy   . Ultram [Tramadol Hcl] Hives    Medications: I have reviewed the patient's current medications.  Results for orders placed or performed during the hospital encounter of 07/22/14 (from the past 48 hour(s))  Ethanol     Status: None   Collection Time: 07/22/14 11:30 PM  Result Value Ref Range   Alcohol, Ethyl (B) <5 0 - 9 mg/dL    Comment:        LOWEST DETECTABLE LIMIT FOR SERUM ALCOHOL IS 11 mg/dL FOR MEDICAL PURPOSES ONLY   Basic metabolic panel     Status: Abnormal   Collection Time: 07/22/14 11:40 PM  Result Value Ref Range   Sodium 140 135 - 145 mmol/L   Potassium 3.7 3.5 - 5.1 mmol/L   Chloride 107 96 - 112 mmol/L   CO2 24 19 - 32 mmol/L   Glucose, Bld 128 (H) 70 - 99 mg/dL   BUN 18 6 - 23 mg/dL   Creatinine, Ser 0.91  0.50 - 1.35 mg/dL   Calcium 10.0 8.4 - 10.5 mg/dL   GFR calc non Af Amer 88 (L) >90 mL/min   GFR calc Af Amer >90 >90 mL/min    Comment: (NOTE) The eGFR has been calculated using the CKD EPI equation. This calculation has not been validated in all clinical situations. eGFR's persistently <90 mL/min signify possible Chronic Kidney Disease.    Anion gap 9 5 - 15  CBC with Differential     Status: Abnormal   Collection Time: 07/22/14 11:40 PM  Result Value Ref Range   WBC 13.4 (H) 4.0 - 10.5 K/uL   RBC 5.21 4.22 - 5.81 MIL/uL   Hemoglobin 16.1 13.0 - 17.0 g/dL   HCT 46.9 39.0 - 52.0 %   MCV 90.0 78.0 - 100.0 fL   MCH 30.9 26.0 - 34.0 pg   MCHC 34.3 30.0 - 36.0 g/dL   RDW 12.9 11.5 - 15.5 %   Platelets 274 150 - 400 K/uL   Neutrophils Relative % 53 43 - 77 %   Neutro Abs 6.9 1.7 - 7.7 K/uL   Lymphocytes Relative 36 12 - 46 %   Lymphs Abs 4.9 (H) 0.7 - 4.0 K/uL   Monocytes Relative 11 3 -  12 %   Monocytes Absolute 1.5 (H) 0.1 - 1.0 K/uL   Eosinophils Relative 0 0 - 5 %   Eosinophils Absolute 0.1 0.0 - 0.7 K/uL   Basophils Relative 0 0 - 1 %   Basophils Absolute 0.0 0.0 - 0.1 K/uL  CBC     Status: Abnormal   Collection Time: 07/22/14 11:45 PM  Result Value Ref Range   WBC 14.1 (H) 4.0 - 10.5 K/uL   RBC 5.24 4.22 - 5.81 MIL/uL   Hemoglobin 16.3 13.0 - 17.0 g/dL   HCT 48.3 39.0 - 52.0 %   MCV 92.2 78.0 - 100.0 fL   MCH 31.1 26.0 - 34.0 pg   MCHC 33.7 30.0 - 36.0 g/dL   RDW 13.2 11.5 - 15.5 %   Platelets 305 150 - 400 K/uL  Creatinine, serum     Status: Abnormal   Collection Time: 07/22/14 11:45 PM  Result Value Ref Range   Creatinine, Ser 0.92 0.50 - 1.35 mg/dL   GFR calc non Af Amer 88 (L) >90 mL/min   GFR calc Af Amer >90 >90 mL/min    Comment: (NOTE) The eGFR has been calculated using the CKD EPI equation. This calculation has not been validated in all clinical situations. eGFR's persistently <90 mL/min signify possible Chronic Kidney Disease.   POC CBG, ED      Status: Abnormal   Collection Time: 07/23/14 12:05 AM  Result Value Ref Range   Glucose-Capillary 114 (H) 70 - 99 mg/dL  Drug screen panel, emergency     Status: Abnormal   Collection Time: 07/23/14  1:25 AM  Result Value Ref Range   Opiates NONE DETECTED NONE DETECTED   Cocaine NONE DETECTED NONE DETECTED   Benzodiazepines POSITIVE (A) NONE DETECTED   Amphetamines NONE DETECTED NONE DETECTED   Tetrahydrocannabinol NONE DETECTED NONE DETECTED   Barbiturates NONE DETECTED NONE DETECTED    Comment:        DRUG SCREEN FOR MEDICAL PURPOSES ONLY.  IF CONFIRMATION IS NEEDED FOR ANY PURPOSE, NOTIFY LAB WITHIN 5 DAYS.        LOWEST DETECTABLE LIMITS FOR URINE DRUG SCREEN Drug Class       Cutoff (ng/mL) Amphetamine      1000 Barbiturate      200 Benzodiazepine   176 Tricyclics       160 Opiates          300 Cocaine          300 THC              50   Urinalysis, Routine w reflex microscopic     Status: Abnormal   Collection Time: 07/23/14  1:25 AM  Result Value Ref Range   Color, Urine YELLOW YELLOW   APPearance CLEAR CLEAR   Specific Gravity, Urine 1.010 1.005 - 1.030   pH 6.0 5.0 - 8.0   Glucose, UA >1000 (A) NEGATIVE mg/dL   Hgb urine dipstick NEGATIVE NEGATIVE   Bilirubin Urine NEGATIVE NEGATIVE   Ketones, ur NEGATIVE NEGATIVE mg/dL   Protein, ur NEGATIVE NEGATIVE mg/dL   Urobilinogen, UA 0.2 0.0 - 1.0 mg/dL   Nitrite NEGATIVE NEGATIVE   Leukocytes, UA NEGATIVE NEGATIVE  Urine microscopic-add on     Status: None   Collection Time: 07/23/14  1:25 AM  Result Value Ref Range   WBC, UA 0-2 <3 WBC/hpf   Bacteria, UA RARE RARE  Glucose, capillary     Status: Abnormal   Collection Time: 07/23/14  8:12 AM  Result Value Ref Range   Glucose-Capillary 182 (H) 70 - 99 mg/dL  Glucose, capillary     Status: Abnormal   Collection Time: 07/23/14 12:00 PM  Result Value Ref Range   Glucose-Capillary 148 (H) 70 - 99 mg/dL   Comment 1 Notify RN   Glucose, capillary     Status:  Abnormal   Collection Time: 07/23/14  5:02 PM  Result Value Ref Range   Glucose-Capillary 155 (H) 70 - 99 mg/dL   Comment 1 Notify RN   Glucose, capillary     Status: Abnormal   Collection Time: 07/23/14  7:59 PM  Result Value Ref Range   Glucose-Capillary 136 (H) 70 - 99 mg/dL   Comment 1 Notify RN   Glucose, capillary     Status: Abnormal   Collection Time: 07/24/14 12:02 AM  Result Value Ref Range   Glucose-Capillary 125 (H) 70 - 99 mg/dL   Comment 1 Notify RN   Glucose, capillary     Status: Abnormal   Collection Time: 07/24/14  4:09 AM  Result Value Ref Range   Glucose-Capillary 205 (H) 70 - 99 mg/dL   Comment 1 Notify RN   Glucose, capillary     Status: Abnormal   Collection Time: 07/24/14  7:24 AM  Result Value Ref Range   Glucose-Capillary 124 (H) 70 - 99 mg/dL  Glucose, capillary     Status: Abnormal   Collection Time: 07/24/14 11:23 AM  Result Value Ref Range   Glucose-Capillary 143 (H) 70 - 99 mg/dL    Ct Head Wo Contrast  07/23/2014   CLINICAL DATA:  Right hip pain worsening today starting yesterday. Altered mental status. Flashback to [place]. No known injury.  EXAM: CT HEAD WITHOUT CONTRAST  TECHNIQUE: Contiguous axial images were obtained from the base of the skull through the vertex without intravenous contrast.  COMPARISON:  None.  FINDINGS: Ventricles and sulci appear symmetrical. No mass effect or midline shift. No abnormal extra-axial fluid collections. Gray-white matter junctions are distinct. Basal cisterns are not effaced. No evidence of acute intracranial hemorrhage. No depressed skull fractures. Mucosal thickening in the paranasal sinuses. Mastoid air cells are not opacified.  IMPRESSION: No acute intracranial abnormalities. Mucosal thickening in the paranasal sinuses.   Electronically Signed   By: Lucienne Capers M.D.   On: 07/23/2014 02:57   Mr Lumbar Spine Wo Contrast  07/23/2014   CLINICAL DATA:  Low back and left leg pain for 1 week. Previous history of  lumbar surgery.  EXAM: MRI LUMBAR SPINE WITHOUT CONTRAST  TECHNIQUE: Multiplanar, multisequence MR imaging of the lumbar spine was performed. No intravenous contrast was administered.  COMPARISON:  None.  FINDINGS: Examination is limited due to motion degradation. Patient refused to finish the examination. No STIR sagittal images or postcontrast imaging was performed. Normal overall alignment of the lumbar vertebral bodies. They demonstrate normal marrow signal. Multilevel Schmorl's nodes are noted. The facets are normally aligned. No pars defects. No significant paraspinal or retroperitoneal findings. Mild bladder distention is noted.  Axial images are limited.  L1-2: Mild bulging annulus asymmetric right with minimal impression on the thecal sac. No spinal or foraminal stenosis.  L2-3: Bulging degenerated annulus and mild osteophytic ridging with mild bilateral lateral recess encroachment. There is also a annular fissure and a small right paracentral disc protrusion with caudal down turning. There is mild mass effect on the right side of the thecal sac and this could affect the right L3 nerve root in  the lateral recess. No spinal stenosis. No foraminal stenosis.  L3-4: Diffuse bulging annulus, osteophytic ridging and facet disease contribute to mild bilateral lateral recess stenosis. No significant spinal or foraminal stenosis.  L4-5: Mild bulging annulus and moderate facet disease with flattening of the ventral thecal sac and mild bilateral lateral recess encroachment. Shallow right foraminal disc protrusion appears to contact and slightly displace the right L4 nerve root.  L5-S1: Left-sided laminectomy defect noted. There is a diffuse bulging annulus but no spinal stenosis. There is a left foraminal disc osteophyte complex which narrows the neural foramen it may affect the left L5 nerve root.  IMPRESSION: 1. Degenerative lumbar spondylosis with multilevel disc disease and facet disease. 2. Limited examination by  motion degradation. Patient also refused the postcontrast imaging. 3. Annular fissure and small right paracentral disc protrusion and L2-3. This may affect the right L3 nerve root in the lateral recess. 4. Shallow right foraminal disc protrusion at L4-5 possibly irritating the right L4 nerve root. 5. Postoperative changes at L5-S1. There is a left foraminal disc osteophyte complex with left foraminal narrowing which could affect the left L5 nerve root.   Electronically Signed   By: Marijo Sanes M.D.   On: 07/23/2014 13:38   Dg Hip Unilat With Pelvis 1v Right  07/22/2014   CLINICAL DATA:  Patient fell yesterday. Severe right hip pain. Altered mental status.  EXAM: RIGHT HIP (WITH PELVIS) 1 VIEW  COMPARISON:  None.  FINDINGS: Pelvis and right hip appear intact. No acute displaced fractures identified. SI joints and symphysis pubis are not displaced. No focal bone lesion or bone destruction. Bone cortex and trabecular architecture appear intact. Calcified phleboliths.  IMPRESSION: No acute bony abnormalities.   Electronically Signed   By: Lucienne Capers M.D.   On: 07/22/2014 22:47    ROS Blood pressure 130/76, pulse 74, temperature 98.3 F (36.8 C), temperature source Oral, resp. rate 20, height 5' 9"  (1.753 m), weight 222 lb (100.699 kg), SpO2 95 %. Physical Exam   Today he is awake alert and oriented no evidence of psychosis. His overall body habitus is mesomorphic Gait not observed Patient is comfortable in the right recumbent position supine. He has painful straight leg raise has painful Lasegue's test he has mild weakness in the dorsiflexion of his right foot he has decreased sensation in the L5 root distribution reflexes are hyperreflexic at the knee muscle tone is normal. Knee hip and ankle are stable without ligament laxity Left leg is normal. Upper extremities no problems noted. Right buttock tender.   Assessment/Plan: MRI scan reviewed he has obvious right foraminal disc protrusion at  L4-5 with possible L4 root irritation along with other degenerative changes and spondylosis there is also possible right L3 nerve root impingement from my paracentral disc protrusion and his pain is severe and unremitting.  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  Arther Abbott 07/24/2014, 1:03 PM

## 2014-07-24 NOTE — Progress Notes (Signed)
UR completed 

## 2014-07-25 DIAGNOSIS — M5431 Sciatica, right side: Secondary | ICD-10-CM | POA: Diagnosis not present

## 2014-07-25 LAB — GLUCOSE, CAPILLARY
Glucose-Capillary: 127 mg/dL — ABNORMAL HIGH (ref 70–99)
Glucose-Capillary: 132 mg/dL — ABNORMAL HIGH (ref 70–99)
Glucose-Capillary: 148 mg/dL — ABNORMAL HIGH (ref 70–99)

## 2014-07-25 LAB — HEMOGLOBIN A1C
Hgb A1c MFr Bld: 8 % — ABNORMAL HIGH (ref 4.8–5.6)
Mean Plasma Glucose: 183 mg/dL

## 2014-07-25 MED ORDER — LISINOPRIL 20 MG PO TABS: 20.0000 mg | ORAL_TABLET | Freq: Every day | ORAL | Status: DC

## 2014-07-25 MED ORDER — HYDROCODONE-ACETAMINOPHEN 5-300 MG PO TABS: 1.0000 | ORAL_TABLET | Freq: Four times a day (QID) | ORAL | Status: DC | PRN

## 2014-07-25 MED ORDER — EZETIMIBE 10 MG PO TABS: 10.0000 mg | ORAL_TABLET | Freq: Every day | ORAL | Status: DC

## 2014-07-25 MED ORDER — PREDNISONE 20 MG PO TABS
60.0000 mg | ORAL_TABLET | Freq: Every day | ORAL | Status: DC
  Administered 2014-07-25: 60 mg via ORAL
  Filled 2014-07-25: qty 3

## 2014-07-25 MED ORDER — CITALOPRAM HYDROBROMIDE 20 MG PO TABS: 20.0000 mg | ORAL_TABLET | Freq: Every day | ORAL | Status: DC

## 2014-07-25 MED ORDER — GABAPENTIN 300 MG PO CAPS: 300.0000 mg | ORAL_CAPSULE | Freq: Three times a day (TID) | ORAL | Status: DC

## 2014-07-25 MED ORDER — PREDNISONE 10 MG PO TABS: ORAL_TABLET | ORAL | Status: DC

## 2014-07-25 MED ORDER — CANAGLIFLOZIN 100 MG PO TABS: 100.0000 mg | ORAL_TABLET | Freq: Every day | ORAL | Status: DC

## 2014-07-25 NOTE — Progress Notes (Signed)
Reviewed discharge instructions with pt, questions answered.  IV removed, pt tolerated well.  Will continue to monitor pt until leaves the floor.

## 2014-07-25 NOTE — Discharge Summary (Signed)
Physician Discharge Summary  Philip [name] ZWC:[id number] DOB: 07-13-1950 DOA: 07/22/2014  PCP: [name], MD  Admit date: 07/22/2014 Discharge date: 07/25/2014  Time spent: 40 minutes  Recommendations for Outpatient Follow-up:  1. Call Dr Windy Carina office tomorrow for appointment 2. PCP 1-2 weeks for follow up to lipid panel.    Discharge Diagnoses:  Principal Problem:   Sciatica Active Problems:   Hypertension   Dyslipidemia   Diabetes   Non compliance with medical treatment   Panic disorder   Hyperlipidemia   PTSD (post-traumatic stress disorder)   Altered awareness, transient   Leukocytosis   Discharge Condition: stable  Diet recommendation: carb modified  Filed Weights   07/22/14 2158  Weight: 100.699 kg (222 lb)    History of present illness:  Philip [name] is an 64 y.o. male with hx of PTSD, DM, HTN, HLD, panic attacks, depression, presented to the ER  On 07/23/14 with a friend, complaining of right buttock pain radiating laterally to his right thigh, and paresthesia and pain down his right leg to the ankle and great toe. He was not able to walk and was crawling a few days prior. He also was noted to have delirium, and mistaken his friend for somebody else. There had been no fever, chills, HA or stiffneck. His evaluation in the ER included Xray of the right hip, showing no acute Fx, WBC of 13.4K, Hb of 16.1K, and Cr of 0.91. He was given narcotic IV, Toradol IV, and he was not able to walk, though after sleeping, his mental status returned to baseline. He did not have any bowel or bladder incontinence. He had hx of back pain and had surgery before and had used steroid before, which had helped him.   Hospital Course:  . Intractable right leg pain likely related to bulging discs at L4- L5 with possible L4 root irritation as noted in MRI. No noted compression. Evaluated by ortho who recommended steroids and neurosurgery consult. Discussed with Dr Saintclair Halsted neurosurgery who  recommended IV steroids 07/24/14 to transition to po steroids at discharge and close OP follow up. He received solumedrol 40mg  and was discharged with prednisone taper. He indicated he wanted to call for appointment vs staff arranging as his schedule "tight". At discharge pain much improved. Ambulating with steady gait albeit slowly.   . Hyperlipidemia: lipid panel with cholesterol 216, triglycerides 249, VLDL 50 and LDL 50. Continue home meds. Follow up with PCP  . Panic disorder: remained stable   . PTSD (post-traumatic stress disorder): appeared stable.   . Diabetes: oral medications. Has been on steroids recently.  A1c 8.0.   Non-compliance with medical treatment: patient initially refused insulin and pain medicine. got out of bed alone in spite of instructions. Many complaints regarding care, MRI experience. Cursing at staff. Frequent interruptions when staff trying to answer his questions   Procedures:  notes  Consultations:  Phone conversation with Dr Saintclair Halsted  Discharge Exam: Filed Vitals:   07/25/14 0440  BP: 132/71  Pulse: 81  Temp: 98.8 F (37.1 C)  Resp: 18    General: well nourished Cardiovascular: RRR no MGR No  Respiratory: normal effort BS clear bilaterally  Discharge Instructions   Discharge Instructions    Diet - low sodium heart healthy    Complete by:  As directed      Discharge instructions    Complete by:  As directed   Take medication as directed Call Dr Windy Carina office tomorrow for appointment Follow up with PCP 2  weeks track cholesterol and evaluate diabetes control     Increase activity slowly    Complete by:  As directed           Current Discharge Medication List    START taking these medications   Details  gabapentin (NEURONTIN) 300 MG capsule Take 1 capsule (300 mg total) by mouth 3 (three) times daily. Qty: 20 capsule, Refills: 0    Hydrocodone-Acetaminophen 5-300 MG TABS Take 1 tablet by mouth every 6 (six) hours as needed. Qty: 30  each, Refills: 0      CONTINUE these medications which have CHANGED   Details  canagliflozin (INVOKANA) 100 MG TABS tablet Take 1 tablet (100 mg total) by mouth daily.    citalopram (CELEXA) 20 MG tablet Take 1 tablet (20 mg total) by mouth daily.    ezetimibe (ZETIA) 10 MG tablet Take 1 tablet (10 mg total) by mouth daily.    lisinopril (PRINIVIL,ZESTRIL) 20 MG tablet Take 1 tablet (20 mg total) by mouth daily.    predniSONE (DELTASONE) 10 MG tablet Take 5 tabs starting 07/26/14 then take 4 tabs for 1 day then take 3 tabs for 1 day then take 2 tabs for 1 day then take 1 tab for 1 day then stop Qty: 15 tablet, Refills: 0      CONTINUE these medications which have NOT CHANGED   Details  amLODipine (NORVASC) 10 MG tablet Take 1 tablet (10 mg total) by mouth daily. Qty: 90 tablet, Refills: 3   Associated Diagnoses: Hypertension    metFORMIN (GLUCOPHAGE) 1000 MG tablet Take 1 tablet (1,000 mg total) by mouth 2 (two) times daily with a meal. Qty: 180 tablet, Refills: 1   Associated Diagnoses: Diabetes mellitus without complication    nitroGLYCERIN (NITROSTAT) 0.4 MG SL tablet Place 0.4 mg under the tongue every 5 (five) minutes as needed for chest pain.        Allergies  Allergen Reactions  . Ativan [Lorazepam] Other (See Comments)    Caused patient not to remember several days.  Marland Kitchen Blueberry [Vaccinium Angustifolium] Swelling  . Fish Oil   . Shellfish Allergy   . Ultram [Tramadol Hcl] Hives   Follow-up Information    Follow up with CRAM,GARY P, MD. Call in 1 day.   Specialty:  Neurosurgery   Why:  call office for appointment   Contact information:   [address] [place] [address] [place] [address] [phone]       Follow up with [name], MD. Schedule an appointment as soon as possible for a visit in 2 weeks.   Specialty:  Family Medicine   Why:  follow up on Lipid panel   Contact information:   [place] [address] [phone]         The results of significant diagnostics from this hospitalization (including imaging, microbiology, ancillary and laboratory) are listed below for reference.    Significant Diagnostic Studies: Ct Head Wo Contrast  07/23/2014   CLINICAL DATA:  Right hip pain worsening today starting yesterday. Altered mental status. Flashback to [place]. No known injury.  EXAM: CT HEAD WITHOUT CONTRAST  TECHNIQUE: Contiguous axial images were obtained from the base of the skull through the vertex without intravenous contrast.  COMPARISON:  None.  FINDINGS: Ventricles and sulci appear symmetrical. No mass effect or midline shift. No abnormal extra-axial fluid collections. Gray-white matter junctions are distinct. Basal cisterns are not effaced. No evidence of acute intracranial hemorrhage. No depressed skull fractures.  Mucosal thickening in the paranasal sinuses. Mastoid air cells are not opacified.  IMPRESSION: No acute intracranial abnormalities. Mucosal thickening in the paranasal sinuses.   Electronically Signed   By: Lucienne Capers M.D.   On: 07/23/2014 02:57   Mr Lumbar Spine Wo Contrast  07/23/2014   CLINICAL DATA:  Low back and left leg pain for 1 week. Previous history of lumbar surgery.  EXAM: MRI LUMBAR SPINE WITHOUT CONTRAST  TECHNIQUE: Multiplanar, multisequence MR imaging of the lumbar spine was performed. No intravenous contrast was administered.  COMPARISON:  None.  FINDINGS: Examination is limited due to motion degradation. Patient refused to finish the examination. No STIR sagittal images or postcontrast imaging was performed. Normal overall alignment of the lumbar vertebral bodies. They demonstrate normal marrow signal. Multilevel Schmorl's nodes are noted. The facets are normally aligned. No pars defects. No significant paraspinal or retroperitoneal findings. Mild bladder distention is noted.  Axial images are limited.  L1-2: Mild bulging annulus asymmetric right with minimal impression on the thecal  sac. No spinal or foraminal stenosis.  L2-3: Bulging degenerated annulus and mild osteophytic ridging with mild bilateral lateral recess encroachment. There is also a annular fissure and a small right paracentral disc protrusion with caudal down turning. There is mild mass effect on the right side of the thecal sac and this could affect the right L3 nerve root in the lateral recess. No spinal stenosis. No foraminal stenosis.  L3-4: Diffuse bulging annulus, osteophytic ridging and facet disease contribute to mild bilateral lateral recess stenosis. No significant spinal or foraminal stenosis.  L4-5: Mild bulging annulus and moderate facet disease with flattening of the ventral thecal sac and mild bilateral lateral recess encroachment. Shallow right foraminal disc protrusion appears to contact and slightly displace the right L4 nerve root.  L5-S1: Left-sided laminectomy defect noted. There is a diffuse bulging annulus but no spinal stenosis. There is a left foraminal disc osteophyte complex which narrows the neural foramen it may affect the left L5 nerve root.  IMPRESSION: 1. Degenerative lumbar spondylosis with multilevel disc disease and facet disease. 2. Limited examination by motion degradation. Patient also refused the postcontrast imaging. 3. Annular fissure and small right paracentral disc protrusion and L2-3. This may affect the right L3 nerve root in the lateral recess. 4. Shallow right foraminal disc protrusion at L4-5 possibly irritating the right L4 nerve root. 5. Postoperative changes at L5-S1. There is a left foraminal disc osteophyte complex with left foraminal narrowing which could affect the left L5 nerve root.   Electronically Signed   By: Marijo Sanes M.D.   On: 07/23/2014 13:38   Dg Hip Unilat With Pelvis 1v Right  07/22/2014   CLINICAL DATA:  Patient fell yesterday. Severe right hip pain. Altered mental status.  EXAM: RIGHT HIP (WITH PELVIS) 1 VIEW  COMPARISON:  None.  FINDINGS: Pelvis and right  hip appear intact. No acute displaced fractures identified. SI joints and symphysis pubis are not displaced. No focal bone lesion or bone destruction. Bone cortex and trabecular architecture appear intact. Calcified phleboliths.  IMPRESSION: No acute bony abnormalities.   Electronically Signed   By: Lucienne Capers M.D.   On: 07/22/2014 22:47    Microbiology: No results found for this or any previous visit (from the past 240 hour(s)).   Labs: Basic Metabolic Panel:  Recent Labs Lab 07/22/14 2340 07/22/14 2345  NA 140  --   K 3.7  --   CL 107  --   CO2 24  --  GLUCOSE 128*  --   BUN 18  --   CREATININE 0.91 0.92  CALCIUM 10.0  --    Liver Function Tests: No results for input(s): AST, ALT, ALKPHOS, BILITOT, PROT, ALBUMIN in the last 168 hours. No results for input(s): LIPASE, AMYLASE in the last 168 hours. No results for input(s): AMMONIA in the last 168 hours. CBC:  Recent Labs Lab 07/22/14 2340 07/22/14 2345  WBC 13.4* 14.1*  NEUTROABS 6.9  --   HGB 16.1 16.3  HCT 46.9 48.3  MCV 90.0 92.2  PLT 274 305   Cardiac Enzymes: No results for input(s): CKTOTAL, CKMB, CKMBINDEX, TROPONINI in the last 168 hours. BNP: BNP (last 3 results) No results for input(s): BNP in the last 8760 hours.  ProBNP (last 3 results) No results for input(s): PROBNP in the last 8760 hours.  CBG:  Recent Labs Lab 07/24/14 1624 07/24/14 2056 07/25/14 0004 07/25/14 0439 07/25/14 0730  GLUCAP 147* 183* 148* 132* 127*       Signed:  Tanara Turvey M  [facility] 07/25/2014, 11:03 AM

## 2014-07-26 ENCOUNTER — Ambulatory Visit: Payer: BC Managed Care – PPO | Admitting: Family Medicine

## 2014-07-27 ENCOUNTER — Inpatient Hospital Stay (HOSPITAL_COMMUNITY)
Admission: EM | Admit: 2014-07-27 | Discharge: 2014-08-02 | DRG: 520 | Disposition: A | Discharge status: Home / Self Care | Payer: BLUE CROSS/BLUE SHIELD | Attending: Neurological Surgery | Admitting: Neurological Surgery

## 2014-07-27 ENCOUNTER — Encounter (HOSPITAL_COMMUNITY): Payer: Self-pay | Admitting: Emergency Medicine

## 2014-07-27 DIAGNOSIS — Z79899 Other long term (current) drug therapy: Secondary | ICD-10-CM

## 2014-07-27 DIAGNOSIS — Z91013 Allergy to seafood: Secondary | ICD-10-CM

## 2014-07-27 DIAGNOSIS — R5381 Other malaise: Secondary | ICD-10-CM | POA: Diagnosis present

## 2014-07-27 DIAGNOSIS — E785 Hyperlipidemia, unspecified: Secondary | ICD-10-CM | POA: Diagnosis present

## 2014-07-27 DIAGNOSIS — M5431 Sciatica, right side: Secondary | ICD-10-CM

## 2014-07-27 DIAGNOSIS — I152 Hypertension secondary to endocrine disorders: Secondary | ICD-10-CM | POA: Diagnosis present

## 2014-07-27 DIAGNOSIS — M5116 Intervertebral disc disorders with radiculopathy, lumbar region: Secondary | ICD-10-CM | POA: Diagnosis present

## 2014-07-27 DIAGNOSIS — M2578 Osteophyte, vertebrae: Secondary | ICD-10-CM | POA: Diagnosis present

## 2014-07-27 DIAGNOSIS — F17210 Nicotine dependence, cigarettes, uncomplicated: Secondary | ICD-10-CM | POA: Diagnosis present

## 2014-07-27 DIAGNOSIS — M5441 Lumbago with sciatica, right side: Secondary | ICD-10-CM

## 2014-07-27 DIAGNOSIS — IMO0002 Reserved for concepts with insufficient information to code with codable children: Secondary | ICD-10-CM

## 2014-07-27 DIAGNOSIS — F418 Other specified anxiety disorders: Secondary | ICD-10-CM | POA: Diagnosis present

## 2014-07-27 DIAGNOSIS — D72829 Elevated white blood cell count, unspecified: Secondary | ICD-10-CM | POA: Diagnosis present

## 2014-07-27 DIAGNOSIS — Z7982 Long term (current) use of aspirin: Secondary | ICD-10-CM

## 2014-07-27 DIAGNOSIS — I10 Essential (primary) hypertension: Secondary | ICD-10-CM | POA: Diagnosis present

## 2014-07-27 DIAGNOSIS — F4310 Post-traumatic stress disorder, unspecified: Secondary | ICD-10-CM | POA: Diagnosis present

## 2014-07-27 DIAGNOSIS — E1140 Type 2 diabetes mellitus with diabetic neuropathy, unspecified: Secondary | ICD-10-CM | POA: Diagnosis present

## 2014-07-27 DIAGNOSIS — Z888 Allergy status to other drugs, medicaments and biological substances status: Secondary | ICD-10-CM | POA: Diagnosis not present

## 2014-07-27 DIAGNOSIS — M549 Dorsalgia, unspecified: Secondary | ICD-10-CM

## 2014-07-27 DIAGNOSIS — M5430 Sciatica, unspecified side: Secondary | ICD-10-CM | POA: Diagnosis present

## 2014-07-27 DIAGNOSIS — E118 Type 2 diabetes mellitus with unspecified complications: Secondary | ICD-10-CM | POA: Diagnosis not present

## 2014-07-27 DIAGNOSIS — E119 Type 2 diabetes mellitus without complications: Secondary | ICD-10-CM | POA: Diagnosis not present

## 2014-07-27 DIAGNOSIS — R059 Cough, unspecified: Secondary | ICD-10-CM

## 2014-07-27 DIAGNOSIS — R05 Cough: Secondary | ICD-10-CM

## 2014-07-27 DIAGNOSIS — Z9889 Other specified postprocedural states: Secondary | ICD-10-CM

## 2014-07-27 LAB — COMPREHENSIVE METABOLIC PANEL
ALT: 40 U/L (ref 0–53)
AST: 28 U/L (ref 0–37)
Albumin: 4 g/dL (ref 3.5–5.2)
Alkaline Phosphatase: 77 U/L (ref 39–117)
Anion gap: 9 (ref 5–15)
BUN: 19 mg/dL (ref 6–23)
CO2: 22 mmol/L (ref 19–32)
Calcium: 9.8 mg/dL (ref 8.4–10.5)
Chloride: 107 mmol/L (ref 96–112)
Creatinine, Ser: 0.86 mg/dL (ref 0.50–1.35)
GFR calc Af Amer: >90 mL/min (ref >90)
GFR calc non Af Amer: >90 mL/min (ref >90)
Glucose, Bld: 165 mg/dL — ABNORMAL HIGH (ref 70–99)
Potassium: 3.8 mmol/L (ref 3.5–5.1)
Sodium: 138 mmol/L (ref 135–145)
Total Bilirubin: 0.7 mg/dL (ref 0.3–1.2)
Total Protein: 6.7 g/dL (ref 6.0–8.3)

## 2014-07-27 LAB — CBC WITH DIFFERENTIAL/PLATELET
Basophils Absolute: 0 10*3/uL (ref 0.0–0.1)
Basophils Relative: 0 % (ref 0–1)
Eosinophils Absolute: 0.1 10*3/uL (ref 0.0–0.7)
Eosinophils Relative: 0 % (ref 0–5)
HCT: 48.1 % (ref 39.0–52.0)
Hemoglobin: 16.6 g/dL (ref 13.0–17.0)
Lymphocytes Relative: 19 % (ref 12–46)
Lymphs Abs: 3.3 10*3/uL (ref 0.7–4.0)
MCH: 31.3 pg (ref 26.0–34.0)
MCHC: 34.5 g/dL (ref 30.0–36.0)
MCV: 90.8 fL (ref 78.0–100.0)
Monocytes Absolute: 1.9 10*3/uL — ABNORMAL HIGH (ref 0.1–1.0)
Monocytes Relative: 11 % (ref 3–12)
Neutro Abs: 11.9 10*3/uL — ABNORMAL HIGH (ref 1.7–7.7)
Neutrophils Relative %: 70 % (ref 43–77)
Platelets: 309 10*3/uL (ref 150–400)
RBC: 5.3 MIL/uL (ref 4.22–5.81)
RDW: 13.4 % (ref 11.5–15.5)
WBC: 17.2 10*3/uL — ABNORMAL HIGH (ref 4.0–10.5)

## 2014-07-27 LAB — URINALYSIS, ROUTINE W REFLEX MICROSCOPIC
Bilirubin Urine: NEGATIVE
Glucose, UA: >1000 mg/dL — AB
Hgb urine dipstick: NEGATIVE
Leukocytes, UA: NEGATIVE
Nitrite: NEGATIVE
Protein, ur: NEGATIVE mg/dL
Specific Gravity, Urine: 1.02 (ref 1.005–1.030)
Urobilinogen, UA: 0.2 mg/dL (ref 0.0–1.0)
pH: 6 (ref 5.0–8.0)

## 2014-07-27 LAB — CREATININE, SERUM
Creatinine, Ser: 0.82 mg/dL (ref 0.50–1.35)
GFR calc Af Amer: >90 mL/min (ref >90)
GFR calc non Af Amer: >90 mL/min (ref >90)

## 2014-07-27 LAB — CBC
HCT: 45 % (ref 39.0–52.0)
Hemoglobin: 15 g/dL (ref 13.0–17.0)
MCH: 30 pg (ref 26.0–34.0)
MCHC: 33.3 g/dL (ref 30.0–36.0)
MCV: 90 fL (ref 78.0–100.0)
Platelets: 302 10*3/uL (ref 150–400)
RBC: 5 MIL/uL (ref 4.22–5.81)
RDW: 13.4 % (ref 11.5–15.5)
WBC: 12.1 10*3/uL — ABNORMAL HIGH (ref 4.0–10.5)

## 2014-07-27 LAB — URINE MICROSCOPIC-ADD ON

## 2014-07-27 LAB — GLUCOSE, CAPILLARY: Glucose-Capillary: 145 mg/dL — ABNORMAL HIGH (ref 70–99)

## 2014-07-27 MED ORDER — HYDROMORPHONE HCL 1 MG/ML IJ SOLN
1.0000 mg | Freq: Once | INTRAMUSCULAR | Status: AC
  Administered 2014-07-27: 1 mg via INTRAVENOUS
  Filled 2014-07-27: qty 1

## 2014-07-27 MED ORDER — ONDANSETRON HCL 4 MG PO TABS: 4.0000 mg | ORAL_TABLET | Freq: Four times a day (QID) | ORAL | Status: DC | PRN

## 2014-07-27 MED ORDER — LISINOPRIL 20 MG PO TABS
20.0000 mg | ORAL_TABLET | Freq: Every day | ORAL | Status: DC
  Administered 2014-07-27 – 2014-08-02 (×7): 20 mg via ORAL
  Filled 2014-07-27 (×6): qty 1

## 2014-07-27 MED ORDER — INSULIN ASPART 100 UNIT/ML ~~LOC~~ SOLN
3.0000 [IU] | Freq: Three times a day (TID) | SUBCUTANEOUS | Status: DC
  Administered 2014-07-28 – 2014-08-02 (×12): 3 [IU] via SUBCUTANEOUS

## 2014-07-27 MED ORDER — INSULIN ASPART 100 UNIT/ML ~~LOC~~ SOLN
0.0000 [IU] | Freq: Three times a day (TID) | SUBCUTANEOUS | Status: DC
  Administered 2014-07-27: 1 [IU] via SUBCUTANEOUS
  Administered 2014-07-28 (×3): 2 [IU] via SUBCUTANEOUS
  Administered 2014-07-29: 3 [IU] via SUBCUTANEOUS
  Administered 2014-07-29 – 2014-07-30 (×4): 2 [IU] via SUBCUTANEOUS
  Administered 2014-07-31: 1 [IU] via SUBCUTANEOUS
  Administered 2014-07-31 – 2014-08-02 (×3): 2 [IU] via SUBCUTANEOUS

## 2014-07-27 MED ORDER — DIAZEPAM 5 MG/ML IJ SOLN
5.0000 mg | Freq: Once | INTRAMUSCULAR | Status: AC
  Administered 2014-07-27: 5 mg via INTRAVENOUS
  Filled 2014-07-27: qty 2

## 2014-07-27 MED ORDER — HEPARIN SODIUM (PORCINE) 5000 UNIT/ML IJ SOLN
5000.0000 [IU] | Freq: Three times a day (TID) | INTRAMUSCULAR | Status: DC
  Filled 2014-07-27 (×2): qty 1

## 2014-07-27 MED ORDER — SODIUM CHLORIDE 0.9 % IV BOLUS (SEPSIS)
1000.0000 mL | Freq: Once | INTRAVENOUS | Status: AC
  Administered 2014-07-27: 1000 mL via INTRAVENOUS

## 2014-07-27 MED ORDER — KETOROLAC TROMETHAMINE 30 MG/ML IJ SOLN
30.0000 mg | Freq: Once | INTRAMUSCULAR | Status: AC
  Administered 2014-07-27: 30 mg via INTRAVENOUS
  Filled 2014-07-27: qty 1

## 2014-07-27 MED ORDER — ONDANSETRON HCL 4 MG/2ML IJ SOLN: 4.0000 mg | Freq: Four times a day (QID) | INTRAMUSCULAR | Status: DC | PRN

## 2014-07-27 MED ORDER — SODIUM CHLORIDE 0.9 % IV SOLN
INTRAVENOUS | Status: DC
  Administered 2014-07-27 – 2014-07-29 (×3): via INTRAVENOUS

## 2014-07-27 MED ORDER — GABAPENTIN 300 MG PO CAPS
300.0000 mg | ORAL_CAPSULE | Freq: Three times a day (TID) | ORAL | Status: DC
  Administered 2014-07-27 – 2014-08-02 (×18): 300 mg via ORAL
  Filled 2014-07-27 (×17): qty 1

## 2014-07-27 MED ORDER — HYDROMORPHONE HCL 1 MG/ML IJ SOLN
0.5000 mg | Freq: Once | INTRAMUSCULAR | Status: AC
  Administered 2014-07-27: 0.5 mg via INTRAVENOUS
  Filled 2014-07-27: qty 1

## 2014-07-27 MED ORDER — MORPHINE SULFATE 2 MG/ML IJ SOLN
1.0000 mg | INTRAMUSCULAR | Status: DC | PRN
  Administered 2014-07-27 – 2014-07-29 (×4): 1 mg via INTRAVENOUS
  Filled 2014-07-27 (×3): qty 1

## 2014-07-27 MED ORDER — LORAZEPAM 2 MG/ML IJ SOLN: 1.0000 mg | Freq: Once | INTRAMUSCULAR | Status: DC

## 2014-07-27 MED ORDER — HYDROMORPHONE HCL 1 MG/ML IJ SOLN
INTRAMUSCULAR | Status: AC
  Filled 2014-07-27: qty 1

## 2014-07-27 MED ORDER — METHYLPREDNISOLONE SODIUM SUCC 125 MG IJ SOLR
125.0000 mg | Freq: Once | INTRAMUSCULAR | Status: AC
  Administered 2014-07-27: 125 mg via INTRAVENOUS
  Filled 2014-07-27: qty 2

## 2014-07-27 MED ORDER — ACETAMINOPHEN 325 MG PO TABS: 650.0000 mg | ORAL_TABLET | Freq: Four times a day (QID) | ORAL | Status: DC | PRN

## 2014-07-27 MED ORDER — DOCUSATE SODIUM 100 MG PO CAPS
100.0000 mg | ORAL_CAPSULE | Freq: Two times a day (BID) | ORAL | Status: DC
  Administered 2014-07-27 – 2014-07-29 (×4): 100 mg via ORAL
  Filled 2014-07-27 (×6): qty 1

## 2014-07-27 MED ORDER — ACETAMINOPHEN 650 MG RE SUPP: 650.0000 mg | Freq: Four times a day (QID) | RECTAL | Status: DC | PRN

## 2014-07-27 MED ORDER — OXYCODONE HCL 5 MG PO TABS
5.0000 mg | ORAL_TABLET | ORAL | Status: DC | PRN
  Administered 2014-07-27 – 2014-08-01 (×12): 5 mg via ORAL
  Filled 2014-07-27 (×13): qty 1

## 2014-07-27 MED ORDER — HYDROMORPHONE HCL 1 MG/ML IJ SOLN
1.0000 mg | Freq: Once | INTRAMUSCULAR | Status: AC
  Administered 2014-07-27: 1 mg via INTRAVENOUS

## 2014-07-27 MED ORDER — SENNOSIDES-DOCUSATE SODIUM 8.6-50 MG PO TABS: 1.0000 | ORAL_TABLET | Freq: Every evening | ORAL | Status: DC | PRN

## 2014-07-27 MED ORDER — CITALOPRAM HYDROBROMIDE 10 MG PO TABS
20.0000 mg | ORAL_TABLET | Freq: Every day | ORAL | Status: DC
  Administered 2014-07-27 – 2014-08-02 (×7): 20 mg via ORAL
  Filled 2014-07-27 (×4): qty 1
  Filled 2014-07-27: qty 2
  Filled 2014-07-27: qty 1
  Filled 2014-07-27: qty 2

## 2014-07-27 NOTE — ED Provider Notes (Signed)
CSN: [id number]     Arrival date & time 07/27/14  0705 History   First MD Initiated Contact with Patient 07/27/14 [phone]     Chief Complaint  Patient presents with  . Back Pain     (Consider location/radiation/quality/duration/timing/severity/associated sxs/prior Treatment) Patient is a 64 y.o. male presenting with back pain. The history is provided by the patient (the pt was discharged home thursday with back pain and sciatica.   pt states hi pain is worse and he cannot walk).  Back Pain Location:  Lumbar spine Quality:  Aching Radiates to:  R foot Pain severity:  Severe Onset quality:  Sudden Timing:  Constant Progression:  Worsening Chronicity:  Recurrent Context: not emotional stress   Associated symptoms: no abdominal pain, no chest pain and no headaches     Past Medical History  Diagnosis Date  . Chest pain   . Diabetes mellitus without complication   . Hypertension   . Hyperlipidemia   . PTSD (post-traumatic stress disorder)    Past Surgical History  Procedure Laterality Date  . Tonsillectomy    . Back surgery  1999    hernitated and ruptured discs  . Tonsillectomy     Family History  Problem Relation Age of Onset  . Adopted: Yes   History  Substance Use Topics  . Smoking status: Current Some Day Smoker -- 0.25 packs/day  . Smokeless tobacco: Not on file  . Alcohol Use: No    Review of Systems  Constitutional: Negative for appetite change and fatigue.  HENT: Negative for congestion, ear discharge and sinus pressure.   Eyes: Negative for discharge.  Respiratory: Negative for cough.   Cardiovascular: Negative for chest pain.  Gastrointestinal: Negative for abdominal pain and diarrhea.  Genitourinary: Negative for frequency and hematuria.  Musculoskeletal: Positive for back pain.  Skin: Negative for rash.  Neurological: Negative for seizures and headaches.  Psychiatric/Behavioral: Negative for hallucinations.      Allergies  Ativan; Blueberry; Fish  oil; Shellfish allergy; and Ultram  Home Medications   Prior to Admission medications   Medication Sig Start Date End Date Taking? Authorizing Provider  aspirin EC 81 MG tablet Take 81 mg by mouth daily.   Yes Historical Provider, MD  canagliflozin (INVOKANA) 100 MG TABS tablet Take 1 tablet (100 mg total) by mouth daily. 07/25/14  Yes Lezlie Octave Black, NP  citalopram (CELEXA) 20 MG tablet Take 1 tablet (20 mg total) by mouth daily. 07/25/14  Yes Lezlie Octave Black, NP  gabapentin (NEURONTIN) 300 MG capsule Take 1 capsule (300 mg total) by mouth 3 (three) times daily. 07/25/14  Yes Lezlie Octave Black, NP  Hydrocodone-Acetaminophen 5-300 MG TABS Take 1 tablet by mouth every 6 (six) hours as needed. 07/25/14  Yes Lezlie Octave Black, NP  lisinopril (PRINIVIL,ZESTRIL) 20 MG tablet Take 1 tablet (20 mg total) by mouth daily. 07/25/14  Yes Radene Gunning, NP  metFORMIN (GLUCOPHAGE) 1000 MG tablet Take 1 tablet (1,000 mg total) by mouth 2 (two) times daily with a meal. 05/22/14  Yes [name], MD  nitroGLYCERIN (NITROSTAT) 0.4 MG SL tablet Place 0.4 mg under the tongue every 5 (five) minutes as needed for chest pain.    Yes Historical Provider, MD  predniSONE (DELTASONE) 10 MG tablet Take 5 tabs starting 07/26/14 then take 4 tabs for 1 day then take 3 tabs for 1 day then take 2 tabs for 1 day then take 1 tab for 1 day then stop 07/25/14  Yes Lezlie Octave  Black, NP  amLODipine (NORVASC) 10 MG tablet Take 1 tablet (10 mg total) by mouth daily. Patient not taking: Reported on 07/27/2014 05/08/13   Vernie Shanks, MD  ezetimibe (ZETIA) 10 MG tablet Take 1 tablet (10 mg total) by mouth daily. Patient not taking: Reported on 07/27/2014 07/25/14   Lezlie Octave Black, NP   BP 160/85 mmHg  Pulse 105  Temp(Src) 98.4 F (36.9 C) (Oral)  Resp 17  Ht 5' 8.5" (1.74 m)  Wt 220 lb (99.791 kg)  BMI 32.96 kg/m2  SpO2 98% Physical Exam  Constitutional: He is oriented to person, place, and time. He appears well-developed.  HENT:  Head: Normocephalic.   Eyes: Conjunctivae and EOM are normal. No scleral icterus.  Neck: Neck supple. No thyromegaly present.  Cardiovascular: Normal rate and regular rhythm.  Exam reveals no gallop and no friction rub.   No murmur heard. Pulmonary/Chest: No stridor. He has no wheezes. He has no rales. He exhibits no tenderness.  Abdominal: He exhibits no distension. There is no tenderness. There is no rebound.  Genitourinary:  Normal rectal  Musculoskeletal: Normal range of motion. He exhibits no edema.  Lymphadenopathy:    He has no cervical adenopathy.  Neurological: He is alert and oriented to person, place, and time. He has normal reflexes. No cranial nerve deficit. He exhibits normal muscle tone. Coordination normal.  Pos. Straight leg raise right.    Pt unable to stand and put weight on right leg 2nd to pain  Skin: No rash noted. No erythema.  Psychiatric: He has a normal mood and affect. His behavior is normal.    ED Course  Procedures (including critical care time) Labs Review Labs Reviewed  CBC WITH DIFFERENTIAL/PLATELET - Abnormal; Notable for the following:    WBC 17.2 (*)    Neutro Abs 11.9 (*)    Monocytes Absolute 1.9 (*)    All other components within normal limits  COMPREHENSIVE METABOLIC PANEL - Abnormal; Notable for the following:    Glucose, Bld 165 (*)    All other components within normal limits  URINALYSIS, ROUTINE W REFLEX MICROSCOPIC - Abnormal; Notable for the following:    APPearance HAZY (*)    Glucose, UA >1000 (*)    Ketones, ur TRACE (*)    All other components within normal limits  URINE MICROSCOPIC-ADD ON - Abnormal; Notable for the following:    Bacteria, UA FEW (*)    Casts GRANULAR CAST (*)    All other components within normal limits    Imaging Review No results found.   EKG Interpretation None      MDM   Final diagnoses:  Sciatica neuralgia, right    Sciatica.  Spoke with dr. Ronnald Ramp who will see the pt in consult at [facility]       Milton Ferguson,  MD 07/27/14 1249

## 2014-07-27 NOTE — ED Notes (Signed)
Pt reports chronic back pain "flare-up" since last Thursday. Pt reports was admitted for back pain and discharged home Thursday. Pt reports has back surgery scheduled for next week. Pt alert and oriented. Pt cbg en route 173.

## 2014-07-27 NOTE — ED Notes (Addendum)
[facility] on unit to transport patient to [facility].

## 2014-07-27 NOTE — ED Notes (Signed)
Pt currently on cell phone talking with a friend, laughing. No moaning at this time.

## 2014-07-27 NOTE — ED Notes (Signed)
Pt states she was supposed to call the doctor yesterday but his "helper" lost the paperwork with the numbers. He was referred to a neurosurgeon at [facility] but was never able to call due to not having the paperwork. Pt was admitted for this last week. States bad pain began again yesterday.

## 2014-07-27 NOTE — ED Notes (Signed)
Patient reports last normal BM was 1.5 weeks ago.  Has not been able to defecate d/t pain with sitting and bearing down.

## 2014-07-27 NOTE — ED Notes (Addendum)
After being medicated. O2 sats dropped upper 70's. Pt was placed on O2 at 3L [place] and is maintaining in upper 90's

## 2014-07-27 NOTE — ED Notes (Signed)
Pt returned from US. NAD.

## 2014-07-27 NOTE — H&P (Signed)
[facility]          History and Physical    PCP:   [name], MD   Chief Complaint:  Back pain, right leg pain  HPI: Patient is a 64 year old man well-known to me for recent hospitalization for the same complaints. He has a history of hypertension and diabetes. During prior hospitalization he had an MRI of his lumbar spine that showed an annular fissure and small right paracentral disc protrusion at L2-3 which may affect the right L3 nerve root in the lateral recess. There is also a shallow right foraminal disc protrusion at L4-5 possibly irritating the right L4 nerve root. There was also a left foraminal disc osteophyte complex with left foraminal narrowing which could affect the left L5 nerve root. Above findings were discussed via telephone with neurosurgeon, Dr. Saintclair Halsted, who recommended a dose of IV steroids followed by an oral prednisone taper and discharge from the hospital to follow-up with him in 2 days. Patient did not allow for [place] to make appointment for him. He was discharged. He returns to the emergency department today saying that ever since the IV pain medication that he received on day of discharge wore off he has been in intractable pain, again unable to get off the floor and has been crawling on the floor ever since. EDP requested admission for pain control. I requested that he contact neurosurgeon in [place] for recommendations on how to proceed. He relates that after speaking with Dr. Sherley Bounds he has recommended transfer to [facility] where he will see patient in consultation and consider possibility of a local anesthetic block for pain relief.   Allergies:   Allergies  Allergen Reactions  . Ativan [Lorazepam] Other (See Comments)    Caused patient not to remember several days.  Marland Kitchen Blueberry [Vaccinium Angustifolium] Swelling  . Fish Oil   . Shellfish Allergy   . Ultram [Tramadol Hcl] Hives      Past Medical History  Diagnosis Date    . Chest pain   . Diabetes mellitus without complication   . Hypertension   . Hyperlipidemia   . PTSD (post-traumatic stress disorder)     Past Surgical History  Procedure Laterality Date  . Tonsillectomy    . Back surgery  1999    hernitated and ruptured discs  . Tonsillectomy      Prior to Admission medications   Medication Sig Start Date End Date Taking? Authorizing Provider  aspirin EC 81 MG tablet Take 81 mg by mouth daily.   Yes Historical Provider, MD  canagliflozin (INVOKANA) 100 MG TABS tablet Take 1 tablet (100 mg total) by mouth daily. 07/25/14  Yes Lezlie Octave Black, NP  citalopram (CELEXA) 20 MG tablet Take 1 tablet (20 mg total) by mouth daily. 07/25/14  Yes Lezlie Octave Black, NP  gabapentin (NEURONTIN) 300 MG capsule Take 1 capsule (300 mg total) by mouth 3 (three) times daily. 07/25/14  Yes Lezlie Octave Black, NP  Hydrocodone-Acetaminophen 5-300 MG TABS Take 1 tablet by mouth every 6 (six) hours as needed. 07/25/14  Yes Lezlie Octave Black, NP  lisinopril (PRINIVIL,ZESTRIL) 20 MG tablet Take 1 tablet (20 mg total) by mouth daily. 07/25/14  Yes Radene Gunning, NP  metFORMIN (GLUCOPHAGE) 1000 MG tablet Take 1 tablet (1,000 mg total) by mouth 2 (two) times daily with a meal. 05/22/14  Yes [name], MD  nitroGLYCERIN (NITROSTAT) 0.4  MG SL tablet Place 0.4 mg under the tongue every 5 (five) minutes as needed for chest pain.    Yes Historical Provider, MD  predniSONE (DELTASONE) 10 MG tablet Take 5 tabs starting 07/26/14 then take 4 tabs for 1 day then take 3 tabs for 1 day then take 2 tabs for 1 day then take 1 tab for 1 day then stop 07/25/14  Yes Lezlie Octave Black, NP  amLODipine (NORVASC) 10 MG tablet Take 1 tablet (10 mg total) by mouth daily. Patient not taking: Reported on 07/27/2014 05/08/13   Vernie Shanks, MD  ezetimibe (ZETIA) 10 MG tablet Take 1 tablet (10 mg total) by mouth daily. Patient not taking: Reported on 07/27/2014 07/25/14   Radene Gunning, NP    Social History:  reports that he has been  smoking.  He does not have any smokeless tobacco history on file. He reports that he does not drink alcohol or use illicit drugs.  Family History  Problem Relation Age of Onset  . Adopted: Yes    Review of Systems:  Constitutional: Denies fever, chills, diaphoresis, appetite change and fatigue.  HEENT: Denies photophobia, eye pain, redness, hearing loss, ear pain, congestion, sore throat, rhinorrhea, sneezing, mouth sores, trouble swallowing, neck pain, neck stiffness and tinnitus.   Respiratory: Denies SOB, DOE, cough, chest tightness,  and wheezing.   Cardiovascular: Denies chest pain, palpitations and leg swelling.  Gastrointestinal: Denies nausea, vomiting, abdominal pain, diarrhea, constipation, blood in stool and abdominal distention.  Genitourinary: Denies dysuria, urgency, frequency, hematuria, flank pain and difficulty urinating.  Endocrine: Denies: hot or cold intolerance, sweats, changes in hair or nails, polyuria, polydipsia. Musculoskeletal: Denies myalgias, , joint swelling, arthralgias. Skin: Denies pallor, rash and wound.  Neurological: Denies dizziness, seizures, syncope, weakness, light-headedness, numbness and headaches.  Hematological: Denies adenopathy. Easy bruising, personal or family bleeding history  Psychiatric/Behavioral: Denies suicidal ideation, mood changes, confusion, nervousness, sleep disturbance and agitation   Physical Exam: Blood pressure 160/85, pulse 105, temperature 98.4 F (36.9 C), temperature source Oral, resp. rate 17, height 5' 8.5" (1.74 m), weight 99.791 kg (220 lb), SpO2 98 %.  general: Alert, awake, oriented 3 HEENT: Normocephalic, atraumatic, pupils equal round and reactive to light, intact extraocular movements, moist mucous membranes, poor dentition, mild pharyngeal erythema. Neck: Supple, no JVD, no lymphadenopathy, no bruits, no goiter. Cardiovascular: Regular rate and rhythm, no murmurs, rubs or gallops. Lungs: Clear to rotation  bilaterally. Abdomen: Soft, nontender, nondistended, positive bowel sounds, no masses or organomegaly noted. Extremities: No clubbing, cyanosis or edema, positive pedal pulses. Neurologic: Mental status intact, cranial nerves II through XII are intact, decreased sensation in proprioception on the right foot all the way up to his thigh.  Labs on Admission:  Results for orders placed or performed during the hospital encounter of 07/27/14 (from the past 48 hour(s))  CBC with Differential/Platelet     Status: Abnormal   Collection Time: 07/27/14  8:05 AM  Result Value Ref Range   WBC 17.2 (H) 4.0 - 10.5 K/uL   RBC 5.30 4.22 - 5.81 MIL/uL   Hemoglobin 16.6 13.0 - 17.0 g/dL   HCT 48.1 39.0 - 52.0 %   MCV 90.8 78.0 - 100.0 fL   MCH 31.3 26.0 - 34.0 pg   MCHC 34.5 30.0 - 36.0 g/dL   RDW 13.4 11.5 - 15.5 %   Platelets 309 150 - 400 K/uL   Neutrophils Relative % 70 43 - 77 %   Neutro Abs 11.9 (H) 1.7 -  7.7 K/uL   Lymphocytes Relative 19 12 - 46 %   Lymphs Abs 3.3 0.7 - 4.0 K/uL   Monocytes Relative 11 3 - 12 %   Monocytes Absolute 1.9 (H) 0.1 - 1.0 K/uL   Eosinophils Relative 0 0 - 5 %   Eosinophils Absolute 0.1 0.0 - 0.7 K/uL   Basophils Relative 0 0 - 1 %   Basophils Absolute 0.0 0.0 - 0.1 K/uL  Comprehensive metabolic panel     Status: Abnormal   Collection Time: 07/27/14  8:05 AM  Result Value Ref Range   Sodium 138 135 - 145 mmol/L   Potassium 3.8 3.5 - 5.1 mmol/L   Chloride 107 96 - 112 mmol/L   CO2 22 19 - 32 mmol/L   Glucose, Bld 165 (H) 70 - 99 mg/dL   BUN 19 6 - 23 mg/dL   Creatinine, Ser 0.86 0.50 - 1.35 mg/dL   Calcium 9.8 8.4 - 10.5 mg/dL   Total Protein 6.7 6.0 - 8.3 g/dL   Albumin 4.0 3.5 - 5.2 g/dL   AST 28 0 - 37 U/L   ALT 40 0 - 53 U/L   Alkaline Phosphatase 77 39 - 117 U/L   Total Bilirubin 0.7 0.3 - 1.2 mg/dL   GFR calc non Af Amer >90 >90 mL/min   GFR calc Af Amer >90 >90 mL/min    Comment: (NOTE) The eGFR has been calculated using the CKD EPI  equation. This calculation has not been validated in all clinical situations. eGFR's persistently <90 mL/min signify possible Chronic Kidney Disease.    Anion gap 9 5 - 15  Urinalysis, Routine w reflex microscopic     Status: Abnormal   Collection Time: 07/27/14 10:07 AM  Result Value Ref Range   Color, Urine YELLOW YELLOW   APPearance HAZY (A) CLEAR   Specific Gravity, Urine 1.020 1.005 - 1.030   pH 6.0 5.0 - 8.0   Glucose, UA >1000 (A) NEGATIVE mg/dL   Hgb urine dipstick NEGATIVE NEGATIVE   Bilirubin Urine NEGATIVE NEGATIVE   Ketones, ur TRACE (A) NEGATIVE mg/dL   Protein, ur NEGATIVE NEGATIVE mg/dL   Urobilinogen, UA 0.2 0.0 - 1.0 mg/dL   Nitrite NEGATIVE NEGATIVE   Leukocytes, UA NEGATIVE NEGATIVE  Urine microscopic-add on     Status: Abnormal   Collection Time: 07/27/14 10:07 AM  Result Value Ref Range   WBC, UA 0-2 <3 WBC/hpf   Bacteria, UA FEW (A) RARE   Casts GRANULAR CAST (A) NEGATIVE    Radiological Exams on Admission: No results found.  Assessment/Plan Principal Problem:   Sciatica Active Problems:   Hypertension    sciatica, back pain -Related to findings on MRI of the lumbar spine. -We'll plan for admission and transfer to [facility] at the request of Dr. Sherley Bounds who will see patient in consultation to consider possibility of a local anesthetic block versus surgical options.  Hypertension -Currently well controlled. -Continue home medications.  Type 2 diabetes -Hold metformin and interval, while in the hospital. -Continue sliding scale insulin.     Time Spent on Admission: 85 minutes   [facility] Pager: [phone] 07/27/2014, 1:39 PM

## 2014-07-27 NOTE — ED Notes (Signed)
Stood patient w/use of walker.  Severe pain in R lumbar spine and R leg. Dr. Roderic Palau present to observe.  Patient back to bed with HOB flat for comfort.

## 2014-07-28 ENCOUNTER — Encounter (HOSPITAL_COMMUNITY): Payer: Self-pay | Admitting: Neurological Surgery

## 2014-07-28 DIAGNOSIS — E119 Type 2 diabetes mellitus without complications: Secondary | ICD-10-CM

## 2014-07-28 DIAGNOSIS — M5431 Sciatica, right side: Secondary | ICD-10-CM

## 2014-07-28 DIAGNOSIS — I10 Essential (primary) hypertension: Secondary | ICD-10-CM

## 2014-07-28 LAB — GLUCOSE, CAPILLARY
Glucose-Capillary: 148 mg/dL — ABNORMAL HIGH (ref 70–99)
Glucose-Capillary: 171 mg/dL — ABNORMAL HIGH (ref 70–99)
Glucose-Capillary: 162 mg/dL — ABNORMAL HIGH (ref 70–99)
Glucose-Capillary: 160 mg/dL — ABNORMAL HIGH (ref 70–99)

## 2014-07-28 MED ORDER — HYDROMORPHONE HCL 1 MG/ML IJ SOLN
1.0000 mg | INTRAMUSCULAR | Status: DC | PRN
  Administered 2014-07-28 – 2014-07-29 (×7): 1 mg via INTRAVENOUS
  Filled 2014-07-28 (×7): qty 1

## 2014-07-28 NOTE — Consult Note (Signed)
Reason for Consult:HNP Referring Physician: Hospitalist  Philip [name] is an 64 y.o. male.   HPI:  64 year old gentleman who is status post right L2-3 microdiscectomy and left L5-S1 microdiscectomy in the remote past by Dr. Ernestene Mention who presents with a two-week history of progressive severe right leg pain. The pain seems to be in an L4 and/or L5 distribution. It is both aching and burning in character. He states he is much worse with standing or walking. He has tried oral steroids and analgesics without relief. He has been to the emergency department twice and is now admitted with severe pain. No bowel or bladder changes. MRI showed the previous laminectomies but also showed a right foraminal disc herniation at L4-5 decompression the right L4 nerve root.  Past Medical History  Diagnosis Date  . Chest pain   . Diabetes mellitus without complication   . Hypertension   . Hyperlipidemia   . PTSD (post-traumatic stress disorder)     Past Surgical History  Procedure Laterality Date  . Tonsillectomy    . Back surgery  1999    hernitated and ruptured discs  . Tonsillectomy      Allergies  Allergen Reactions  . Ativan [Lorazepam] Other (See Comments)    Caused patient not to remember several days.  Marland Kitchen Blueberry [Vaccinium Angustifolium] Swelling  . Fish Oil   . Shellfish Allergy   . Ultram [Tramadol Hcl] Hives    History  Substance Use Topics  . Smoking status: Current Some Day Smoker -- 0.25 packs/day  . Smokeless tobacco: Not on file  . Alcohol Use: No    Family History  Problem Relation Age of Onset  . Adopted: Yes     Review of Systems  Positive ROS: neg  All other systems have been reviewed and were otherwise negative with the exception of those mentioned in the HPI and as above.  Objective: Vital signs in last 24 hours: Temp:  [98.4 F (36.9 C)-99 F (37.2 C)] 98.8 F (37.1 C) (04/10 1305) Pulse Rate:  [83-94] 94 (04/10 1305) Resp:  [16-18] 16 (04/10 1305) BP:  (133-149)/(63-82) 134/72 mmHg (04/10 1305) SpO2:  [94 %-98 %] 96 % (04/10 1305)  General Appearance: Alert, cooperative, no distress, appears stated age, lying flat in bed in some discomfort Head: Normocephalic, without obvious abnormality, atraumatic Eyes: PERRL, conjunctiva/corneas clear, EOM's intact Lungs:  respirations unlabored   NEUROLOGIC:   Mental status: A&O x4, no aphasia, good attention span, Memory and fund of knowledge Motor Exam - grossly normal, normal tone and bulk Sensory Exam - grossly normal Coordination - not tested Gait - not tested Balance -not tested Cranial Nerves: I: smell Not tested  II: visual acuity  OS: na    OD: na  II: visual fields Full to confrontation  II: pupils Equal, round, reactive to light  III,VII: ptosis None  III,IV,VI: extraocular muscles  Full ROM  V: mastication Normal  V: facial light touch sensation  Normal  V,VII: corneal reflex  Present  VII: facial muscle function - upper  Normal  VII: facial muscle function - lower Normal  VIII: hearing Not tested  IX: soft palate elevation  Normal  IX,X: gag reflex Present  XI: trapezius strength  5/5  XI: sternocleidomastoid strength 5/5  XI: neck flexion strength  5/5  XII: tongue strength  Normal    Data Review Lab Results  Component Value Date   WBC 12.1* 07/27/2014   HGB 15.0 07/27/2014   HCT 45.0 07/27/2014   MCV  90.0 07/27/2014   PLT 302 07/27/2014   Lab Results  Component Value Date   NA 138 07/27/2014   K 3.8 07/27/2014   CL 107 07/27/2014   CO2 22 07/27/2014   BUN 19 07/27/2014   CREATININE 0.82 07/27/2014   GLUCOSE 165* 07/27/2014   No results found for: INR, PROTIME  Radiology: No results found. MRI: This shows the previous decompressive hemilaminectomies at L2-3 and L5-S1. I think the symptomatic lesion is of right L4-5 extraforaminal and foraminal disc protrusion compressing the right L4 nerve root  Assessment/Plan: Right L4-5 foraminal and extraforaminal  disc protrusion with right L4 radiculopathy. His pain is quite severe. Continue analgesics for now. I've spoken with interventional radiology and they're going to perform a right L4 nerve root block tomorrow. I have stopped his heparin for this procedure. Hopefully we can get his pain under control he can follow-up with me as an outpatient 2 weeks after discharge.    Deneisha Dade S 07/28/2014 1:39 PM

## 2014-07-28 NOTE — Progress Notes (Addendum)
Upon entering patient's room on changing shift, patient was very agitated about his pain medicine. Charge nurse was notified. Day shift nurse was instructed to evaluate patient from any behavioral changes. Charge nurse at bedside at this time.

## 2014-07-28 NOTE — Progress Notes (Signed)
[facility]                                                                              Patient Demographics  Philip [name], is a 64 y.o. male, DOB - 1951/03/05, FUX:[id number]  Admit date - 07/27/2014   Admitting Physician Erline Hau, MD  Outpatient Primary MD for the patient is [name], MD  LOS - 1   Chief Complaint  Patient presents with  . Back Pain      HPI on 07/27/2014 by Dr. Lelon Frohlich Patient is a 64 year old man well-known to me for recent hospitalization for the same complaints. He has a history of hypertension and diabetes. During prior hospitalization he had an MRI of his lumbar spine that showed an annular fissure and small right paracentral disc protrusion at L2-3 which may affect the right L3 nerve root in the lateral recess. There is also a shallow right foraminal disc protrusion at L4-5 possibly irritating the right L4 nerve root. There was also a left foraminal disc osteophyte complex with left foraminal narrowing which could affect the left L5 nerve root. Above findings were discussed via telephone with neurosurgeon, Dr. Saintclair Halsted, who recommended a dose of IV steroids followed by an oral prednisone taper and discharge from the hospital to follow-up with him in 2 days. Patient did not allow for [place] to make appointment for him. He was discharged. He returns to the emergency department today saying that ever since the IV pain medication that he received on day of discharge wore off he has been in intractable pain, again unable to get off the floor and has been crawling on the floor ever since. EDP requested admission for pain control. I requested that he contact neurosurgeon in [place] for recommendations on how to proceed. He relates that after speaking with Dr. Sherley Bounds he has recommended transfer to [facility] where he will see patient in consultation and consider possibility of a local anesthetic block for pain relief.  Assessment &  Plan  Sciatica/Lower back pain -Radiation of pain down the right leg -MRI on 07/23/2014: Annular fissure and small right paracentral disc protrusion L2-3, mass effect right L3 nerve root -Patient has received 2 courses of prednisone taper -Neurosurgery consulted and appreciated, Dr. Sherley Bounds called- no need for acute surgery at this point, IR for possible nerve block -Continue pain control -Of note, patient states morphine and OxyIR do not work for him.  Hypertension -Stable, Continue lisinopril  Diabetes mellitus, type II with neuropathy -Metformin and Invokana held -Continue insulin sliding scale CBG monitoring -Continue gabapentin for neuropathy  Depression anxiety -Continue Celexa  Code Status: Full  Family Communication: None at bedside  Disposition Plan: Admitted  Time Spent in minutes   30 minutes  Procedures  None  Consults   Neurosurgery, Dr. Sherley Bounds  DVT Prophylaxis  Heparin  Lab Results  Component Value Date   PLT 302 07/27/2014    Medications  Scheduled Meds: . citalopram  20 mg Oral Daily  . docusate sodium  100 mg Oral BID  . gabapentin  300 mg Oral TID  . heparin  5,000 Units Subcutaneous 3 times per day  .  insulin aspart  0-9 Units Subcutaneous TID WC  . insulin aspart  3 Units Subcutaneous TID WC  . lisinopril  20 mg Oral Daily   Continuous Infusions: . sodium chloride 75 mL/hr at 07/28/14 0247   PRN Meds:.acetaminophen **OR** acetaminophen, HYDROmorphone (DILAUDID) injection, morphine injection, ondansetron **OR** ondansetron (ZOFRAN) IV, oxyCODONE, senna-docusate  Antibiotics    Anti-infectives    None      Subjective:   Philip [name] seen and examined today.  Patient states his pain is currently 4 out of 10. Patient states his pain has been ongoing for weeks, has not seen a neurosurgeon or orthopedic doctor. States she has a burning fire-like pain that radiates from the "head and neck of his femur down the lateral aspect of his  right leg and to the sole of his right foot." He denies any loss of sensation. Patient states morphine and OxyIR do not help his pain however Dilaudid seems to dull the pain. Patient currently denies any chest pain, shortness of breath, dizziness, headache, abdominal pain.  Objective:   Filed Vitals:   07/27/14 1400 07/27/14 1541 07/27/14 2304 07/28/14 0651  BP: 144/82 149/73 139/74 133/63  Pulse: 83 84 93 83  Temp:  98.4 F (36.9 C) 99 F (37.2 C) 98.7 F (37.1 C)  TempSrc:  Oral Oral Oral  Resp:   18 16  Height:      Weight:      SpO2: 94% 98% 98% 97%    Wt Readings from Last 3 Encounters:  07/27/14 99.791 kg (220 lb)  07/22/14 100.699 kg (222 lb)  07/23/14 100.699 kg (222 lb)     Intake/Output Summary (Last 24 hours) at 07/28/14 1240 Last data filed at 07/28/14 0651  Gross per 24 hour  Intake      0 ml  Output    450 ml  Net   -450 ml    Exam  General: Well developed, well nourished, NAD, appears stated age  HEENT: NCAT, mucous membranes moist.   Cardiovascular: S1 S2 auscultated, no rubs, murmurs or gallops. Regular rate and rhythm.  Respiratory: Clear to auscultation bilaterally with equal chest rise  Abdomen: Soft, nontender, nondistended, + bowel sounds  Extremities: warm dry without cyanosis clubbing or edema  Neuro: AAOx3, cranial nerves grossly intact. Strength 5/5 in patient's upper and lower extremities bilaterally  Psych: Normal affect and demeanor with intact judgement and insight  Data Review   Micro Results No results found for this or any previous visit (from the past 240 hour(s)).  Radiology Reports Ct Head Wo Contrast  07/23/2014   CLINICAL DATA:  Right hip pain worsening today starting yesterday. Altered mental status. Flashback to [place]. No known injury.  EXAM: CT HEAD WITHOUT CONTRAST  TECHNIQUE: Contiguous axial images were obtained from the base of the skull through the vertex without intravenous contrast.  COMPARISON:  None.   FINDINGS: Ventricles and sulci appear symmetrical. No mass effect or midline shift. No abnormal extra-axial fluid collections. Gray-white matter junctions are distinct. Basal cisterns are not effaced. No evidence of acute intracranial hemorrhage. No depressed skull fractures. Mucosal thickening in the paranasal sinuses. Mastoid air cells are not opacified.  IMPRESSION: No acute intracranial abnormalities. Mucosal thickening in the paranasal sinuses.   Electronically Signed   By: Lucienne Capers M.D.   On: 07/23/2014 02:57   Mr Lumbar Spine Wo Contrast  07/23/2014   CLINICAL DATA:  Low back and left leg pain for 1 week. Previous history of lumbar surgery.  EXAM: MRI LUMBAR SPINE WITHOUT CONTRAST  TECHNIQUE: Multiplanar, multisequence MR imaging of the lumbar spine was performed. No intravenous contrast was administered.  COMPARISON:  None.  FINDINGS: Examination is limited due to motion degradation. Patient refused to finish the examination. No STIR sagittal images or postcontrast imaging was performed. Normal overall alignment of the lumbar vertebral bodies. They demonstrate normal marrow signal. Multilevel Schmorl's nodes are noted. The facets are normally aligned. No pars defects. No significant paraspinal or retroperitoneal findings. Mild bladder distention is noted.  Axial images are limited.  L1-2: Mild bulging annulus asymmetric right with minimal impression on the thecal sac. No spinal or foraminal stenosis.  L2-3: Bulging degenerated annulus and mild osteophytic ridging with mild bilateral lateral recess encroachment. There is also a annular fissure and a small right paracentral disc protrusion with caudal down turning. There is mild mass effect on the right side of the thecal sac and this could affect the right L3 nerve root in the lateral recess. No spinal stenosis. No foraminal stenosis.  L3-4: Diffuse bulging annulus, osteophytic ridging and facet disease contribute to mild bilateral lateral recess  stenosis. No significant spinal or foraminal stenosis.  L4-5: Mild bulging annulus and moderate facet disease with flattening of the ventral thecal sac and mild bilateral lateral recess encroachment. Shallow right foraminal disc protrusion appears to contact and slightly displace the right L4 nerve root.  L5-S1: Left-sided laminectomy defect noted. There is a diffuse bulging annulus but no spinal stenosis. There is a left foraminal disc osteophyte complex which narrows the neural foramen it may affect the left L5 nerve root.  IMPRESSION: 1. Degenerative lumbar spondylosis with multilevel disc disease and facet disease. 2. Limited examination by motion degradation. Patient also refused the postcontrast imaging. 3. Annular fissure and small right paracentral disc protrusion and L2-3. This may affect the right L3 nerve root in the lateral recess. 4. Shallow right foraminal disc protrusion at L4-5 possibly irritating the right L4 nerve root. 5. Postoperative changes at L5-S1. There is a left foraminal disc osteophyte complex with left foraminal narrowing which could affect the left L5 nerve root.   Electronically Signed   By: Marijo Sanes M.D.   On: 07/23/2014 13:38   Dg Hip Unilat With Pelvis 1v Right  07/22/2014   CLINICAL DATA:  Patient fell yesterday. Severe right hip pain. Altered mental status.  EXAM: RIGHT HIP (WITH PELVIS) 1 VIEW  COMPARISON:  None.  FINDINGS: Pelvis and right hip appear intact. No acute displaced fractures identified. SI joints and symphysis pubis are not displaced. No focal bone lesion or bone destruction. Bone cortex and trabecular architecture appear intact. Calcified phleboliths.  IMPRESSION: No acute bony abnormalities.   Electronically Signed   By: Lucienne Capers M.D.   On: 07/22/2014 22:47    CBC  Recent Labs Lab 07/22/14 2340 07/22/14 2345 07/27/14 0805 07/27/14 1647  WBC 13.4* 14.1* 17.2* 12.1*  HGB 16.1 16.3 16.6 15.0  HCT 46.9 48.3 48.1 45.0  PLT 274 305 309 302    MCV 90.0 92.2 90.8 90.0  MCH 30.9 31.1 31.3 30.0  MCHC 34.3 33.7 34.5 33.3  RDW 12.9 13.2 13.4 13.4  LYMPHSABS 4.9*  --  3.3  --   MONOABS 1.5*  --  1.9*  --   EOSABS 0.1  --  0.1  --   BASOSABS 0.0  --  0.0  --     Chemistries   Recent Labs Lab 07/22/14 2340 07/22/14 2345 07/27/14 0805 07/27/14 1647  NA 140  --  138  --   K 3.7  --  3.8  --   CL 107  --  107  --   CO2 24  --  22  --   GLUCOSE 128*  --  165*  --   BUN 18  --  19  --   CREATININE 0.91 0.92 0.86 0.82  CALCIUM 10.0  --  9.8  --   AST  --   --  28  --   ALT  --   --  40  --   ALKPHOS  --   --  77  --   BILITOT  --   --  0.7  --    ------------------------------------------------------------------------------------------------------------------ estimated creatinine clearance is 106.6 mL/min (by C-G formula based on Cr of 0.82). ------------------------------------------------------------------------------------------------------------------ No results for input(s): HGBA1C in the last 72 hours. ------------------------------------------------------------------------------------------------------------------ No results for input(s): CHOL, HDL, LDLCALC, TRIG, CHOLHDL, LDLDIRECT in the last 72 hours. ------------------------------------------------------------------------------------------------------------------ No results for input(s): TSH, T4TOTAL, T3FREE, THYROIDAB in the last 72 hours.  Invalid input(s): FREET3 ------------------------------------------------------------------------------------------------------------------ No results for input(s): VITAMINB12, FOLATE, FERRITIN, TIBC, IRON, RETICCTPCT in the last 72 hours.  Coagulation profile No results for input(s): INR, PROTIME in the last 168 hours.  No results for input(s): DDIMER in the last 72 hours.  Cardiac Enzymes No results for input(s): CKMB, TROPONINI, MYOGLOBIN in the last 168 hours.  Invalid input(s):  CK ------------------------------------------------------------------------------------------------------------------ Invalid input(s): POCBNP    Georgenia Salim D.O. on 07/28/2014 at 12:40 PM  Between 7am to 7pm - Pager - [phone]  After 7pm go to [email or web address] - password [email or web address]  And look for the night coverage person covering for me after hours  [facility] Office  [phone]

## 2014-07-28 NOTE — Progress Notes (Addendum)
Pt was upset of not getting his pain medicine on time. Explained that pain medication is given when needed and when requested by the patient. RN apologized to patient about not getting his pain medication. Pt verbalized "i'm sorry too".  Instructed pt to call this RN when he needs his pain medicine. Will continue to monitor.

## 2014-07-29 ENCOUNTER — Inpatient Hospital Stay (HOSPITAL_COMMUNITY): Payer: BLUE CROSS/BLUE SHIELD

## 2014-07-29 DIAGNOSIS — M5430 Sciatica, unspecified side: Secondary | ICD-10-CM | POA: Insufficient documentation

## 2014-07-29 DIAGNOSIS — E118 Type 2 diabetes mellitus with unspecified complications: Secondary | ICD-10-CM

## 2014-07-29 LAB — GLUCOSE, CAPILLARY
Glucose-Capillary: 152 mg/dL — ABNORMAL HIGH (ref 70–99)
Glucose-Capillary: 105 mg/dL — ABNORMAL HIGH (ref 70–99)
Glucose-Capillary: 156 mg/dL — ABNORMAL HIGH (ref 70–99)
Glucose-Capillary: 204 mg/dL — ABNORMAL HIGH (ref 70–99)
Glucose-Capillary: 150 mg/dL — ABNORMAL HIGH (ref 70–99)

## 2014-07-29 MED ORDER — METHYLPREDNISOLONE ACETATE 40 MG/ML IJ SUSP
INTRAMUSCULAR | Status: AC
  Administered 2014-07-29: 40 mg
  Filled 2014-07-29: qty 1

## 2014-07-29 MED ORDER — HYDROMORPHONE HCL 1 MG/ML IJ SOLN
1.0000 mg | INTRAMUSCULAR | Status: DC | PRN
Start: 2014-07-29 — End: 2014-08-01
  Administered 2014-07-29 – 2014-08-01 (×19): 1 mg via INTRAVENOUS
  Filled 2014-07-29 (×19): qty 1

## 2014-07-29 MED ORDER — IOHEXOL 180 MG/ML  SOLN
20.0000 mL | Freq: Once | INTRAMUSCULAR | Status: AC | PRN
  Administered 2014-07-29: 5 mL via INTRAVENOUS

## 2014-07-29 MED ORDER — METHOCARBAMOL 1000 MG/10ML IJ SOLN
500.0000 mg | Freq: Four times a day (QID) | INTRAVENOUS | Status: DC | PRN
  Administered 2014-07-29 – 2014-08-01 (×7): 500 mg via INTRAVENOUS
  Filled 2014-07-29 (×13): qty 5

## 2014-07-29 MED ORDER — METHYLPREDNISOLONE ACETATE 80 MG/ML IJ SUSP
INTRAMUSCULAR | Status: AC
  Filled 2014-07-29: qty 1

## 2014-07-29 MED ORDER — MORPHINE SULFATE 4 MG/ML IJ SOLN
INTRAMUSCULAR | Status: AC
Start: 2014-07-29 — End: 2014-07-29
  Filled 2014-07-29: qty 1

## 2014-07-29 MED ORDER — LIDOCAINE HCL (PF) 2 % IJ SOLN
INTRAMUSCULAR | Status: AC
  Filled 2014-07-29: qty 10

## 2014-07-29 MED ORDER — IOHEXOL 300 MG/ML  SOLN
10.0000 mL | Freq: Once | INTRAMUSCULAR | Status: AC | PRN
  Administered 2014-07-29: 10 mL via INTRAVENOUS

## 2014-07-29 NOTE — Consult Note (Signed)
Chief Complaint: Chief Complaint  Patient presents with  . Back Pain    Referring Physician(s): Dr Ronnald Ramp [facility]  History of Present Illness: Philip [name] is a 64 y.o. male   Pt with Hx R L2-3 discectomy and L L5-S1 discectomy 1996 Has had great result from same Doing well until just few weeks ago Denies injury- back pain just suddenly began and worsening x 2 weeks Pain starts in Rt buttock and travels to hip. Burning sensation to Rt knee and at ankle is like a spasm Steroid dosepak- no relief Pain meds- no relief MRI reveals R L4-5 disc herniation with compression at L4 NRB Pt has been examined by Dr Ronnald Ramp - Neuro surgery Requesting L 4-5 nerve root block Dr Maree Erie has reviewed imaging and approves procedure I have seen and examined pt  Past Medical History  Diagnosis Date  . Chest pain   . Diabetes mellitus without complication   . Hypertension   . Hyperlipidemia   . PTSD (post-traumatic stress disorder)     Past Surgical History  Procedure Laterality Date  . Tonsillectomy    . Back surgery  1999    hernitated and ruptured discs  . Tonsillectomy      Allergies: Ativan; Blueberry; Fish oil; Shellfish allergy; and Ultram  Medications: Prior to Admission medications   Medication Sig Start Date End Date Taking? Authorizing Provider  aspirin EC 81 MG tablet Take 81 mg by mouth daily.   Yes Historical Provider, MD  canagliflozin (INVOKANA) 100 MG TABS tablet Take 1 tablet (100 mg total) by mouth daily. 07/25/14  Yes Lezlie Octave Black, NP  citalopram (CELEXA) 20 MG tablet Take 1 tablet (20 mg total) by mouth daily. 07/25/14  Yes Lezlie Octave Black, NP  gabapentin (NEURONTIN) 300 MG capsule Take 1 capsule (300 mg total) by mouth 3 (three) times daily. 07/25/14  Yes Lezlie Octave Black, NP  Hydrocodone-Acetaminophen 5-300 MG TABS Take 1 tablet by mouth every 6 (six) hours as needed. 07/25/14  Yes Lezlie Octave Black, NP  lisinopril (PRINIVIL,ZESTRIL) 20 MG tablet Take 1 tablet (20 mg total) by  mouth daily. 07/25/14  Yes Radene Gunning, NP  metFORMIN (GLUCOPHAGE) 1000 MG tablet Take 1 tablet (1,000 mg total) by mouth 2 (two) times daily with a meal. 05/22/14  Yes [name], MD  nitroGLYCERIN (NITROSTAT) 0.4 MG SL tablet Place 0.4 mg under the tongue every 5 (five) minutes as needed for chest pain.    Yes Historical Provider, MD  predniSONE (DELTASONE) 10 MG tablet Take 5 tabs starting 07/26/14 then take 4 tabs for 1 day then take 3 tabs for 1 day then take 2 tabs for 1 day then take 1 tab for 1 day then stop 07/25/14  Yes Lezlie Octave Black, NP  amLODipine (NORVASC) 10 MG tablet Take 1 tablet (10 mg total) by mouth daily. Patient not taking: Reported on 07/27/2014 05/08/13   Vernie Shanks, MD  ezetimibe (ZETIA) 10 MG tablet Take 1 tablet (10 mg total) by mouth daily. Patient not taking: Reported on 07/27/2014 07/25/14   Radene Gunning, NP     Family History  Problem Relation Age of Onset  . Adopted: Yes    History   Social History  . Marital Status: Single    Spouse Name: N/A  . Number of Children: N/A  . Years of Education: N/A   Social History Main Topics  . Smoking status: Current Some Day Smoker -- 0.25 packs/day  . Smokeless  tobacco: Not on file  . Alcohol Use: No  . Drug Use: No  . Sexual Activity: Not on file   Other Topics Concern  . None   Social History Narrative     Review of Systems: A 12 point ROS discussed and pertinent positives are indicated in the HPI above.  All other systems are negative.  Review of Systems  Constitutional: Positive for activity change. Negative for fever.  Respiratory: Negative for shortness of breath.   Cardiovascular: Negative for leg swelling.  Musculoskeletal: Positive for back pain.  Neurological: Negative for weakness and numbness.  Psychiatric/Behavioral: Negative for behavioral problems and confusion.    Vital Signs: BP 154/83 mmHg  Pulse 83  Temp(Src) 98.9 F (37.2 C) (Oral)  Resp 16  Ht 5' 8.5" (1.74 m)  Wt 98.612 kg  (217 lb 6.4 oz)  BMI 32.57 kg/m2  SpO2 95%  Physical Exam  Constitutional: He appears well-nourished.  Cardiovascular: Normal rate and regular rhythm.   Pulmonary/Chest: Effort normal and breath sounds normal.  Abdominal: Soft.  Musculoskeletal: Normal range of motion. He exhibits tenderness.  Rt leg pain from buttock to ankle NT to palpate leg  Skin: Skin is warm and dry.  Psychiatric: He has a normal mood and affect. His behavior is normal. Judgment and thought content normal.  Nursing note and vitals reviewed.   Mallampati Score:  MD Evaluation Airway: WNL Heart: WNL Abdomen: WNL Chest/ Lungs: WNL ASA  Classification: 2 Mallampati/Airway Score: One  Imaging: Ct Head Wo Contrast  07/23/2014   CLINICAL DATA:  Right hip pain worsening today starting yesterday. Altered mental status. Flashback to [place]. No known injury.  EXAM: CT HEAD WITHOUT CONTRAST  TECHNIQUE: Contiguous axial images were obtained from the base of the skull through the vertex without intravenous contrast.  COMPARISON:  None.  FINDINGS: Ventricles and sulci appear symmetrical. No mass effect or midline shift. No abnormal extra-axial fluid collections. Gray-white matter junctions are distinct. Basal cisterns are not effaced. No evidence of acute intracranial hemorrhage. No depressed skull fractures. Mucosal thickening in the paranasal sinuses. Mastoid air cells are not opacified.  IMPRESSION: No acute intracranial abnormalities. Mucosal thickening in the paranasal sinuses.   Electronically Signed   By: Lucienne Capers M.D.   On: 07/23/2014 02:57   Mr Lumbar Spine Wo Contrast  07/23/2014   CLINICAL DATA:  Low back and left leg pain for 1 week. Previous history of lumbar surgery.  EXAM: MRI LUMBAR SPINE WITHOUT CONTRAST  TECHNIQUE: Multiplanar, multisequence MR imaging of the lumbar spine was performed. No intravenous contrast was administered.  COMPARISON:  None.  FINDINGS: Examination is limited due to motion  degradation. Patient refused to finish the examination. No STIR sagittal images or postcontrast imaging was performed. Normal overall alignment of the lumbar vertebral bodies. They demonstrate normal marrow signal. Multilevel Schmorl's nodes are noted. The facets are normally aligned. No pars defects. No significant paraspinal or retroperitoneal findings. Mild bladder distention is noted.  Axial images are limited.  L1-2: Mild bulging annulus asymmetric right with minimal impression on the thecal sac. No spinal or foraminal stenosis.  L2-3: Bulging degenerated annulus and mild osteophytic ridging with mild bilateral lateral recess encroachment. There is also a annular fissure and a small right paracentral disc protrusion with caudal down turning. There is mild mass effect on the right side of the thecal sac and this could affect the right L3 nerve root in the lateral recess. No spinal stenosis. No foraminal stenosis.  L3-4: Diffuse bulging  annulus, osteophytic ridging and facet disease contribute to mild bilateral lateral recess stenosis. No significant spinal or foraminal stenosis.  L4-5: Mild bulging annulus and moderate facet disease with flattening of the ventral thecal sac and mild bilateral lateral recess encroachment. Shallow right foraminal disc protrusion appears to contact and slightly displace the right L4 nerve root.  L5-S1: Left-sided laminectomy defect noted. There is a diffuse bulging annulus but no spinal stenosis. There is a left foraminal disc osteophyte complex which narrows the neural foramen it may affect the left L5 nerve root.  IMPRESSION: 1. Degenerative lumbar spondylosis with multilevel disc disease and facet disease. 2. Limited examination by motion degradation. Patient also refused the postcontrast imaging. 3. Annular fissure and small right paracentral disc protrusion and L2-3. This may affect the right L3 nerve root in the lateral recess. 4. Shallow right foraminal disc protrusion at  L4-5 possibly irritating the right L4 nerve root. 5. Postoperative changes at L5-S1. There is a left foraminal disc osteophyte complex with left foraminal narrowing which could affect the left L5 nerve root.   Electronically Signed   By: Marijo Sanes M.D.   On: 07/23/2014 13:38   Dg Hip Unilat With Pelvis 1v Right  07/22/2014   CLINICAL DATA:  Patient fell yesterday. Severe right hip pain. Altered mental status.  EXAM: RIGHT HIP (WITH PELVIS) 1 VIEW  COMPARISON:  None.  FINDINGS: Pelvis and right hip appear intact. No acute displaced fractures identified. SI joints and symphysis pubis are not displaced. No focal bone lesion or bone destruction. Bone cortex and trabecular architecture appear intact. Calcified phleboliths.  IMPRESSION: No acute bony abnormalities.   Electronically Signed   By: Lucienne Capers M.D.   On: 07/22/2014 22:47    Labs:  CBC:  Recent Labs  07/22/14 2340 07/22/14 2345 07/27/14 0805 07/27/14 1647  WBC 13.4* 14.1* 17.2* 12.1*  HGB 16.1 16.3 16.6 15.0  HCT 46.9 48.3 48.1 45.0  PLT 274 305 309 302    COAGS: No results for input(s): INR, APTT in the last 8760 hours.  BMP:  Recent Labs  08/20/13 0911 01/17/14 0852 07/22/14 2340 07/22/14 2345 07/27/14 0805 07/27/14 1647  NA 141 139 140  --  138  --   K 4.4 5.0 3.7  --  3.8  --   CL 102 101 107  --  107  --   CO2 23 23 24   --  22  --   GLUCOSE 121* 153* 128*  --  165*  --   BUN 16 15 18   --  19  --   CALCIUM 11.0* 10.7* 10.0  --  9.8  --   CREATININE 0.97 0.85 0.91 0.92 0.86 0.82  GFRNONAA 83 93 88* 88* >90 >90  GFRAA 96 108 >90 >90 >90 >90    LIVER FUNCTION TESTS:  Recent Labs  01/17/14 0852 07/27/14 0805  BILITOT 0.3 0.7  AST 17 28  ALT 18 40  ALKPHOS 97 77  PROT 6.5 6.7  ALBUMIN  --  4.0    TUMOR MARKERS: No results for input(s): AFPTM, CEA, CA199, CHROMGRNA in the last 8760 hours.  Assessment and Plan:  Severe back pain No relief with steroids or pain meds MRI shows R L4-5 disc  herniation compressing L L4 NR Now scheduled for L 4-5 NRB in IR Pt aware of procedure benefits and risk including but not limited to Infection; bleeding; damage to surrounding structures Agreeable to proceed Consent signed andin chart LD Hep inj 4/10  am  Thank you for this interesting consult.  I greatly enjoyed meeting Jermayne Sweeney and look forward to participating in their care.  Signed: Tristan Proto A 07/29/2014, 8:19 AM   I spent a total of 40 Minutes  in face to face in clinical consultation, greater than 50% of which was counseling/coordinating care for L 4-5 NRB

## 2014-07-29 NOTE — Procedures (Signed)
Pt in severe pain.  Difficult to position on table.  Skin scrubbed with Betadine.  Local anesthesia with 2% lidocaine.  22 gauge needle directed under flouro guidance to dorsal foramen on right at L4/5.  Attempted to inject omnipaque 180 to assure proper positioning.  I could not achieve a needle location that would allow delivery of injectate.  The patient was in too much pain.  He and I decided to forgo the injection at that point.

## 2014-07-29 NOTE — Progress Notes (Signed)
[facility]                                                                              Patient Demographics  Philip [name], is a 64 y.o. male, DOB - 1950-05-15, PFX:[id number]  Admit date - 07/27/2014   Admitting Physician Erline Hau, MD  Outpatient Primary MD for the patient is [name], MD  LOS - 2   Chief Complaint  Patient presents with  . Back Pain      HPI on 07/27/2014 by Dr. Lelon Frohlich Patient is a 64 year old man well-known to me for recent hospitalization for the same complaints. He has a history of hypertension and diabetes. During prior hospitalization he had an MRI of his lumbar spine that showed an annular fissure and small right paracentral disc protrusion at L2-3 which may affect the right L3 nerve root in the lateral recess. There is also a shallow right foraminal disc protrusion at L4-5 possibly irritating the right L4 nerve root. There was also a left foraminal disc osteophyte complex with left foraminal narrowing which could affect the left L5 nerve root. Above findings were discussed via telephone with neurosurgeon, Dr. Saintclair Halsted, who recommended a dose of IV steroids followed by an oral prednisone taper and discharge from the hospital to follow-up with him in 2 days. Patient did not allow for [place] to make appointment for him. He was discharged. He returns to the emergency department today saying that ever since the IV pain medication that he received on day of discharge wore off he has been in intractable pain, again unable to get off the floor and has been crawling on the floor ever since. EDP requested admission for pain control. I requested that he contact neurosurgeon in [place] for recommendations on how to proceed. He relates that after speaking with Dr. Sherley Bounds he has recommended transfer to [facility] where he will see patient in consultation and consider possibility of a local anesthetic block for pain relief.  Assessment &  Plan  Sciatica/Lower back pain -Radiation of pain down the right leg -MRI on 07/23/2014: Annular fissure and small right paracentral disc protrusion L2-3, mass effect right L3 nerve root -Patient has received 2 courses of prednisone taper -Neurosurgery consulted and appreciated, Dr. Sherley Bounds called- no need for acute surgery at this point, IR for possible nerve block -IR consulted and appreciated for nerve block -Continue pain control -Of note, patient states morphine and OxyIR do not work for him. -Patient was joking around this morning but stated he was in pain.  Also asked if he could go to a conference after the injection  Hypertension -Stable, Continue lisinopril  Diabetes mellitus, type II with neuropathy -Metformin and Invokana held -Continue insulin sliding scale CBG monitoring -Continue gabapentin for neuropathy  Depression anxiety -Continue Celexa  Code Status: Full  Family Communication: None at bedside  Disposition Plan: Admitted, pending nerve block.   Time Spent in minutes   30 minutes  Procedures  None  Consults   Neurosurgery, Dr. Sherley Bounds Interventional radiology  DVT Prophylaxis  Heparin  Lab Results  Component Value Date   PLT 302 07/27/2014    Medications  Scheduled Meds: . citalopram  20 mg Oral Daily  . docusate sodium  100 mg Oral BID  . gabapentin  300 mg Oral TID  . insulin aspart  0-9 Units Subcutaneous TID WC  . insulin aspart  3 Units Subcutaneous TID WC  . lidocaine      . lisinopril  20 mg Oral Daily  . methylPREDNISolone acetate      . morphine       Continuous Infusions: . sodium chloride 75 mL/hr at 07/28/14 0247   PRN Meds:.acetaminophen **OR** acetaminophen, HYDROmorphone (DILAUDID) injection, morphine injection, ondansetron **OR** ondansetron (ZOFRAN) IV, oxyCODONE, senna-docusate  Antibiotics    Anti-infectives    None      Subjective:   [name] seen and examined today.  Patient continues to complain  of pain. He also, as he has not had a shower, had his teeth brushed or his face washed. Patient denies any chest pain, shortness of breath, abdominal pain at this time. He still has been unable to have a bowel movement. Patient wonders if he can relieve after his nerve block is conducted.  Objective:   Filed Vitals:   07/28/14 1305 07/28/14 2002 07/28/14 2126 07/29/14 0503  BP: 134/72 124/70  154/83  Pulse: 94 86  83  Temp: 98.8 F (37.1 C) 98.8 F (37.1 C)  98.9 F (37.2 C)  TempSrc:  Oral  Oral  Resp: 16     Height:      Weight:   98.612 kg (217 lb 6.4 oz)   SpO2: 96% 98%  95%    Wt Readings from Last 3 Encounters:  07/28/14 98.612 kg (217 lb 6.4 oz)  07/22/14 100.699 kg (222 lb)  07/23/14 100.699 kg (222 lb)     Intake/Output Summary (Last 24 hours) at 07/29/14 1126 Last data filed at 07/29/14 0924  Gross per 24 hour  Intake   2205 ml  Output   3250 ml  Net  -1045 ml    Exam  General: Well developed, well nourished, NAD.   Cardiovascular: S1 S2 auscultated,RRR, no murmurs  Respiratory: Clear to auscultation  Abdomen: Soft, nontender, nondistended, + bowel sounds  Extremities: warm dry without cyanosis clubbing or edema  Neuro: AAOx3, nonfocal, intact sensation   Psych: Appropriate  Data Review   Micro Results No results found for this or any previous visit (from the past 240 hour(s)).  Radiology Reports Ct Head Wo Contrast  07/23/2014   CLINICAL DATA:  Right hip pain worsening today starting yesterday. Altered mental status. Flashback to [place]. No known injury.  EXAM: CT HEAD WITHOUT CONTRAST  TECHNIQUE: Contiguous axial images were obtained from the base of the skull through the vertex without intravenous contrast.  COMPARISON:  None.  FINDINGS: Ventricles and sulci appear symmetrical. No mass effect or midline shift. No abnormal extra-axial fluid collections. Gray-white matter junctions are distinct. Basal cisterns are not effaced. No evidence of acute  intracranial hemorrhage. No depressed skull fractures. Mucosal thickening in the paranasal sinuses. Mastoid air cells are not opacified.  IMPRESSION: No acute intracranial abnormalities. Mucosal thickening in the paranasal sinuses.   Electronically Signed   By: Lucienne Capers M.D.   On: 07/23/2014 02:57   Mr Lumbar Spine Wo Contrast  07/23/2014   CLINICAL DATA:  Low back and left leg pain for 1 week. Previous history of lumbar surgery.  EXAM: MRI LUMBAR SPINE WITHOUT CONTRAST  TECHNIQUE: Multiplanar, multisequence MR imaging of the lumbar spine was performed. No intravenous contrast was administered.  COMPARISON:  None.  FINDINGS: Examination is limited due to motion degradation. Patient refused to finish the examination. No STIR sagittal images or postcontrast imaging was performed. Normal overall alignment of the lumbar vertebral bodies. They demonstrate normal marrow signal. Multilevel Schmorl's nodes are noted. The facets are normally aligned. No pars defects. No significant paraspinal or retroperitoneal findings. Mild bladder distention is noted.  Axial images are limited.  L1-2: Mild bulging annulus asymmetric right with minimal impression on the thecal sac. No spinal or foraminal stenosis.  L2-3: Bulging degenerated annulus and mild osteophytic ridging with mild bilateral lateral recess encroachment. There is also a annular fissure and a small right paracentral disc protrusion with caudal down turning. There is mild mass effect on the right side of the thecal sac and this could affect the right L3 nerve root in the lateral recess. No spinal stenosis. No foraminal stenosis.  L3-4: Diffuse bulging annulus, osteophytic ridging and facet disease contribute to mild bilateral lateral recess stenosis. No significant spinal or foraminal stenosis.  L4-5: Mild bulging annulus and moderate facet disease with flattening of the ventral thecal sac and mild bilateral lateral recess encroachment. Shallow right foraminal  disc protrusion appears to contact and slightly displace the right L4 nerve root.  L5-S1: Left-sided laminectomy defect noted. There is a diffuse bulging annulus but no spinal stenosis. There is a left foraminal disc osteophyte complex which narrows the neural foramen it may affect the left L5 nerve root.  IMPRESSION: 1. Degenerative lumbar spondylosis with multilevel disc disease and facet disease. 2. Limited examination by motion degradation. Patient also refused the postcontrast imaging. 3. Annular fissure and small right paracentral disc protrusion and L2-3. This may affect the right L3 nerve root in the lateral recess. 4. Shallow right foraminal disc protrusion at L4-5 possibly irritating the right L4 nerve root. 5. Postoperative changes at L5-S1. There is a left foraminal disc osteophyte complex with left foraminal narrowing which could affect the left L5 nerve root.   Electronically Signed   By: Marijo Sanes M.D.   On: 07/23/2014 13:38   Ir Fluoro Guide Ndl Plmt / Bx  07/29/2014   CLINICAL DATA:  Spondylosis without myelopathy. Severe right low back, hip and leg pain going all way to the foot. Foraminal disc on the right at L4-5 by MR.  EXAM: EXAM Attempted right L4 NERVE ROOT BLOCK WITH TRANSFORAMINAL EPIDURAL STEROID INJECTION. Procedure unsuccessful.  TECHNIQUE: Overlying skin prepped with Betadine, draped in the usual sterile fashion. Curved 22 gauge spinal needle directed into the right L4-5 neural foramen. I attempted to inject a few drops of Omnipaque 180. I could not achieve a needle position that would allow injection of diagnostic amount of contrast and certainly would not allow administration of 3-4 cc of steroid and anesthetic. I tried several different positions and the procedure was simply far too painful for the patient. He and I decided to forego the injection at that point.  FLUOROSCOPY TIME:  1.7 min.  184.7 mGy.  IMPRESSION: Technically unsuccessful right L4selective nerve root block.  See above discussion.   Electronically Signed   By: Nelson Chimes M.D.   On: 07/29/2014 10:54   Dg Hip Unilat With Pelvis 1v Right  07/22/2014   CLINICAL DATA:  Patient fell yesterday. Severe right hip pain. Altered mental status.  EXAM: RIGHT HIP (WITH PELVIS) 1 VIEW  COMPARISON:  None.  FINDINGS: Pelvis and right hip appear intact. No acute displaced fractures identified. SI joints and symphysis pubis are not  displaced. No focal bone lesion or bone destruction. Bone cortex and trabecular architecture appear intact. Calcified phleboliths.  IMPRESSION: No acute bony abnormalities.   Electronically Signed   By: Lucienne Capers M.D.   On: 07/22/2014 22:47    CBC  Recent Labs Lab 07/22/14 2340 07/22/14 2345 07/27/14 0805 07/27/14 1647  WBC 13.4* 14.1* 17.2* 12.1*  HGB 16.1 16.3 16.6 15.0  HCT 46.9 48.3 48.1 45.0  PLT 274 305 309 302  MCV 90.0 92.2 90.8 90.0  MCH 30.9 31.1 31.3 30.0  MCHC 34.3 33.7 34.5 33.3  RDW 12.9 13.2 13.4 13.4  LYMPHSABS 4.9*  --  3.3  --   MONOABS 1.5*  --  1.9*  --   EOSABS 0.1  --  0.1  --   BASOSABS 0.0  --  0.0  --     Chemistries   Recent Labs Lab 07/22/14 2340 07/22/14 2345 07/27/14 0805 07/27/14 1647  NA 140  --  138  --   K 3.7  --  3.8  --   CL 107  --  107  --   CO2 24  --  22  --   GLUCOSE 128*  --  165*  --   BUN 18  --  19  --   CREATININE 0.91 0.92 0.86 0.82  CALCIUM 10.0  --  9.8  --   AST  --   --  28  --   ALT  --   --  40  --   ALKPHOS  --   --  77  --   BILITOT  --   --  0.7  --    ------------------------------------------------------------------------------------------------------------------ estimated creatinine clearance is 105.9 mL/min (by C-G formula based on Cr of 0.82). ------------------------------------------------------------------------------------------------------------------ No results for input(s): HGBA1C in the last 72  hours. ------------------------------------------------------------------------------------------------------------------ No results for input(s): CHOL, HDL, LDLCALC, TRIG, CHOLHDL, LDLDIRECT in the last 72 hours. ------------------------------------------------------------------------------------------------------------------ No results for input(s): TSH, T4TOTAL, T3FREE, THYROIDAB in the last 72 hours.  Invalid input(s): FREET3 ------------------------------------------------------------------------------------------------------------------ No results for input(s): VITAMINB12, FOLATE, FERRITIN, TIBC, IRON, RETICCTPCT in the last 72 hours.  Coagulation profile No results for input(s): INR, PROTIME in the last 168 hours.  No results for input(s): DDIMER in the last 72 hours.  Cardiac Enzymes No results for input(s): CKMB, TROPONINI, MYOGLOBIN in the last 168 hours.  Invalid input(s): CK ------------------------------------------------------------------------------------------------------------------ Invalid input(s): POCBNP    Michayla Mcneil D.O. on 07/29/2014 at 11:26 AM  Between 7am to 7pm - Pager - [phone]  After 7pm go to [email or web address] - password [email or web address]  And look for the night coverage person covering for me after hours  [facility] Office  [phone]

## 2014-07-29 NOTE — Progress Notes (Signed)
Patient ID: Philip [name], male   DOB: 1950/07/27, 64 y.o.   MRN: [id number]    R L4-5 NRB request received in IR  In review with Radiologist now Will call for pt today when can if deemed appropriate

## 2014-07-29 NOTE — Progress Notes (Signed)
Utilization review completed.  

## 2014-07-30 ENCOUNTER — Other Ambulatory Visit: Payer: Self-pay | Admitting: Neurological Surgery

## 2014-07-30 LAB — CBC
HCT: 45.2 % (ref 39.0–52.0)
Hemoglobin: 15.3 g/dL (ref 13.0–17.0)
MCH: 30.5 pg (ref 26.0–34.0)
MCHC: 33.8 g/dL (ref 30.0–36.0)
MCV: 90.2 fL (ref 78.0–100.0)
Platelets: 259 10*3/uL (ref 150–400)
RBC: 5.01 MIL/uL (ref 4.22–5.81)
RDW: 13.3 % (ref 11.5–15.5)
WBC: 13.3 10*3/uL — ABNORMAL HIGH (ref 4.0–10.5)

## 2014-07-30 LAB — GLUCOSE, CAPILLARY
Glucose-Capillary: 163 mg/dL — ABNORMAL HIGH (ref 70–99)
Glucose-Capillary: 176 mg/dL — ABNORMAL HIGH (ref 70–99)
Glucose-Capillary: 192 mg/dL — ABNORMAL HIGH (ref 70–99)
Glucose-Capillary: 172 mg/dL — ABNORMAL HIGH (ref 70–99)

## 2014-07-30 NOTE — Progress Notes (Signed)
Attempted to get pt up to [facility] but pt was in too much pain to sit up. Attempted 2xs with no success. Pt clearly looked in serious pain.

## 2014-07-30 NOTE — Progress Notes (Signed)
Pt seen and examined. Unfortunately he could not tolerate the attempt at the epidural steroid injection, it simply caused too much pain. He continues to complain of severe right leg pain. Physical therapy today did help somewhat. He is fairly debilitated and having trouble getting up and around.  I have recommended a right L4-5 extraforaminal microdiscectomy. We will do this on Thursday morning. I have described the surgery to him as best I can. We have talked about typical outcomes and recovery times. He understands the risk of the surgery include but are not limited to bleeding, infection, CSF leak, nerve root injury, numbness, weakness, instability, need for further surgery, lack of relief of pain, worsening pain, vascular injury, loss of bowel bladder or sexual function, and anesthesia risk including DVT pneumonia MI and death. He agrees to proceed.

## 2014-07-30 NOTE — Progress Notes (Addendum)
[facility]                                                                              Patient Demographics  Philip [name], is a 64 y.o. male, DOB - Aug 16, 1950, NWG:[id number]  Admit date - 07/27/2014   Admitting Physician Erline Hau, MD  Outpatient Primary MD for the patient is [name], MD  LOS - 3   Chief Complaint  Patient presents with  . Back Pain      HPI on 07/27/2014 by Dr. Lelon Frohlich Patient is a 64 year old man well-known to me for recent hospitalization for the same complaints. He has a history of hypertension and diabetes. During prior hospitalization he had an MRI of his lumbar spine that showed an annular fissure and small right paracentral disc protrusion at L2-3 which may affect the right L3 nerve root in the lateral recess. There is also a shallow right foraminal disc protrusion at L4-5 possibly irritating the right L4 nerve root. There was also a left foraminal disc osteophyte complex with left foraminal narrowing which could affect the left L5 nerve root. Above findings were discussed via telephone with neurosurgeon, Dr. Saintclair Halsted, who recommended a dose of IV steroids followed by an oral prednisone taper and discharge from the hospital to follow-up with him in 2 days. Patient did not allow for [place] to make appointment for him. He was discharged. He returns to the emergency department today saying that ever since the IV pain medication that he received on day of discharge wore off he has been in intractable pain, again unable to get off the floor and has been crawling on the floor ever since. EDP requested admission for pain control. I requested that he contact neurosurgeon in [place] for recommendations on how to proceed. He relates that after speaking with Dr. Sherley Bounds he has recommended transfer to [facility] where he will see patient in consultation and consider possibility of a local anesthetic block for pain relief.  Interim  history Patient admitted for back pain.  Neurosugery consulted and recommended nerve block by IR, however, procedure unable to be completed due to inflammation and patient pain. Pending further recommendations from neurosurgery. Patient continues to need IV pain medications. Physical therapy consulted and pending.  Assessment & Plan  Sciatica/Lower back pain -Radiation of pain down the right leg -MRI on 07/23/2014: Annular fissure and small right paracentral disc protrusion L2-3, mass effect right L3 nerve root -Patient has received 2 courses of prednisone taper -Neurosurgery consulted and appreciated, Dr. Sherley Bounds called- no need for acute surgery at this point, IR for possible nerve block; pending further recommendations from neurosurgery as nerve block not completed -IR consulted and appreciated for nerve block- however not completed due to positioning/location and patient's pain -Continue pain control, will need to be weaned off of IV pain control -Of note, patient states morphine and OxyIR do not work for him.  -Spoke with Dr. Ronnald Ramp, plan for possible discectomy this week.  Hypertension -Stable, Continue lisinopril  Diabetes mellitus, type II with neuropathy -Metformin and Invokana held -Continue insulin sliding scale CBG monitoring -Continue gabapentin for neuropathy  Depression anxiety -Continue Celexa  Leukocytosis -  Unlikely due to infection; likely reactive -Patient denies shortness of breath, cough; UA negative for infection -Continue to monitor CBC  Code Status: Full  Family Communication: None at bedside  Disposition Plan: Admitted, Patient continues to need IV pain control.  Time Spent in minutes   30 minutes  Procedures  None  Consults   Neurosurgery, Dr. Sherley Bounds Interventional radiology  DVT Prophylaxis  Heparin  Lab Results  Component Value Date   PLT 259 07/30/2014    Medications  Scheduled Meds: . citalopram  20 mg Oral Daily  . docusate  sodium  100 mg Oral BID  . gabapentin  300 mg Oral TID  . insulin aspart  0-9 Units Subcutaneous TID WC  . insulin aspart  3 Units Subcutaneous TID WC  . lisinopril  20 mg Oral Daily   Continuous Infusions: . sodium chloride 75 mL/hr at 07/30/14 0639   PRN Meds:.acetaminophen **OR** acetaminophen, HYDROmorphone (DILAUDID) injection, methocarbamol (ROBAXIN)  IV, morphine injection, ondansetron **OR** ondansetron (ZOFRAN) IV, oxyCODONE, senna-docusate  Antibiotics    Anti-infectives    None      Subjective:   Philip [name] seen and examined today.  Patient continues to complain of pain and the inability to sit up. Feels very sore.  States he was finally able to rest of a small period of time.  He still has not had a bowel movement. Currently denies chest pain, shortness of breath, abdominal pain, dizziness, headache.   Objective:   Filed Vitals:   07/29/14 0503 07/29/14 1258 07/29/14 2009 07/30/14 0517  BP: 154/83 149/77 136/72 145/83  Pulse: 83 82 82 86  Temp: 98.9 F (37.2 C) 98.7 F (37.1 C) 99.5 F (37.5 C) 98.6 F (37 C)  TempSrc: Oral  Oral   Resp:  16 16 18   Height:      Weight:      SpO2: 95% 96% 95% 95%    Wt Readings from Last 3 Encounters:  07/28/14 98.612 kg (217 lb 6.4 oz)  07/22/14 100.699 kg (222 lb)  07/23/14 100.699 kg (222 lb)     Intake/Output Summary (Last 24 hours) at 07/30/14 1036 Last data filed at 07/30/14 0845  Gross per 24 hour  Intake 3093.75 ml  Output   3550 ml  Net -456.25 ml    Exam  General: Well developed, well nourished, NAD.   Cardiovascular: S1 S2 auscultated, RRR, no murmurs  Respiratory: Clear to auscultation  Abdomen: Soft, nontender, nondistended, + bowel sounds  Extremities: warm dry without cyanosis clubbing or edema  Neuro: AAOx3, nonfocal, intact sensation and strength  Psych: Appropriate mood and affect  Data Review   Micro Results No results found for this or any previous visit (from the past 240  hour(s)).  Radiology Reports Ct Head Wo Contrast  07/23/2014   CLINICAL DATA:  Right hip pain worsening today starting yesterday. Altered mental status. Flashback to [place]. No known injury.  EXAM: CT HEAD WITHOUT CONTRAST  TECHNIQUE: Contiguous axial images were obtained from the base of the skull through the vertex without intravenous contrast.  COMPARISON:  None.  FINDINGS: Ventricles and sulci appear symmetrical. No mass effect or midline shift. No abnormal extra-axial fluid collections. Gray-white matter junctions are distinct. Basal cisterns are not effaced. No evidence of acute intracranial hemorrhage. No depressed skull fractures. Mucosal thickening in the paranasal sinuses. Mastoid air cells are not opacified.  IMPRESSION: No acute intracranial abnormalities. Mucosal thickening in the paranasal sinuses.   Electronically Signed   By: Gwyndolyn Saxon  Gerilyn Nestle M.D.   On: 07/23/2014 02:57   Mr Lumbar Spine Wo Contrast  07/23/2014   CLINICAL DATA:  Low back and left leg pain for 1 week. Previous history of lumbar surgery.  EXAM: MRI LUMBAR SPINE WITHOUT CONTRAST  TECHNIQUE: Multiplanar, multisequence MR imaging of the lumbar spine was performed. No intravenous contrast was administered.  COMPARISON:  None.  FINDINGS: Examination is limited due to motion degradation. Patient refused to finish the examination. No STIR sagittal images or postcontrast imaging was performed. Normal overall alignment of the lumbar vertebral bodies. They demonstrate normal marrow signal. Multilevel Schmorl's nodes are noted. The facets are normally aligned. No pars defects. No significant paraspinal or retroperitoneal findings. Mild bladder distention is noted.  Axial images are limited.  L1-2: Mild bulging annulus asymmetric right with minimal impression on the thecal sac. No spinal or foraminal stenosis.  L2-3: Bulging degenerated annulus and mild osteophytic ridging with mild bilateral lateral recess encroachment. There is also a  annular fissure and a small right paracentral disc protrusion with caudal down turning. There is mild mass effect on the right side of the thecal sac and this could affect the right L3 nerve root in the lateral recess. No spinal stenosis. No foraminal stenosis.  L3-4: Diffuse bulging annulus, osteophytic ridging and facet disease contribute to mild bilateral lateral recess stenosis. No significant spinal or foraminal stenosis.  L4-5: Mild bulging annulus and moderate facet disease with flattening of the ventral thecal sac and mild bilateral lateral recess encroachment. Shallow right foraminal disc protrusion appears to contact and slightly displace the right L4 nerve root.  L5-S1: Left-sided laminectomy defect noted. There is a diffuse bulging annulus but no spinal stenosis. There is a left foraminal disc osteophyte complex which narrows the neural foramen it may affect the left L5 nerve root.  IMPRESSION: 1. Degenerative lumbar spondylosis with multilevel disc disease and facet disease. 2. Limited examination by motion degradation. Patient also refused the postcontrast imaging. 3. Annular fissure and small right paracentral disc protrusion and L2-3. This may affect the right L3 nerve root in the lateral recess. 4. Shallow right foraminal disc protrusion at L4-5 possibly irritating the right L4 nerve root. 5. Postoperative changes at L5-S1. There is a left foraminal disc osteophyte complex with left foraminal narrowing which could affect the left L5 nerve root.   Electronically Signed   By: Marijo Sanes M.D.   On: 07/23/2014 13:38   Ir Fluoro Guide Ndl Plmt / Bx  07/29/2014   CLINICAL DATA:  Spondylosis without myelopathy. Severe right low back, hip and leg pain going all way to the foot. Foraminal disc on the right at L4-5 by MR.  EXAM: EXAM Attempted right L4 NERVE ROOT BLOCK WITH TRANSFORAMINAL EPIDURAL STEROID INJECTION. Procedure unsuccessful.  TECHNIQUE: Overlying skin prepped with Betadine, draped in the  usual sterile fashion. Curved 22 gauge spinal needle directed into the right L4-5 neural foramen. I attempted to inject a few drops of Omnipaque 180. I could not achieve a needle position that would allow injection of diagnostic amount of contrast and certainly would not allow administration of 3-4 cc of steroid and anesthetic. I tried several different positions and the procedure was simply far too painful for the patient. He and I decided to forego the injection at that point.  FLUOROSCOPY TIME:  1.7 min.  184.7 mGy.  IMPRESSION: Technically unsuccessful right L4selective nerve root block. See above discussion.   Electronically Signed   By: Nelson Chimes M.D.   On:  07/29/2014 10:54   Dg Hip Unilat With Pelvis 1v Right  07/22/2014   CLINICAL DATA:  Patient fell yesterday. Severe right hip pain. Altered mental status.  EXAM: RIGHT HIP (WITH PELVIS) 1 VIEW  COMPARISON:  None.  FINDINGS: Pelvis and right hip appear intact. No acute displaced fractures identified. SI joints and symphysis pubis are not displaced. No focal bone lesion or bone destruction. Bone cortex and trabecular architecture appear intact. Calcified phleboliths.  IMPRESSION: No acute bony abnormalities.   Electronically Signed   By: Lucienne Capers M.D.   On: 07/22/2014 22:47    CBC  Recent Labs Lab 07/27/14 0805 07/27/14 1647 07/30/14 0534  WBC 17.2* 12.1* 13.3*  HGB 16.6 15.0 15.3  HCT 48.1 45.0 45.2  PLT 309 302 259  MCV 90.8 90.0 90.2  MCH 31.3 30.0 30.5  MCHC 34.5 33.3 33.8  RDW 13.4 13.4 13.3  LYMPHSABS 3.3  --   --   MONOABS 1.9*  --   --   EOSABS 0.1  --   --   BASOSABS 0.0  --   --     Chemistries   Recent Labs Lab 07/27/14 0805 07/27/14 1647  NA 138  --   K 3.8  --   CL 107  --   CO2 22  --   GLUCOSE 165*  --   BUN 19  --   CREATININE 0.86 0.82  CALCIUM 9.8  --   AST 28  --   ALT 40  --   ALKPHOS 77  --   BILITOT 0.7  --     ------------------------------------------------------------------------------------------------------------------ estimated creatinine clearance is 105.9 mL/min (by C-G formula based on Cr of 0.82). ------------------------------------------------------------------------------------------------------------------ No results for input(s): HGBA1C in the last 72 hours. ------------------------------------------------------------------------------------------------------------------ No results for input(s): CHOL, HDL, LDLCALC, TRIG, CHOLHDL, LDLDIRECT in the last 72 hours. ------------------------------------------------------------------------------------------------------------------ No results for input(s): TSH, T4TOTAL, T3FREE, THYROIDAB in the last 72 hours.  Invalid input(s): FREET3 ------------------------------------------------------------------------------------------------------------------ No results for input(s): VITAMINB12, FOLATE, FERRITIN, TIBC, IRON, RETICCTPCT in the last 72 hours.  Coagulation profile No results for input(s): INR, PROTIME in the last 168 hours.  No results for input(s): DDIMER in the last 72 hours.  Cardiac Enzymes No results for input(s): CKMB, TROPONINI, MYOGLOBIN in the last 168 hours.  Invalid input(s): CK ------------------------------------------------------------------------------------------------------------------ Invalid input(s): POCBNP    Philip [name] D.O. on 07/30/2014 at 10:36 AM  Between 7am to 7pm - Pager - [phone]  After 7pm go to [email or web address] - password [email or web address]  And look for the night coverage person covering for me after hours  [facility] Office  [phone]

## 2014-07-30 NOTE — Evaluation (Signed)
Physical Therapy Evaluation Patient Details Name: Philip [name] MRN: [id number] DOB: 29-Nov-1950 Today's Date: 07/30/2014   History of Present Illness  Patient is a 64 y/o male admitted with intractable back pain.  History of remote laminectomies, now with Right paracentral disc protrusion L2-3; R foraminal protrusion L4-5 with nerve root compression and left foraminal disc osteophyte complex with left foraminal narrowing which could affect the left L5 nerve root  Clinical Impression  Patient presents with limited mobility due to pain, intolerance to sitting position and right LE pain/numbness with attempts at ambulation.  He doesn't have help at home, but does not want to go to SNF.  States he will get friends in to help him.  Agrees to HHPT.  Has walker already and home not conducive to [facility].  Will continue skilled PT in the acute setting to assist with mobility prior to d/c home.  Encouraged pt to continue attempts at mobility with nursing assist.    Follow Up Recommendations Home health PT;Supervision - Intermittent    Equipment Recommendations  None recommended by PT    Recommendations for Other Services       Precautions / Restrictions Precautions Precautions: Fall      Mobility  Bed Mobility Overal bed mobility: Needs Assistance Bed Mobility: Rolling;Sidelying to Sit;Sit to Supine Rolling: Supervision Sidelying to sit: Supervision   Sit to supine: Mod assist   General bed mobility comments: cues for rolling to side and side to sit technique for back protection; pt performed with heavy use of bedrail; then to supine pt with increased pain and had to help lift both legs into bed  Transfers Overall transfer level: Needs assistance Equipment used: Rolling walker (2 wheeled) Transfers: Sit to/from Stand Sit to Stand: Min guard         General transfer comment: assist for safety, cues for technique  Ambulation/Gait Ambulation/Gait assistance: Min  guard;Supervision Ambulation Distance (Feet): 12 Feet Assistive device: Rolling walker (2 wheeled) Gait Pattern/deviations: Antalgic;Decreased step length - left;Decreased step length - right     General Gait Details: very slow with severe pain in right leg with attempts at ambulation, c/o right LE numbness, able to walk short distance limited due to pain, minimal weight on right LE.  Stairs            Wheelchair Mobility    Modified Rankin (Stroke Patients Only)       Balance Overall balance assessment: Needs assistance Sitting-balance support: Bilateral upper extremity supported Sitting balance-Leahy Scale: Poor Sitting balance - Comments: needs UE support due to pain when sitting on right hip     Standing balance-Leahy Scale: Poor Standing balance comment: UE support needed due to pain with right LE weight bearing                             Pertinent Vitals/Pain Pain Assessment: Faces Pain Score: 9  Pain Location: right lateral hip/ leg down to ankle Pain Descriptors / Indicators: Burning;Aching Pain Intervention(s): Limited activity within patient's tolerance;Monitored during session;Relaxation;Repositioned;Heat applied;Other (comment) (gentle STW and MFR right lower back)    Home Living Family/patient expects to be discharged to:: Private residence Living Arrangements: Alone Available Help at Discharge: Friend(s);Available PRN/intermittently Type of Home: Mobile home Home Access: Stairs to enter Entrance Stairs-Rails: Right Entrance Stairs-Number of Steps: 12 Home Layout: One level Home Equipment: Walker - 2 wheels Additional Comments: States sleeps on pallet on the floor due to unable to find bed that  supports well enough.  Has been crawling around about 2 weeks due to pain; reluctant to ask friends for help, but will get more help as opposed to going to SNF    Prior Function Level of Independence: Independent               Hand  Dominance        Extremity/Trunk Assessment               Lower Extremity Assessment: RLE deficits/detail RLE Deficits / Details: Limited hip flexion in supine due to pain; strength at least 3/5, did not formally test due to pain; numbness and coldness right foot       Communication   Communication: No difficulties  Cognition Arousal/Alertness: Awake/alert Behavior During Therapy: WFL for tasks assessed/performed Overall Cognitive Status: Within Functional Limits for tasks assessed                      General Comments General comments (skin integrity, edema, etc.): noted right QL in spasm; performed gentle deep pressure for trigger pt release and gentle myofascial release; pt in semi left Sidelying; then heat packs to right low back and hip in supine    Exercises General Exercises - Lower Extremity Ankle Circles/Pumps: AROM;Both;5 reps;Supine Heel Slides: AAROM;AROM;Both;5 reps;Supine      Assessment/Plan    PT Assessment Patient needs continued PT services  PT Diagnosis Difficulty walking;Acute pain   PT Problem List Decreased strength;Pain;Decreased mobility;Decreased balance;Decreased activity tolerance  PT Treatment Interventions DME instruction;Balance training;Gait training;Stair training;Functional mobility training;Patient/family education;Therapeutic activities;Therapeutic exercise;Manual techniques;Modalities   PT Goals (Current goals can be found in the Care Plan section) Acute Rehab PT Goals Patient Stated Goal: To improve pain PT Goal Formulation: With patient Time For Goal Achievement: 08/06/14 Potential to Achieve Goals: Good    Frequency Min 3X/week   Barriers to discharge Decreased caregiver support      Co-evaluation               End of Session Equipment Utilized During Treatment: Gait belt Activity Tolerance: Patient limited by pain Patient left: in bed;with call bell/phone within reach           Time: 0947-1022 PT  Time Calculation (min) (ACUTE ONLY): 35 min   Charges:   PT Evaluation $Initial PT Evaluation Tier I: 1 Procedure PT Treatments $Gait Training: 8-22 mins   PT G Codes:        Kathye Cipriani,CYNDI 08-22-2014, 12:23 PM  Magda Kiel, [place] 22-Aug-2014

## 2014-07-31 ENCOUNTER — Inpatient Hospital Stay (HOSPITAL_COMMUNITY): Payer: BLUE CROSS/BLUE SHIELD

## 2014-07-31 LAB — URINE MICROSCOPIC-ADD ON

## 2014-07-31 LAB — BASIC METABOLIC PANEL
Anion gap: 10 (ref 5–15)
BUN: 8 mg/dL (ref 6–23)
CO2: 23 mmol/L (ref 19–32)
Calcium: 9.5 mg/dL (ref 8.4–10.5)
Chloride: 102 mmol/L (ref 96–112)
Creatinine, Ser: 0.7 mg/dL (ref 0.50–1.35)
GFR calc Af Amer: >90 mL/min (ref >90)
GFR calc non Af Amer: >90 mL/min (ref >90)
Glucose, Bld: 188 mg/dL — ABNORMAL HIGH (ref 70–99)
Potassium: 4.1 mmol/L (ref 3.5–5.1)
Sodium: 135 mmol/L (ref 135–145)

## 2014-07-31 LAB — GLUCOSE, CAPILLARY
Glucose-Capillary: 134 mg/dL — ABNORMAL HIGH (ref 70–99)
Glucose-Capillary: 168 mg/dL — ABNORMAL HIGH (ref 70–99)
Glucose-Capillary: 179 mg/dL — ABNORMAL HIGH (ref 70–99)
Glucose-Capillary: 144 mg/dL — ABNORMAL HIGH (ref 70–99)

## 2014-07-31 LAB — URINALYSIS, ROUTINE W REFLEX MICROSCOPIC
Bilirubin Urine: NEGATIVE
Glucose, UA: >1000 mg/dL — AB
Hgb urine dipstick: NEGATIVE
Ketones, ur: NEGATIVE mg/dL
Leukocytes, UA: NEGATIVE
Nitrite: NEGATIVE
Protein, ur: NEGATIVE mg/dL
Specific Gravity, Urine: 1.02 (ref 1.005–1.030)
Urobilinogen, UA: 4 mg/dL — ABNORMAL HIGH (ref 0.0–1.0)
pH: 6.5 (ref 5.0–8.0)

## 2014-07-31 LAB — CBC
HCT: 44.8 % (ref 39.0–52.0)
Hemoglobin: 14.9 g/dL (ref 13.0–17.0)
MCH: 30.5 pg (ref 26.0–34.0)
MCHC: 33.3 g/dL (ref 30.0–36.0)
MCV: 91.6 fL (ref 78.0–100.0)
Platelets: 255 10*3/uL (ref 150–400)
RBC: 4.89 MIL/uL (ref 4.22–5.81)
RDW: 13.5 % (ref 11.5–15.5)
WBC: 13.1 10*3/uL — ABNORMAL HIGH (ref 4.0–10.5)

## 2014-07-31 LAB — PROTIME-INR
INR: 1.03 (ref 0.00–1.49)
Prothrombin Time: 13.6 seconds (ref 11.6–15.2)

## 2014-07-31 MED ORDER — DOCUSATE SODIUM 100 MG PO CAPS
200.0000 mg | ORAL_CAPSULE | Freq: Two times a day (BID) | ORAL | Status: DC
  Filled 2014-07-31 (×2): qty 2

## 2014-07-31 MED ORDER — BISACODYL 5 MG PO TBEC: 10.0000 mg | DELAYED_RELEASE_TABLET | Freq: Every day | ORAL | Status: DC

## 2014-07-31 MED ORDER — DEXAMETHASONE SODIUM PHOSPHATE 10 MG/ML IJ SOLN: 10.0000 mg | INTRAMUSCULAR | Status: DC

## 2014-07-31 MED ORDER — METOPROLOL TARTRATE 1 MG/ML IV SOLN
5.0000 mg | INTRAVENOUS | Status: DC | PRN
Start: 2014-07-31 — End: 2014-08-02
  Filled 2014-07-31: qty 5

## 2014-07-31 MED ORDER — CEFAZOLIN SODIUM-DEXTROSE 2-3 GM-% IV SOLR
2.0000 g | INTRAVENOUS | Status: AC
  Administered 2014-08-01: 2 g via INTRAVENOUS
  Filled 2014-07-31: qty 50

## 2014-07-31 MED ORDER — LACTULOSE 10 GM/15ML PO SOLN
30.0000 g | Freq: Two times a day (BID) | ORAL | Status: AC
  Administered 2014-08-01: 30 g via ORAL
  Filled 2014-07-31: qty 60
  Filled 2014-07-31: qty 45

## 2014-07-31 MED ORDER — POLYETHYLENE GLYCOL 3350 17 G PO PACK
17.0000 g | PACK | Freq: Two times a day (BID) | ORAL | Status: DC
  Administered 2014-08-01: 17 g via ORAL
  Filled 2014-07-31 (×3): qty 1

## 2014-07-31 MED ORDER — SODIUM CHLORIDE 0.9 % IV SOLN
INTRAVENOUS | Status: DC
  Administered 2014-07-31: 23:00:00 via INTRAVENOUS

## 2014-07-31 NOTE — Progress Notes (Signed)
[facility]                                                                              Patient Demographics  Philip [name], is a 64 y.o. male, DOB - [birth year], ATF:[id number]  Admit date - 07/27/2014   Admitting Physician Erline Hau, MD  Outpatient Primary MD for the patient is [name], MD  LOS - 4   Chief Complaint  Patient presents with  . Back Pain      HPI on 07/27/2014 by Dr. Lelon Frohlich Patient is a 64 year old man well-known to me for recent hospitalization for the same complaints. He has a history of hypertension and diabetes. During prior hospitalization he had an MRI of his lumbar spine that showed an annular fissure and small right paracentral disc protrusion at L2-3 which may affect the right L3 nerve root in the lateral recess. There is also a shallow right foraminal disc protrusion at L4-5 possibly irritating the right L4 nerve root. There was also a left foraminal disc osteophyte complex with left foraminal narrowing which could affect the left L5 nerve root. Above findings were discussed via telephone with neurosurgeon, Dr. Saintclair Halsted, who recommended a dose of IV steroids followed by an oral prednisone taper and discharge from the hospital to follow-up with him in 2 days. Patient did not allow for [place] to make appointment for him. He was discharged. He returns to the emergency department today saying that ever since the IV pain medication that he received on day of discharge wore off he has been in intractable pain, again unable to get off the floor and has been crawling on the floor ever since. EDP requested admission for pain control. I requested that he contact neurosurgeon in [place] for recommendations on how to proceed. He relates that after speaking with Dr. Sherley Bounds he has recommended transfer to [facility] where he will see patient in consultation and consider possibility of a local anesthetic block for pain relief.  Interim  history Patient admitted for back pain.  Neurosugery consulted and recommended nerve block by IR, however, procedure unable to be completed due to inflammation and patient pain. Pending further recommendations from neurosurgery. Patient continues to need IV pain medications. Physical therapy consulted and all going. Currently neurosurgery planning to operate on his back on 08/01/2014.   Assessment & Plan   Sciatica/Lower back pain -Radiation of pain down the right leg -MRI on 07/23/2014: Annular fissure and small right paracentral disc protrusion L2-3, mass effect right L3 nerve root -Patient has received 2 courses of prednisone taper, IR failed to provide nerve block due to location of patient's pain and nerve impingement. -Neurosurgery consulted and appreciated, Dr. Sherley Bounds and planning to operate on 08/01/2014.  - Continue supportive care.  He should be mild-to-moderate risk for adverse cardio pulmonary outcome during the perioperative period due to his underlying hypertension, diabetes mellitus, multiple-pack-year ongoing smoking. Will get baseline EKG. No murmurs. He has good exercise capacity prior to his recent hospital admission. We will order as needed IV Lopressor for heart rate and blood pressure control. Risks and benefits of surgery told to the patient he is agreeable to proceed  with the surgical procedure.    Hypertension -Stable, Continue lisinopril    Diabetes mellitus, type II with neuropathy -Metformin and Invokana held -Continue insulin sliding scale CBG monitoring -Continue gabapentin for neuropathy  Lab Results  Component Value Date   HGBA1C 8.0* 07/24/2014    CBG (last 3)   Recent Labs  07/30/14 2155 07/31/14 0629 07/31/14 1151  GLUCAP 172* 134* 168*       Depression anxiety -Continue Celexa   Leukocytosis  Has been afebrile, check chest x-ray and UA. Monitor    Code Status: Full  Family Communication: None at bedside  Disposition  Plan: Admitted, Patient continues to need IV pain control.  Time Spent in minutes   30 minutes   Procedures  None   Consults   Neurosurgery, Dr. Sherley Bounds Interventional radiology   DVT Prophylaxis  SCD  Lab Results  Component Value Date   PLT 255 07/31/2014    Medications  Scheduled Meds: . bisacodyl  10 mg Oral Daily  . citalopram  20 mg Oral Daily  . docusate sodium  200 mg Oral BID  . gabapentin  300 mg Oral TID  . insulin aspart  0-9 Units Subcutaneous TID WC  . insulin aspart  3 Units Subcutaneous TID WC  . lactulose  30 g Oral BID  . lisinopril  20 mg Oral Daily  . polyethylene glycol  17 g Oral BID   Continuous Infusions: . sodium chloride 75 mL/hr at 07/31/14 0700   PRN Meds:.acetaminophen **OR** acetaminophen, HYDROmorphone (DILAUDID) injection, methocarbamol (ROBAXIN)  IV, morphine injection, ondansetron **OR** ondansetron (ZOFRAN) IV, oxyCODONE  Antibiotics    Anti-infectives    None      Subjective:   Philip [name] seen and examined today.  He is in bed, denies any headache chest or abdominal pain. Does have back pain radiating to his right leg. No weakness.  Objective:   Filed Vitals:   07/30/14 0517 07/30/14 1456 07/30/14 2035 07/31/14 0452  BP: 145/83 135/72 133/75 124/75  Pulse: 86 90 88 82  Temp: 98.6 F (37 C) 98.9 F (37.2 C) 99.9 F (37.7 C) 98.8 F (37.1 C)  TempSrc:  Oral Oral Oral  Resp: 18 18 17 18   Height:      Weight:      SpO2: 95% 93% 97% 95%    Wt Readings from Last 3 Encounters:  07/28/14 98.612 kg (217 lb 6.4 oz)  07/22/14 100.699 kg (222 lb)  07/23/14 100.699 kg (222 lb)     Intake/Output Summary (Last 24 hours) at 07/31/14 1154 Last data filed at 07/31/14 0700  Gross per 24 hour  Intake 2121.25 ml  Output   2600 ml  Net -478.75 ml    Exam  General: Well developed, well nourished, NAD.   Cardiovascular: S1 S2 auscultated, RRR, no murmurs  Respiratory: Clear to auscultation  Abdomen: Soft,  nontender, nondistended, + bowel sounds  Extremities: warm dry without cyanosis clubbing or edema  Neuro: AAOx3, nonfocal, intact sensation and strength  Psych: Appropriate mood and affect  Data Review   Micro Results Recent Results (from the past 240 hour(s))  Culture, blood (routine x 2)     Status: None (Preliminary result)   Collection Time: 07/30/14 12:25 PM  Result Value Ref Range Status   Specimen Description BLOOD RIGHT ANTECUBITAL  Final   Special Requests BOTTLES DRAWN AEROBIC AND ANAEROBIC 10CC  Final   Culture   Final           BLOOD  CULTURE RECEIVED NO GROWTH TO DATE CULTURE WILL BE HELD FOR 5 DAYS BEFORE ISSUING A FINAL NEGATIVE REPORT Performed at [facility]    Report Status PENDING  Incomplete  Culture, blood (routine x 2)     Status: None (Preliminary result)   Collection Time: 07/30/14 12:40 PM  Result Value Ref Range Status   Specimen Description BLOOD LEFT HAND  Final   Special Requests BOTTLES DRAWN AEROBIC ONLY 10CC  Final   Culture   Final           BLOOD CULTURE RECEIVED NO GROWTH TO DATE CULTURE WILL BE HELD FOR 5 DAYS BEFORE ISSUING A FINAL NEGATIVE REPORT Performed at [facility]    Report Status PENDING  Incomplete    Radiology Reports Ct Head Wo Contrast  07/23/2014   CLINICAL DATA:  Right hip pain worsening today starting yesterday. Altered mental status. Flashback to [place]. No known injury.  EXAM: CT HEAD WITHOUT CONTRAST  TECHNIQUE: Contiguous axial images were obtained from the base of the skull through the vertex without intravenous contrast.  COMPARISON:  None.  FINDINGS: Ventricles and sulci appear symmetrical. No mass effect or midline shift. No abnormal extra-axial fluid collections. Gray-white matter junctions are distinct. Basal cisterns are not effaced. No evidence of acute intracranial hemorrhage. No depressed skull fractures. Mucosal thickening in the paranasal sinuses. Mastoid air cells are not opacified.  IMPRESSION:  No acute intracranial abnormalities. Mucosal thickening in the paranasal sinuses.   Electronically Signed   By: Lucienne Capers M.D.   On: 07/23/2014 02:57   Mr Lumbar Spine Wo Contrast  07/23/2014   CLINICAL DATA:  Low back and left leg pain for 1 week. Previous history of lumbar surgery.  EXAM: MRI LUMBAR SPINE WITHOUT CONTRAST  TECHNIQUE: Multiplanar, multisequence MR imaging of the lumbar spine was performed. No intravenous contrast was administered.  COMPARISON:  None.  FINDINGS: Examination is limited due to motion degradation. Patient refused to finish the examination. No STIR sagittal images or postcontrast imaging was performed. Normal overall alignment of the lumbar vertebral bodies. They demonstrate normal marrow signal. Multilevel Schmorl's nodes are noted. The facets are normally aligned. No pars defects. No significant paraspinal or retroperitoneal findings. Mild bladder distention is noted.  Axial images are limited.  L1-2: Mild bulging annulus asymmetric right with minimal impression on the thecal sac. No spinal or foraminal stenosis.  L2-3: Bulging degenerated annulus and mild osteophytic ridging with mild bilateral lateral recess encroachment. There is also a annular fissure and a small right paracentral disc protrusion with caudal down turning. There is mild mass effect on the right side of the thecal sac and this could affect the right L3 nerve root in the lateral recess. No spinal stenosis. No foraminal stenosis.  L3-4: Diffuse bulging annulus, osteophytic ridging and facet disease contribute to mild bilateral lateral recess stenosis. No significant spinal or foraminal stenosis.  L4-5: Mild bulging annulus and moderate facet disease with flattening of the ventral thecal sac and mild bilateral lateral recess encroachment. Shallow right foraminal disc protrusion appears to contact and slightly displace the right L4 nerve root.  L5-S1: Left-sided laminectomy defect noted. There is a diffuse  bulging annulus but no spinal stenosis. There is a left foraminal disc osteophyte complex which narrows the neural foramen it may affect the left L5 nerve root.  IMPRESSION: 1. Degenerative lumbar spondylosis with multilevel disc disease and facet disease. 2. Limited examination by motion degradation. Patient also refused the postcontrast imaging. 3. Annular fissure  and small right paracentral disc protrusion and L2-3. This may affect the right L3 nerve root in the lateral recess. 4. Shallow right foraminal disc protrusion at L4-5 possibly irritating the right L4 nerve root. 5. Postoperative changes at L5-S1. There is a left foraminal disc osteophyte complex with left foraminal narrowing which could affect the left L5 nerve root.   Electronically Signed   By: Marijo Sanes M.D.   On: 07/23/2014 13:38   Ir Fluoro Guide Ndl Plmt / Bx  07/29/2014   CLINICAL DATA:  Spondylosis without myelopathy. Severe right low back, hip and leg pain going all way to the foot. Foraminal disc on the right at L4-5 by MR.  EXAM: EXAM Attempted right L4 NERVE ROOT BLOCK WITH TRANSFORAMINAL EPIDURAL STEROID INJECTION. Procedure unsuccessful.  TECHNIQUE: Overlying skin prepped with Betadine, draped in the usual sterile fashion. Curved 22 gauge spinal needle directed into the right L4-5 neural foramen. I attempted to inject a few drops of Omnipaque 180. I could not achieve a needle position that would allow injection of diagnostic amount of contrast and certainly would not allow administration of 3-4 cc of steroid and anesthetic. I tried several different positions and the procedure was simply far too painful for the patient. He and I decided to forego the injection at that point.  FLUOROSCOPY TIME:  1.7 min.  184.7 mGy.  IMPRESSION: Technically unsuccessful right L4selective nerve root block. See above discussion.   Electronically Signed   By: Nelson Chimes M.D.   On: 07/29/2014 10:54   Dg Hip Unilat With Pelvis 1v Right  07/22/2014    CLINICAL DATA:  Patient fell yesterday. Severe right hip pain. Altered mental status.  EXAM: RIGHT HIP (WITH PELVIS) 1 VIEW  COMPARISON:  None.  FINDINGS: Pelvis and right hip appear intact. No acute displaced fractures identified. SI joints and symphysis pubis are not displaced. No focal bone lesion or bone destruction. Bone cortex and trabecular architecture appear intact. Calcified phleboliths.  IMPRESSION: No acute bony abnormalities.   Electronically Signed   By: Lucienne Capers M.D.   On: 07/22/2014 22:47    CBC  Recent Labs Lab 07/27/14 0805 07/27/14 1647 07/30/14 0534 07/31/14 0610  WBC 17.2* 12.1* 13.3* 13.1*  HGB 16.6 15.0 15.3 14.9  HCT 48.1 45.0 45.2 44.8  PLT 309 302 259 255  MCV 90.8 90.0 90.2 91.6  MCH 31.3 30.0 30.5 30.5  MCHC 34.5 33.3 33.8 33.3  RDW 13.4 13.4 13.3 13.5  LYMPHSABS 3.3  --   --   --   MONOABS 1.9*  --   --   --   EOSABS 0.1  --   --   --   BASOSABS 0.0  --   --   --     Chemistries   Recent Labs Lab 07/27/14 0805 07/27/14 1647 07/31/14 0903  NA 138  --  135  K 3.8  --  4.1  CL 107  --  102  CO2 22  --  23  GLUCOSE 165*  --  188*  BUN 19  --  8  CREATININE 0.86 0.82 0.70  CALCIUM 9.8  --  9.5  AST 28  --   --   ALT 40  --   --   ALKPHOS 77  --   --   BILITOT 0.7  --   --    ------------------------------------------------------------------------------------------------------------------ estimated creatinine clearance is 108.5 mL/min (by C-G formula based on Cr of 0.7). ------------------------------------------------------------------------------------------------------------------ No results for input(s): HGBA1C  in the last 72 hours. ------------------------------------------------------------------------------------------------------------------ No results for input(s): CHOL, HDL, LDLCALC, TRIG, CHOLHDL, LDLDIRECT in the last 72  hours. ------------------------------------------------------------------------------------------------------------------ No results for input(s): TSH, T4TOTAL, T3FREE, THYROIDAB in the last 72 hours.  Invalid input(s): FREET3 ------------------------------------------------------------------------------------------------------------------ No results for input(s): VITAMINB12, FOLATE, FERRITIN, TIBC, IRON, RETICCTPCT in the last 72 hours.  Coagulation profile No results for input(s): INR, PROTIME in the last 168 hours.  No results for input(s): DDIMER in the last 72 hours.  Cardiac Enzymes No results for input(s): CKMB, TROPONINI, MYOGLOBIN in the last 168 hours.  Invalid input(s): CK ------------------------------------------------------------------------------------------------------------------ Invalid input(s): POCBNP    Thurnell Lose M.D on 07/31/2014 at 11:54 AM  Between 7am to 7pm - Pager - [phone], After 7pm go to [email or web address] - password [facility]  Office  [phone]

## 2014-07-31 NOTE — Progress Notes (Signed)
Pt doing the same. Good strength. States he is ready for surgery. Once again discussed the surgery, its recovery, its risks, and typical outcomes.

## 2014-07-31 NOTE — Progress Notes (Signed)
Physical Therapy Treatment Patient Details Name: Philip [name] MRN: [id number] DOB: 20-Nov-1950 Today's Date: 07/31/2014    History of Present Illness Patient is a 64 y/o male admitted with intractable back pain.  History of remote laminectomies, now with Right paracentral disc protrusion L2-3; R foraminal protrusion L4-5 with nerve root compression and left foraminal disc osteophyte complex with left foraminal narrowing which could affect the left L5 nerve root    PT Comments    Limited by pain despite coordinating medications with RN prior to therapy session. Pt at supervision to min guard level for mobility. Unable to sit on [facility] for more than a few seconds due to increased RLE pain. Reviewed safe mobility techniques with back precautions taught for comfort. Patient will continue to benefit from skilled physical therapy services to further improve independence with functional mobility.   Follow Up Recommendations  Home health PT;Supervision - Intermittent     Equipment Recommendations  None recommended by PT    Recommendations for Other Services       Precautions / Restrictions Precautions Precautions: Fall Precaution Comments: Educated on back precautions for comfort Restrictions Weight Bearing Restrictions: No    Mobility  Bed Mobility Overal bed mobility: Needs Assistance Bed Mobility: Rolling;Sidelying to Sit;Sit to Sidelying Rolling: Supervision Sidelying to sit: Supervision     Sit to sidelying: Min assist General bed mobility comments: Supervision for safety with rolling towards Rt side via log roll technique. Educated on technique with cues throughout. Min assist to scoot RLE back into bed after transitioning from Sit to sidelying.  Transfers Overall transfer level: Needs assistance Equipment used: Rolling walker (2 wheeled) Transfers: Sit to/from [facility] Sit to Stand: Min guard Stand pivot transfers: Min guard       General transfer  comment: Min guard for safety. VC to maintain a neutral spinal alignment for comfort. Cues for hand placement. Performed to and from bed/BSC. Unable to tolerate sitting BSC more than a few seconds due to increased RLE pain. Unwilling to try different surface to sit on.  Ambulation/Gait                 Stairs            Wheelchair Mobility    Modified Rankin (Stroke Patients Only)       Balance                                    Cognition Arousal/Alertness: Awake/alert Behavior During Therapy: WFL for tasks assessed/performed Overall Cognitive Status: Within Functional Limits for tasks assessed                      Exercises      General Comments General comments (skin integrity, edema, etc.): Pt reports too much pain to attempt ambulating or sitting on different surfaces, wanted to return to supine in bed.      Pertinent Vitals/Pain Pain Assessment: 0-10 Pain Score:  ("It's worse when I try to sit on the commode" no value given) Pain Location: Rt buttocks, hip, radiating to ankle Pain Descriptors / Indicators: Burning;Radiating Pain Intervention(s): Limited activity within patient's tolerance;Monitored during session;Repositioned;Patient requesting pain meds-RN notified;Relaxation;Utilized relaxation techniques    Home Living                      Prior Function  PT Goals (current goals can now be found in the care plan section) Acute Rehab PT Goals Patient Stated Goal: Use the [facility] PT Goal Formulation: With patient Time For Goal Achievement: 08/06/14 Potential to Achieve Goals: Good Progress towards PT goals: Progressing toward goals    Frequency  Min 3X/week    PT Plan Current plan remains appropriate    Co-evaluation             End of Session Equipment Utilized During Treatment: Gait belt Activity Tolerance: Patient limited by pain Patient left: in bed;with call bell/phone within reach      Time: 1053-1106 PT Time Calculation (min) (ACUTE ONLY): 13 min  Charges:  $Therapeutic Activity: 8-22 mins                    G Codes:      Ellouise Newer August 05, 2014, 11:42 AM Elayne Snare, [place]

## 2014-08-01 ENCOUNTER — Encounter (HOSPITAL_COMMUNITY)
Admission: EM | Disposition: A | Discharge status: Home / Self Care | Payer: Self-pay | Source: Home / Self Care | Attending: Internal Medicine

## 2014-08-01 ENCOUNTER — Inpatient Hospital Stay (HOSPITAL_COMMUNITY): Payer: BLUE CROSS/BLUE SHIELD

## 2014-08-01 ENCOUNTER — Inpatient Hospital Stay (HOSPITAL_COMMUNITY): Payer: BLUE CROSS/BLUE SHIELD | Admitting: Anesthesiology

## 2014-08-01 ENCOUNTER — Encounter (HOSPITAL_COMMUNITY): Payer: Self-pay | Admitting: Anesthesiology

## 2014-08-01 DIAGNOSIS — Z9889 Other specified postprocedural states: Secondary | ICD-10-CM

## 2014-08-01 HISTORY — PX: LUMBAR LAMINECTOMY/DECOMPRESSION MICRODISCECTOMY: SHX5026

## 2014-08-01 LAB — GLUCOSE, CAPILLARY
Glucose-Capillary: 147 mg/dL — ABNORMAL HIGH (ref 70–99)
Glucose-Capillary: 136 mg/dL — ABNORMAL HIGH (ref 70–99)
Glucose-Capillary: 184 mg/dL — ABNORMAL HIGH (ref 70–99)
Glucose-Capillary: 182 mg/dL — ABNORMAL HIGH (ref 70–99)

## 2014-08-01 LAB — URINE CULTURE: Colony Count: >=100000

## 2014-08-01 LAB — SURGICAL PCR SCREEN
MRSA, PCR: NEGATIVE
Staphylococcus aureus: POSITIVE — AB

## 2014-08-01 SURGERY — LUMBAR LAMINECTOMY/DECOMPRESSION MICRODISCECTOMY 1 LEVEL
Anesthesia: General | Site: Back | Laterality: Right

## 2014-08-01 MED ORDER — BACITRACIN 50000 UNITS IM SOLR
INTRAMUSCULAR | Status: DC | PRN
  Administered 2014-08-01: 500 mL

## 2014-08-01 MED ORDER — ACETAMINOPHEN 650 MG RE SUPP: 650.0000 mg | RECTAL | Status: DC | PRN

## 2014-08-01 MED ORDER — HYDROMORPHONE HCL 1 MG/ML IJ SOLN
INTRAMUSCULAR | Status: AC
  Filled 2014-08-01: qty 1

## 2014-08-01 MED ORDER — SODIUM CHLORIDE 0.9 % IJ SOLN
3.0000 mL | INTRAMUSCULAR | Status: DC | PRN
Start: 2014-08-01 — End: 2014-08-02

## 2014-08-01 MED ORDER — PROPOFOL 10 MG/ML IV BOLUS
INTRAVENOUS | Status: DC | PRN
  Administered 2014-08-01: 200 mg via INTRAVENOUS

## 2014-08-01 MED ORDER — 0.9 % SODIUM CHLORIDE (POUR BTL) OPTIME
TOPICAL | Status: DC | PRN
  Administered 2014-08-01: 1000 mL

## 2014-08-01 MED ORDER — HYDROMORPHONE HCL 1 MG/ML IJ SOLN
0.2500 mg | INTRAMUSCULAR | Status: DC | PRN
  Administered 2014-08-01 (×4): 0.5 mg via INTRAVENOUS

## 2014-08-01 MED ORDER — SODIUM CHLORIDE 0.9 % IJ SOLN: 3.0000 mL | Freq: Two times a day (BID) | INTRAMUSCULAR | Status: DC

## 2014-08-01 MED ORDER — PROMETHAZINE HCL 25 MG/ML IJ SOLN: 6.2500 mg | INTRAMUSCULAR | Status: DC | PRN

## 2014-08-01 MED ORDER — MIDAZOLAM HCL 2 MG/2ML IJ SOLN
INTRAMUSCULAR | Status: AC
  Filled 2014-08-01: qty 2

## 2014-08-01 MED ORDER — SODIUM CHLORIDE 0.9 % IV SOLN: 250.0000 mL | INTRAVENOUS | Status: DC

## 2014-08-01 MED ORDER — MORPHINE SULFATE 2 MG/ML IJ SOLN: 1.0000 mg | INTRAMUSCULAR | Status: DC | PRN

## 2014-08-01 MED ORDER — ACETAMINOPHEN 325 MG PO TABS: 650.0000 mg | ORAL_TABLET | ORAL | Status: DC | PRN

## 2014-08-01 MED ORDER — CEFAZOLIN SODIUM 1-5 GM-% IV SOLN
1.0000 g | Freq: Three times a day (TID) | INTRAVENOUS | Status: AC
  Administered 2014-08-01 – 2014-08-02 (×2): 1 g via INTRAVENOUS
  Filled 2014-08-01 (×2): qty 50

## 2014-08-01 MED ORDER — ONDANSETRON HCL 4 MG/2ML IJ SOLN
INTRAMUSCULAR | Status: DC | PRN
  Administered 2014-08-01: 4 mg via INTRAVENOUS

## 2014-08-01 MED ORDER — ONDANSETRON HCL 4 MG/2ML IJ SOLN: 4.0000 mg | INTRAMUSCULAR | Status: DC | PRN

## 2014-08-01 MED ORDER — INSULIN GLARGINE 100 UNIT/ML ~~LOC~~ SOLN
6.0000 [IU] | Freq: Every day | SUBCUTANEOUS | Status: DC
Start: 2014-08-01 — End: 2014-08-02
  Administered 2014-08-01: 6 [IU] via SUBCUTANEOUS
  Filled 2014-08-01 (×2): qty 0.06

## 2014-08-01 MED ORDER — POTASSIUM CHLORIDE IN NACL 20-0.9 MEQ/L-% IV SOLN
INTRAVENOUS | Status: DC
  Administered 2014-08-01 – 2014-08-02 (×2): via INTRAVENOUS
  Filled 2014-08-01 (×6): qty 1000

## 2014-08-01 MED ORDER — THROMBIN 5000 UNITS EX SOLR
CUTANEOUS | Status: DC | PRN
  Administered 2014-08-01 (×2): 5000 [IU] via TOPICAL

## 2014-08-01 MED ORDER — VECURONIUM BROMIDE 10 MG IV SOLR
INTRAVENOUS | Status: DC | PRN
  Administered 2014-08-01: 7 mg via INTRAVENOUS

## 2014-08-01 MED ORDER — LACTATED RINGERS IV SOLN
INTRAVENOUS | Status: DC | PRN
  Administered 2014-08-01: 09:00:00 via INTRAVENOUS

## 2014-08-01 MED ORDER — MENTHOL 3 MG MT LOZG: 1.0000 | LOZENGE | OROMUCOSAL | Status: DC | PRN

## 2014-08-01 MED ORDER — FENTANYL CITRATE 0.05 MG/ML IJ SOLN
INTRAMUSCULAR | Status: DC | PRN
  Administered 2014-08-01 (×2): 25 ug via INTRAVENOUS
  Administered 2014-08-01 (×4): 50 ug via INTRAVENOUS

## 2014-08-01 MED ORDER — HEMOSTATIC AGENTS (NO CHARGE) OPTIME
TOPICAL | Status: DC | PRN
  Administered 2014-08-01: 1 via TOPICAL

## 2014-08-01 MED ORDER — ARTIFICIAL TEARS OP OINT
TOPICAL_OINTMENT | OPHTHALMIC | Status: DC | PRN
  Administered 2014-08-01: 1 via OPHTHALMIC

## 2014-08-01 MED ORDER — LIDOCAINE HCL 4 % MT SOLN
OROMUCOSAL | Status: DC | PRN
  Administered 2014-08-01: 4 mL via TOPICAL

## 2014-08-01 MED ORDER — FENTANYL CITRATE 0.05 MG/ML IJ SOLN
INTRAMUSCULAR | Status: AC
  Filled 2014-08-01: qty 5

## 2014-08-01 MED ORDER — CELECOXIB 200 MG PO CAPS
200.0000 mg | ORAL_CAPSULE | Freq: Two times a day (BID) | ORAL | Status: DC
  Administered 2014-08-01 – 2014-08-02 (×3): 200 mg via ORAL
  Filled 2014-08-01 (×3): qty 1

## 2014-08-01 MED ORDER — GLYCOPYRROLATE 0.2 MG/ML IJ SOLN
INTRAMUSCULAR | Status: DC | PRN
  Administered 2014-08-01: 0.6 mg via INTRAVENOUS

## 2014-08-01 MED ORDER — OXYCODONE-ACETAMINOPHEN 5-325 MG PO TABS
1.0000 | ORAL_TABLET | ORAL | Status: DC | PRN
  Administered 2014-08-01 (×2): 2 via ORAL
  Filled 2014-08-01 (×2): qty 2

## 2014-08-01 MED ORDER — NEOSTIGMINE METHYLSULFATE 10 MG/10ML IV SOLN
INTRAVENOUS | Status: DC | PRN
  Administered 2014-08-01: 4 mg via INTRAVENOUS

## 2014-08-01 MED ORDER — METHOCARBAMOL 1000 MG/10ML IJ SOLN
500.0000 mg | Freq: Four times a day (QID) | INTRAVENOUS | Status: DC | PRN
  Administered 2014-08-01: 500 mg via INTRAVENOUS
  Filled 2014-08-01 (×2): qty 5

## 2014-08-01 MED ORDER — PHENOL 1.4 % MT LIQD: 1.0000 | OROMUCOSAL | Status: DC | PRN

## 2014-08-01 MED ORDER — BUPIVACAINE HCL (PF) 0.25 % IJ SOLN
INTRAMUSCULAR | Status: DC | PRN
  Administered 2014-08-01: 7 mL

## 2014-08-01 MED ORDER — METHOCARBAMOL 500 MG PO TABS
500.0000 mg | ORAL_TABLET | Freq: Four times a day (QID) | ORAL | Status: DC | PRN
  Administered 2014-08-01: 500 mg via ORAL
  Filled 2014-08-01: qty 1

## 2014-08-01 MED ORDER — METHYLPREDNISOLONE ACETATE 80 MG/ML IJ SUSP
INTRAMUSCULAR | Status: DC | PRN
  Administered 2014-08-01: 80 mg

## 2014-08-01 MED ORDER — HYDROMORPHONE HCL 1 MG/ML IJ SOLN: 0.2500 mg | INTRAMUSCULAR | Status: DC | PRN

## 2014-08-01 MED ORDER — LIDOCAINE HCL (CARDIAC) 20 MG/ML IV SOLN
INTRAVENOUS | Status: DC | PRN
  Administered 2014-08-01: 50 mg via INTRAVENOUS

## 2014-08-01 MED ORDER — SENNA 8.6 MG PO TABS
1.0000 | ORAL_TABLET | Freq: Two times a day (BID) | ORAL | Status: DC
  Administered 2014-08-01: 8.6 mg via ORAL
  Filled 2014-08-01 (×2): qty 1

## 2014-08-01 SURGICAL SUPPLY — 42 items
BAG DECANTER FOR FLEXI CONT (MISCELLANEOUS) ×2 IMPLANT
BENZOIN TINCTURE PRP APPL 2/3 (GAUZE/BANDAGES/DRESSINGS) ×2 IMPLANT
BUR MATCHSTICK NEURO 3.0 LAGG (BURR) ×2 IMPLANT
CANISTER SUCT 3000ML PPV (MISCELLANEOUS) ×2 IMPLANT
CONT SPEC 4OZ CLIKSEAL STRL BL (MISCELLANEOUS) ×2 IMPLANT
DRAPE LAPAROTOMY 100X72X124 (DRAPES) ×2 IMPLANT
DRAPE MICROSCOPE LEICA (MISCELLANEOUS) ×2 IMPLANT
DRAPE POUCH INSTRU U-SHP 10X18 (DRAPES) ×2 IMPLANT
DRAPE SURG 17X23 STRL (DRAPES) ×2 IMPLANT
DRSG OPSITE 4X5.5 SM (GAUZE/BANDAGES/DRESSINGS) ×2 IMPLANT
DRSG OPSITE POSTOP 3X4 (GAUZE/BANDAGES/DRESSINGS) ×2 IMPLANT
DRSG TELFA 3X8 NADH (GAUZE/BANDAGES/DRESSINGS) ×2 IMPLANT
DURAPREP 26ML APPLICATOR (WOUND CARE) ×2 IMPLANT
ELECT REM PT RETURN 9FT ADLT (ELECTROSURGICAL) ×2
ELECTRODE REM PT RTRN 9FT ADLT (ELECTROSURGICAL) ×1 IMPLANT
GAUZE SPONGE 4X4 16PLY XRAY LF (GAUZE/BANDAGES/DRESSINGS) IMPLANT
GLOVE BIO SURGEON STRL SZ8 (GLOVE) ×2 IMPLANT
GOWN STRL REUS W/ TWL LRG LVL3 (GOWN DISPOSABLE) IMPLANT
GOWN STRL REUS W/ TWL XL LVL3 (GOWN DISPOSABLE) ×1 IMPLANT
GOWN STRL REUS W/TWL 2XL LVL3 (GOWN DISPOSABLE) IMPLANT
GOWN STRL REUS W/TWL LRG LVL3 (GOWN DISPOSABLE)
GOWN STRL REUS W/TWL XL LVL3 (GOWN DISPOSABLE) ×1
HEMOSTAT POWDER KIT SURGIFOAM (HEMOSTASIS) IMPLANT
KIT BASIN OR (CUSTOM PROCEDURE TRAY) ×2 IMPLANT
KIT ROOM TURNOVER OR (KITS) ×2 IMPLANT
NEEDLE HYPO 25X1 1.5 SAFETY (NEEDLE) ×2 IMPLANT
NEEDLE SPNL 20GX3.5 QUINCKE YW (NEEDLE) IMPLANT
NS IRRIG 1000ML POUR BTL (IV SOLUTION) ×2 IMPLANT
PACK LAMINECTOMY NEURO (CUSTOM PROCEDURE TRAY) ×2 IMPLANT
PAD ARMBOARD 7.5X6 YLW CONV (MISCELLANEOUS) ×6 IMPLANT
RUBBERBAND STERILE (MISCELLANEOUS) ×4 IMPLANT
SPONGE SURGIFOAM ABS GEL SZ50 (HEMOSTASIS) ×2 IMPLANT
STRIP CLOSURE SKIN 1/2X4 (GAUZE/BANDAGES/DRESSINGS) ×2 IMPLANT
SUT VIC AB 0 CT1 18XCR BRD8 (SUTURE) ×1 IMPLANT
SUT VIC AB 0 CT1 8-18 (SUTURE) ×1
SUT VIC AB 2-0 CP2 18 (SUTURE) ×2 IMPLANT
SUT VIC AB 3-0 SH 8-18 (SUTURE) ×2 IMPLANT
SYR 20ML ECCENTRIC (SYRINGE) ×2 IMPLANT
TAPE STRIPS DRAPE STRL (GAUZE/BANDAGES/DRESSINGS) ×2 IMPLANT
TOWEL OR 17X24 6PK STRL BLUE (TOWEL DISPOSABLE) ×2 IMPLANT
TOWEL OR 17X26 10 PK STRL BLUE (TOWEL DISPOSABLE) ×2 IMPLANT
WATER STERILE IRR 1000ML POUR (IV SOLUTION) ×2 IMPLANT

## 2014-08-01 NOTE — Anesthesia Procedure Notes (Signed)
Procedure Name: Intubation Date/Time: 08/01/2014 9:34 AM Performed by: Suzy Bouchard Pre-anesthesia Checklist: Patient identified, Timeout performed, Suction available, Patient being monitored and Emergency Drugs available Patient Re-evaluated:Patient Re-evaluated prior to inductionOxygen Delivery Method: Circle system utilized Preoxygenation: Pre-oxygenation with 100% oxygen Intubation Type: IV induction Ventilation: Mask ventilation without difficulty Laryngoscope Size: Miller and 2 Grade View: Grade II Tube type: Oral Tube size: 7.5 mm Number of attempts: 1 Airway Equipment and Method: Stylet and LTA kit utilized Placement Confirmation: ETT inserted through vocal cords under direct vision,  breath sounds checked- equal and bilateral and positive ETCO2 Secured at: 21 cm Tube secured with: Tape Dental Injury: Teeth and Oropharynx as per pre-operative assessment

## 2014-08-01 NOTE — Op Note (Signed)
07/27/2014 - 08/01/2014  10:45 AM  PATIENT:  Philip [name]  64 y.o. male  PRE-OPERATIVE DIAGNOSIS:  Right L4-5 extraforaminal herniated nucleus pulposus with right L4 radiculopathy  POST-OPERATIVE DIAGNOSIS:  Same  PROCEDURE:  Right L4-5 extraforaminal microdiscectomy  SURGEON:  Sherley Bounds, MD  ASSISTANTS: Dr. Vertell Limber  ANESTHESIA:   General  EBL: Less than 25 ml     BLOOD ADMINISTERED:none  DRAINS: None   SPECIMEN:  No Specimen  INDICATION FOR PROCEDURE: This patient was admitted to the hospitalist service this past weekend with severe right leg pain in an L4 distribution. MRI showed an extra foraminal disc herniation at L4-5 on the right. He tried medical management including steroids and a attempted right L4 nerve root block without relief. His pain was debilitating. He was bedridden. I recommended An L4-5 extra foraminal microdiscectomy. Patient understood the risks, benefits, and alternatives and potential outcomes and wished to proceed.  PROCEDURE DETAILS: The patient was taken to the operating room and after induction of adequate generalized endotracheal anesthesia, the patient was rolled into the prone position on the Wilson frame and all pressure points were padded. The lumbar region was cleaned and then prepped with DuraPrep and draped in the usual sterile fashion. 5 cc of local anesthesia was injected and then a dorsal midline incision was made and carried down to the lumbo sacral fascia. The fascia was opened and the paraspinous musculature was taken down in a subperiosteal fashion to expose L4-5 on the right. Intraoperative x-ray confirmed my level, and then I used a combination of the high-speed drill and the Kerrison punches to perform a extraforaminal exposure of the L4 nerve root on the right. The underlying yellow ligament was opened and removed in a piecemeal fashion to expose the underlying exiting nerve root. I undercut the lateral recess and dissected down until I was  medial to and distal to the pedicle. The nerve root was well decompressed. We then gently retracted the nerve root superiorly and coagulated the epidural venous vasculature, and found a large free fragment in the axilla of the L4 nerve root. This was removed with a nerve hook. I then incised the disc space. I performed a thorough intradiscal discectomy with pituitary rongeurs and curettes, until I had a nice decompression of the nerve root. I then palpated with a coronary dilator along the nerve root and into the foramen to assure adequate decompression. I felt no more compression of the nerve root. I irrigated with saline solution containing bacitracin. Achieved hemostasis with bipolar cautery, lined the dura with Gelfoam, and then closed the fascia with 0 Vicryl. I closed the subcutaneous tissues with 2-0 Vicryl and the subcuticular tissues with 3-0 Vicryl. The skin was then closed with benzoin and Steri-Strips. The drapes were removed, a sterile dressing was applied. The patient was awakened from general anesthesia and transferred to the recovery room in stable condition. At the end of the procedure all sponge, needle and instrument counts were correct.   PLAN OF CARE: Admit to inpatient   PATIENT DISPOSITION:  PACU - hemodynamically stable.   Delay start of Pharmacological VTE agent (>24hrs) due to surgical blood loss or risk of bleeding:  yes

## 2014-08-01 NOTE — Transfer of Care (Signed)
Immediate Anesthesia Transfer of Care Note  Patient: Philip [name]  Procedure(s) Performed: Procedure(s) with comments: Right L/4-5 Extraforaminal Diskectomy (Right) - Right L/4-5 Extraforaminal Diskectomy  Patient Location: PACU  Anesthesia Type:General  Level of Consciousness: awake and alert   Airway & Oxygen Therapy: Patient Spontanous Breathing  Post-op Assessment: Report given to RN, Post -op Vital signs reviewed and stable and Patient moving all extremities X 4  Post vital signs: Reviewed and stable  Last Vitals:  Filed Vitals:   08/01/14 1053  BP:   Pulse:   Temp: 36.9 C  Resp:     Complications: No apparent anesthesia complications

## 2014-08-01 NOTE — Progress Notes (Signed)
Upon rounding, patient appears upset that he has "been waiting for an hour to use the bathroom". Prior to this, patient did report to NA [facility]) that he will need to use the bathroom once his pain subside from ambulating in the hallway with PT. NA asked patient to call and let staffs knows once he need. Patient did not call any staff but did yell out. Agricultural consultant at bedside. Writer assisted patient to bathroom and back. Patient verbalized that he wants to be signed off independent. I explained the risks of fall ans safety precautions reviewed which he appears annoy by this reminder. He also verbalized that Dr. Ronnald Ramp needed to call him ASAP. Dr. Ronnald Ramp called and aware of situation. I informed patient that MD will call him soon. Will continue to monitor.  Ave Filter, RN

## 2014-08-01 NOTE — Anesthesia Postprocedure Evaluation (Signed)
  Anesthesia Post-op Note  Patient: Philip [name]  Procedure(s) Performed: Procedure(s) with comments: Right L/4-5 Extraforaminal Diskectomy (Right) - Right L/4-5 Extraforaminal Diskectomy  Patient Location: PACU  Anesthesia Type:General  Level of Consciousness: awake and alert   Airway and Oxygen Therapy: Patient Spontanous Breathing  Post-op Pain: mild  Post-op Assessment: Post-op Vital signs reviewed  Post-op Vital Signs: Reviewed  Last Vitals:  Filed Vitals:   08/01/14 1145  BP:   Pulse: 88  Temp: 36.9 C  Resp: 13    Complications: No apparent anesthesia complications

## 2014-08-01 NOTE — Progress Notes (Signed)
Physical Therapy Treatment/Re-Evaluation Patient Details Name: Philip [name] MRN: [id number] DOB: October 21, 1950 Today's Date: 08/01/2014    History of Present Illness Patient is a 64 y/o male admitted with intractable back pain.  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  Pt s/p R L4-5 extraformainal microdiscectomy on 08/01/14.    PT Comments    Patient is s/p above surgery resulting in functional limitations due to the deficits listed below (see PT Problem List). Pt ambulated 800 ft around floor and once back in room R knee buckle x2 which pt reports was because "he took a wrong step".  Pt denies any pain and reports only soreness where he had surgery.  Patient will benefit from skilled PT to increase their independence and safety with mobility to allow discharge to the venue listed below.  Discussed w/ pt the importance of taking breaks when he notices his legs begin to fatigue to avoid knee buckling episode, pt verbalized understanding.   Follow Up Recommendations  Home health PT;Supervision - Intermittent     Equipment Recommendations  None recommended by PT    Recommendations for Other Services       Precautions / Restrictions Precautions Precautions: Back;Fall Precaution Comments: Educated on back precautions for comfort Restrictions Weight Bearing Restrictions: No    Mobility  Bed Mobility Overal bed mobility: Needs Assistance Bed Mobility: Rolling;Sidelying to Sit Rolling: Supervision Sidelying to sit: Supervision       General bed mobility comments: Min verbal cues needed for proper demonstration of log roll technique to the R.  Pt used handrail to push up from sidelying.  Transfers Overall transfer level: Needs assistance Equipment used: Rolling walker (2  wheeled) Transfers: Sit to/from Stand Sit to Stand: Supervision         General transfer comment: Supervision for safety.  Verbal cues to push from bed rather than pull on RW.  Ambulation/Gait Ambulation/Gait assistance: Min guard;Supervision Ambulation Distance (Feet): 800 Feet Assistive device: Rolling walker (2 wheeled) Gait Pattern/deviations: Step-through pattern   Gait velocity interpretation: at or above normal speed for age/gender General Gait Details: Normal speed for age/gender.  Knee buckle x2 once return to room after ambulating in hallway, pt reports this is because "he took a wrong step" and denies it is from weakness.  Pt able to stabilize independently w/ use of RW.     Stairs Stairs:  (Pt reports he was Ind w/ stairs prior to admission)          Wheelchair Mobility    Modified Rankin (Stroke Patients Only)       Balance Overall balance assessment: Needs assistance Sitting-balance support: No upper extremity supported;Feet supported Sitting balance-Leahy Scale: Good     Standing balance support: Bilateral upper extremity supported;During functional activity Standing balance-Leahy Scale: Fair                      Cognition Arousal/Alertness: Awake/alert Behavior During Therapy: WFL for tasks assessed/performed Overall Cognitive Status: Within Functional Limits for tasks assessed                      Exercises General Exercises - Lower Extremity Ankle Circles/Pumps: AROM;Both;10 reps;Supine    General Comments General comments (skin integrity, edema, etc.): BLE knee flexion/extension, DF/PF all 5/5.  R hip flexion 4/5, L hip flexion 4+/5.  Intact all dermatomes BLE.       Pertinent Vitals/Pain Pain Assessment: 0-10 Pain Score: 0-No pain Pain Location: Pt denies pain, says he is "sore" in his back where he has surgery Pain Descriptors / Indicators: Sore Pain Intervention(s): Limited activity within patient's  tolerance;Monitored during session;Repositioned    Home Living                      Prior Function            PT Goals (current goals can now be found in the care plan section) Acute Rehab PT Goals Patient Stated Goal: to walk in hallway PT Goal Formulation: With patient Time For Goal Achievement: 08/06/14 Potential to Achieve Goals: Good Progress towards PT goals: Progressing toward goals    Frequency  Min 3X/week    PT Plan Current plan remains appropriate    Co-evaluation             End of Session Equipment Utilized During Treatment: Gait belt Activity Tolerance: Patient tolerated treatment well Patient left: in chair;with call bell/phone within reach;with chair alarm set     Time: 3435-6861 PT Time Calculation (min) (ACUTE ONLY): 27 min  Charges:  $Gait Training: 8-22 mins                    G Codes:      Joslyn Hy PT, [place] [phone] Pager: [phone] 08/01/2014, 5:15 PM

## 2014-08-01 NOTE — Anesthesia Preprocedure Evaluation (Addendum)
Anesthesia Evaluation  Patient identified by MRN, date of birth, ID band Patient awake    Reviewed: Allergy & Precautions, NPO status , Patient's Chart, lab work & pertinent test results  Airway Mallampati: II  TM Distance: >3 FB Neck ROM: Full    Dental  (+) Upper Dentures, Lower Dentures   Pulmonary Current Smoker,  breath sounds clear to auscultation        Cardiovascular hypertension, Pt. on medications Rhythm:Regular Rate:Normal     Neuro/Psych  Neuromuscular disease    GI/Hepatic negative GI ROS, Neg liver ROS,   Endo/Other  diabetes, Poorly Controlled, Type 2, Oral Hypoglycemic AgentsMorbid obesity  Renal/GU negative Renal ROS     Musculoskeletal negative musculoskeletal ROS (+)   Abdominal   Peds  Hematology negative hematology ROS (+)   Anesthesia Other Findings   Reproductive/Obstetrics                            Anesthesia Physical Anesthesia Plan  ASA: III  Anesthesia Plan: General   Post-op Pain Management:    Induction: Intravenous  Airway Management Planned: Oral ETT  Additional Equipment:   Intra-op Plan:   Post-operative Plan: Extubation in OR  Informed Consent: I have reviewed the patients History and Physical, chart, labs and discussed the procedure including the risks, benefits and alternatives for the proposed anesthesia with the patient or authorized representative who has indicated his/her understanding and acceptance.   Dental advisory given  Plan Discussed with: CRNA  Anesthesia Plan Comments:         Anesthesia Quick Evaluation

## 2014-08-01 NOTE — Progress Notes (Signed)
[facility]                                                                              Patient Demographics  Philip [name], is a 64 y.o. male, DOB - 1950/12/20, VOJ:[id number]  Admit date - 07/27/2014   Admitting Physician Erline Hau, MD  Outpatient Primary MD for the patient is [name], MD  LOS - 5   Chief Complaint  Patient presents with  . Back Pain      HPI on 07/27/2014 by Dr. Lelon Frohlich Patient is a 64 year old man well-known to me for recent hospitalization for the same complaints. He has a history of hypertension and diabetes. During prior hospitalization he had an MRI of his lumbar spine that showed an annular fissure and small right paracentral disc protrusion at L2-3 which may affect the right L3 nerve root in the lateral recess. There is also a shallow right foraminal disc protrusion at L4-5 possibly irritating the right L4 nerve root. There was also a left foraminal disc osteophyte complex with left foraminal narrowing which could affect the left L5 nerve root. Above findings were discussed via telephone with neurosurgeon, Dr. Saintclair Halsted, who recommended a dose of IV steroids followed by an oral prednisone taper and discharge from the hospital to follow-up with him in 2 days. Patient did not allow for [place] to make appointment for him. He was discharged. He returns to the emergency department today saying that ever since the IV pain medication that he received on day of discharge wore off he has been in intractable pain, again unable to get off the floor and has been crawling on the floor ever since. EDP requested admission for pain control. I requested that he contact neurosurgeon in [place] for recommendations on how to proceed. He relates that after speaking with Dr. Sherley Bounds he has recommended transfer to [facility] where he will see patient in consultation and consider possibility of a local anesthetic block for pain relief.  Interim  history Patient admitted for back pain.  Neurosugery consulted and recommended nerve block by IR, however, procedure unable to be completed due to inflammation and patient pain. Pending further recommendations from neurosurgery. Patient continues to need IV pain medications. Physical therapy consulted and all going. Currently neurosurgery planning to operate on his back on 08/01/2014.   Assessment & Plan   Sciatica/Lower back pain -Radiation of pain down the right leg -MRI on 07/23/2014: Annular fissure and small right paracentral disc protrusion L2-3, mass effect right L3 nerve root -Patient has received 2 courses of prednisone taper, IR failed to provide nerve block due to location of patient's pain and nerve impingement. -Neurosurgery consulted and appreciated, Dr. Sherley Bounds and planning to operate on 08/01/2014.  - Continue supportive care.  He should be low-to-moderate risk for adverse cardio pulmonary outcome during the perioperative period due to his underlying hypertension, diabetes mellitus, multiple-pack-year ongoing smoking. Stable baseline EKG. No murmurs. He has good exercise capacity prior to his recent hospital admission.   We will order as needed IV Lopressor for heart rate and blood pressure control. Risks and benefits of surgery told to the patient he is agreeable to  proceed with the surgical procedure.    Hypertension -Stable, Continue lisinopril + PRN lopressor    Diabetes mellitus, type II with neuropathy -Metformin and Invokana held -Continue insulin sliding scale CBG monitoring -Continue gabapentin for neuropathy  Lab Results  Component Value Date   HGBA1C 8.0* 07/24/2014    CBG (last 3)   Recent Labs  07/31/14 1625 07/31/14 2153 08/01/14 0622  GLUCAP 179* 144* 147*     Depression anxiety -Continue Celexa   Leukocytosis  Has been afebrile, check chest x-ray and UA. Monitor    Code Status: Full  Family Communication: None at  bedside  Disposition Plan: Admitted, Patient continues to need IV pain control.  Time Spent in minutes   30 minutes   Procedures  None   Consults   Neurosurgery, Dr. Sherley Bounds Interventional radiology   DVT Prophylaxis  SCD  Lab Results  Component Value Date   PLT 255 07/31/2014    Medications  Scheduled Meds: . bisacodyl  10 mg Oral Daily  .  ceFAZolin (ANCEF) IV  2 g Intravenous On Call to OR  . citalopram  20 mg Oral Daily  . dexamethasone  10 mg Intravenous On Call to OR  . docusate sodium  200 mg Oral BID  . gabapentin  300 mg Oral TID  . insulin aspart  0-9 Units Subcutaneous TID WC  . insulin aspart  3 Units Subcutaneous TID WC  . insulin glargine  6 Units Subcutaneous QHS  . lactulose  30 g Oral BID  . lisinopril  20 mg Oral Daily  . polyethylene glycol  17 g Oral BID   Continuous Infusions: . sodium chloride 75 mL/hr at 07/31/14 2232   PRN Meds:.acetaminophen **OR** acetaminophen, HYDROmorphone (DILAUDID) injection, HYDROmorphone (DILAUDID) injection, methocarbamol (ROBAXIN)  IV, metoprolol, morphine injection, ondansetron **OR** ondansetron (ZOFRAN) IV, oxyCODONE, promethazine  Antibiotics    Anti-infectives    Start     Dose/Rate Route Frequency Ordered Stop   08/01/14 0600  ceFAZolin (ANCEF) IVPB 2 g/50 mL premix     2 g 100 mL/hr over 30 Minutes Intravenous On call to O.R. 07/31/14 1339 08/02/14 0559      Subjective:   Philip [name] seen and examined today.  He is in bed, denies any headache chest or abdominal pain. Does have back pain radiating to his right leg. No weakness.  Objective:   Filed Vitals:   07/31/14 1400 07/31/14 2242 08/01/14 0600 08/01/14 0846  BP: 123/76 99/55 131/70 139/73  Pulse: 89 81 81 88  Temp: 98.9 F (37.2 C) 98.6 F (37 C) 99 F (37.2 C) 99.1 F (37.3 C)  TempSrc:  Oral    Resp: 16 16 16 18   Height:      Weight:      SpO2: 95% 95% 95% 99%    Wt Readings from Last 3 Encounters:  07/28/14 98.612 kg  (217 lb 6.4 oz)  07/22/14 100.699 kg (222 lb)  07/23/14 100.699 kg (222 lb)     Intake/Output Summary (Last 24 hours) at 08/01/14 4696 Last data filed at 08/01/14 0800  Gross per 24 hour  Intake    360 ml  Output   2600 ml  Net  -2240 ml    Exam  General: Well developed, well nourished, NAD.   Cardiovascular: S1 S2 auscultated, RRR, no murmurs  Respiratory: Clear to auscultation  Abdomen: Soft, nontender, nondistended, + bowel sounds  Extremities: warm dry without cyanosis clubbing or edema  Neuro: AAOx3, nonfocal, intact sensation and  strength  Psych: Appropriate mood and affect  Data Review   Micro Results Recent Results (from the past 240 hour(s))  Culture, blood (routine x 2)     Status: None (Preliminary result)   Collection Time: 07/30/14 12:25 PM  Result Value Ref Range Status   Specimen Description BLOOD RIGHT ANTECUBITAL  Final   Special Requests BOTTLES DRAWN AEROBIC AND ANAEROBIC 10CC  Final   Culture   Final           BLOOD CULTURE RECEIVED NO GROWTH TO DATE CULTURE WILL BE HELD FOR 5 DAYS BEFORE ISSUING A FINAL NEGATIVE REPORT Performed at [facility]    Report Status PENDING  Incomplete  Culture, blood (routine x 2)     Status: None (Preliminary result)   Collection Time: 07/30/14 12:40 PM  Result Value Ref Range Status   Specimen Description BLOOD LEFT HAND  Final   Special Requests BOTTLES DRAWN AEROBIC ONLY 10CC  Final   Culture   Final           BLOOD CULTURE RECEIVED NO GROWTH TO DATE CULTURE WILL BE HELD FOR 5 DAYS BEFORE ISSUING A FINAL NEGATIVE REPORT Performed at [facility]    Report Status PENDING  Incomplete    Radiology Reports Ct Head Wo Contrast  07/23/2014   CLINICAL DATA:  Right hip pain worsening today starting yesterday. Altered mental status. Flashback to [place]. No known injury.  EXAM: CT HEAD WITHOUT CONTRAST  TECHNIQUE: Contiguous axial images were obtained from the base of the skull through the vertex  without intravenous contrast.  COMPARISON:  None.  FINDINGS: Ventricles and sulci appear symmetrical. No mass effect or midline shift. No abnormal extra-axial fluid collections. Gray-white matter junctions are distinct. Basal cisterns are not effaced. No evidence of acute intracranial hemorrhage. No depressed skull fractures. Mucosal thickening in the paranasal sinuses. Mastoid air cells are not opacified.  IMPRESSION: No acute intracranial abnormalities. Mucosal thickening in the paranasal sinuses.   Electronically Signed   By: Lucienne Capers M.D.   On: 07/23/2014 02:57   Mr Lumbar Spine Wo Contrast  07/23/2014   CLINICAL DATA:  Low back and left leg pain for 1 week. Previous history of lumbar surgery.  EXAM: MRI LUMBAR SPINE WITHOUT CONTRAST  TECHNIQUE: Multiplanar, multisequence MR imaging of the lumbar spine was performed. No intravenous contrast was administered.  COMPARISON:  None.  FINDINGS: Examination is limited due to motion degradation. Patient refused to finish the examination. No STIR sagittal images or postcontrast imaging was performed. Normal overall alignment of the lumbar vertebral bodies. They demonstrate normal marrow signal. Multilevel Schmorl's nodes are noted. The facets are normally aligned. No pars defects. No significant paraspinal or retroperitoneal findings. Mild bladder distention is noted.  Axial images are limited.  L1-2: Mild bulging annulus asymmetric right with minimal impression on the thecal sac. No spinal or foraminal stenosis.  L2-3: Bulging degenerated annulus and mild osteophytic ridging with mild bilateral lateral recess encroachment. There is also a annular fissure and a small right paracentral disc protrusion with caudal down turning. There is mild mass effect on the right side of the thecal sac and this could affect the right L3 nerve root in the lateral recess. No spinal stenosis. No foraminal stenosis.  L3-4: Diffuse bulging annulus, osteophytic ridging and facet  disease contribute to mild bilateral lateral recess stenosis. No significant spinal or foraminal stenosis.  L4-5: Mild bulging annulus and moderate facet disease with flattening of the ventral thecal sac and  mild bilateral lateral recess encroachment. Shallow right foraminal disc protrusion appears to contact and slightly displace the right L4 nerve root.  L5-S1: Left-sided laminectomy defect noted. There is a diffuse bulging annulus but no spinal stenosis. There is a left foraminal disc osteophyte complex which narrows the neural foramen it may affect the left L5 nerve root.  IMPRESSION: 1. Degenerative lumbar spondylosis with multilevel disc disease and facet disease. 2. Limited examination by motion degradation. Patient also refused the postcontrast imaging. 3. Annular fissure and small right paracentral disc protrusion and L2-3. This may affect the right L3 nerve root in the lateral recess. 4. Shallow right foraminal disc protrusion at L4-5 possibly irritating the right L4 nerve root. 5. Postoperative changes at L5-S1. There is a left foraminal disc osteophyte complex with left foraminal narrowing which could affect the left L5 nerve root.   Electronically Signed   By: Marijo Sanes M.D.   On: 07/23/2014 13:38   Ir Fluoro Guide Ndl Plmt / Bx  07/29/2014   CLINICAL DATA:  Spondylosis without myelopathy. Severe right low back, hip and leg pain going all way to the foot. Foraminal disc on the right at L4-5 by MR.  EXAM: EXAM Attempted right L4 NERVE ROOT BLOCK WITH TRANSFORAMINAL EPIDURAL STEROID INJECTION. Procedure unsuccessful.  TECHNIQUE: Overlying skin prepped with Betadine, draped in the usual sterile fashion. Curved 22 gauge spinal needle directed into the right L4-5 neural foramen. I attempted to inject a few drops of Omnipaque 180. I could not achieve a needle position that would allow injection of diagnostic amount of contrast and certainly would not allow administration of 3-4 cc of steroid and  anesthetic. I tried several different positions and the procedure was simply far too painful for the patient. He and I decided to forego the injection at that point.  FLUOROSCOPY TIME:  1.7 min.  184.7 mGy.  IMPRESSION: Technically unsuccessful right L4selective nerve root block. See above discussion.   Electronically Signed   By: Nelson Chimes M.D.   On: 07/29/2014 10:54   Dg Chest Port 1 View  07/31/2014   CLINICAL DATA:  Cough, preop  EXAM: PORTABLE CHEST - 1 VIEW  COMPARISON:  None.  FINDINGS: Lungs are clear.  No pleural effusion or pneumothorax.  The heart is normal in size.  IMPRESSION: No evidence of acute cardiopulmonary disease.   Electronically Signed   By: Julian Hy M.D.   On: 07/31/2014 12:31   Dg Hip Unilat With Pelvis 1v Right  07/22/2014   CLINICAL DATA:  Patient fell yesterday. Severe right hip pain. Altered mental status.  EXAM: RIGHT HIP (WITH PELVIS) 1 VIEW  COMPARISON:  None.  FINDINGS: Pelvis and right hip appear intact. No acute displaced fractures identified. SI joints and symphysis pubis are not displaced. No focal bone lesion or bone destruction. Bone cortex and trabecular architecture appear intact. Calcified phleboliths.  IMPRESSION: No acute bony abnormalities.   Electronically Signed   By: Lucienne Capers M.D.   On: 07/22/2014 22:47    CBC  Recent Labs Lab 07/27/14 0805 07/27/14 1647 07/30/14 0534 07/31/14 0610  WBC 17.2* 12.1* 13.3* 13.1*  HGB 16.6 15.0 15.3 14.9  HCT 48.1 45.0 45.2 44.8  PLT 309 302 259 255  MCV 90.8 90.0 90.2 91.6  MCH 31.3 30.0 30.5 30.5  MCHC 34.5 33.3 33.8 33.3  RDW 13.4 13.4 13.3 13.5  LYMPHSABS 3.3  --   --   --   MONOABS 1.9*  --   --   --  EOSABS 0.1  --   --   --   BASOSABS 0.0  --   --   --     Chemistries   Recent Labs Lab 07/27/14 0805 07/27/14 1647 07/31/14 0903  NA 138  --  135  K 3.8  --  4.1  CL 107  --  102  CO2 22  --  23  GLUCOSE 165*  --  188*  BUN 19  --  8  CREATININE 0.86 0.82 0.70  CALCIUM  9.8  --  9.5  AST 28  --   --   ALT 40  --   --   ALKPHOS 77  --   --   BILITOT 0.7  --   --    ------------------------------------------------------------------------------------------------------------------ estimated creatinine clearance is 108.5 mL/min (by C-G formula based on Cr of 0.7). ------------------------------------------------------------------------------------------------------------------ No results for input(s): HGBA1C in the last 72 hours. ------------------------------------------------------------------------------------------------------------------ No results for input(s): CHOL, HDL, LDLCALC, TRIG, CHOLHDL, LDLDIRECT in the last 72 hours. ------------------------------------------------------------------------------------------------------------------ No results for input(s): TSH, T4TOTAL, T3FREE, THYROIDAB in the last 72 hours.  Invalid input(s): FREET3 ------------------------------------------------------------------------------------------------------------------ No results for input(s): VITAMINB12, FOLATE, FERRITIN, TIBC, IRON, RETICCTPCT in the last 72 hours.  Coagulation profile  Recent Labs Lab 07/31/14 1455  INR 1.03    No results for input(s): DDIMER in the last 72 hours.  Cardiac Enzymes No results for input(s): CKMB, TROPONINI, MYOGLOBIN in the last 168 hours.  Invalid input(s): CK ------------------------------------------------------------------------------------------------------------------ Invalid input(s): POCBNP    Thurnell Lose M.D on 08/01/2014 at 9:37 AM  Between 7am to 7pm - Pager - [phone], After 7pm go to [email or web address] - password [facility]  Office  [phone]

## 2014-08-01 NOTE — Progress Notes (Signed)
Patient arrived to unit from PACU. Safety precautions and orders reviewed with patient. Patient appears in no distress, denied pain at this time. Patient expressed concerns that he hasn't had a BM since the 30th of last month (07/17/14) and would like to brush his teeth. He hasn't had a bath for 2 weeks now. RN at bedside attempting to accommodate his needs. Patient verbalizes that he will let me know further needs once arise but for now, he just wanted to brush his teeth. Will continue to monitor.    Zahari Fazzino, RN.

## 2014-08-02 LAB — GLUCOSE, CAPILLARY
Glucose-Capillary: 177 mg/dL — ABNORMAL HIGH (ref 70–99)
Glucose-Capillary: 155 mg/dL — ABNORMAL HIGH (ref 70–99)

## 2014-08-02 LAB — CBC
HCT: 47 % (ref 39.0–52.0)
Hemoglobin: 15.6 g/dL (ref 13.0–17.0)
MCH: 30.6 pg (ref 26.0–34.0)
MCHC: 33.2 g/dL (ref 30.0–36.0)
MCV: 92.2 fL (ref 78.0–100.0)
Platelets: 305 10*3/uL (ref 150–400)
RBC: 5.1 MIL/uL (ref 4.22–5.81)
RDW: 13.2 % (ref 11.5–15.5)
WBC: 13.6 10*3/uL — ABNORMAL HIGH (ref 4.0–10.5)

## 2014-08-02 LAB — BASIC METABOLIC PANEL
Anion gap: 9 (ref 5–15)
BUN: 12 mg/dL (ref 6–23)
CO2: 24 mmol/L (ref 19–32)
Calcium: 10.5 mg/dL (ref 8.4–10.5)
Chloride: 104 mmol/L (ref 96–112)
Creatinine, Ser: 0.94 mg/dL (ref 0.50–1.35)
GFR calc Af Amer: >90 mL/min (ref >90)
GFR calc non Af Amer: 87 mL/min — ABNORMAL LOW (ref >90)
Glucose, Bld: 190 mg/dL — ABNORMAL HIGH (ref 70–99)
Potassium: 4.5 mmol/L (ref 3.5–5.1)
Sodium: 137 mmol/L (ref 135–145)

## 2014-08-02 MED ORDER — HYDROCODONE-ACETAMINOPHEN 5-300 MG PO TABS: 1.0000 | ORAL_TABLET | Freq: Four times a day (QID) | ORAL | Status: DC | PRN

## 2014-08-02 MED ORDER — POLYETHYLENE GLYCOL 3350 17 G PO PACK: 17.0000 g | PACK | Freq: Two times a day (BID) | ORAL | Status: DC

## 2014-08-02 NOTE — Progress Notes (Signed)
Really doing great postop. Has no leg pain whatsoever. His back is appropriately sore. No numbness or tingling or weakness. He has walked well. He is urinating. He is ready for discharge home. I will see him back in the office in 2 weeks. He is very pleased.

## 2014-08-02 NOTE — Discharge Summary (Signed)
Philip [name], is a 64 y.o. male  DOB [birth year]  MRN [id number].  Admission date:  07/27/2014  Admitting Physician  Erline Hau, MD  Discharge Date:  08/02/2014   Primary MD  [name], MD  Recommendations for primary care physician for things to follow:   Repeat CBC, BMP in a week.   Admission Diagnosis  Sciatica neuralgia, right [M54.31]   Discharge Diagnosis  Sciatica neuralgia, right [M54.31]    Principal Problem:   Sciatica Active Problems:   Hypertension   Sciatica neuralgia   S/P lumbar microdiscectomy      Past Medical History  Diagnosis Date  . Chest pain   . Diabetes mellitus without complication   . Hypertension   . Hyperlipidemia   . PTSD (post-traumatic stress disorder)     Past Surgical History  Procedure Laterality Date  . Tonsillectomy    . Back surgery  1999    hernitated and ruptured discs  . Tonsillectomy         History of present illness and  Hospital Course:     Kindly see H&P for history of present illness and admission details, please review complete Labs, Consult reports and Test reports for all details in brief  HPI  from the history and physical done on the day of admission  Patient is a 48 year [name] man well-known to me for recent hospitalization for the same complaints. He has a history of hypertension and diabetes. During prior hospitalization he had an MRI of his lumbar spine that showed an annular fissure and small right paracentral disc protrusion at L2-3 which may affect the right L3 nerve root in the lateral recess. There is also a shallow right foraminal disc protrusion at L4-5 possibly irritating the right L4 nerve root. There was also a left foraminal disc osteophyte complex with left foraminal narrowing which could affect the left L5  nerve root. Above findings were discussed via telephone with neurosurgeon, Dr. Saintclair Halsted, who recommended a dose of IV steroids followed by an oral prednisone taper and discharge from the hospital to follow-up with him in 2 days. Patient did not allow for [place] to make appointment for him. He was discharged. He returns to the emergency department today saying that ever since the IV pain medication that he received on day of discharge wore off he has been in intractable pain, again unable to get off the floor and has been crawling on the floor ever since. EDP requested admission for pain control. I requested that he contact neurosurgeon in [place] for recommendations on how to proceed. He relates that after speaking with Dr. Sherley Bounds he has recommended transfer to [facility] where he will see patient in consultation and consider possibility of a local anesthetic block for pain relief.  Interim history Patient admitted for back pain. Neurosugery consulted and recommended nerve block by IR, however, procedure unable to be completed due to inflammation and patient pain. Pending further recommendations from neurosurgery. Patient continues to need IV pain medications. Physical therapy  consulted and all going. Underwent surgical correction by Dr. Ronnald Ramp on 08/01/2014.   Hospital Course   Sciatica/Lower back pain -Radiation of pain down the right leg -MRI on 07/23/2014: Annular fissure and small right paracentral disc protrusion L2-3, mass effect right L3 nerve root -Patient has received 2 courses of prednisone taper, IR failed to provide nerve block due to location of patient's pain and nerve impingement. -Neurosurgery consulted and he underwent Right L4-5 extraforaminal microdiscectomy - by Dr. Ronnald Ramp neurosurgeon on 08/01/2014. This morning his pain has almost completely resolved, his incision site looks clean, case discussed with Dr. Daneen Schick is cleared for home discharge with follow-up with PCP in neurosurgery. He  has no weakness or no subjective complaints this morning.    Hypertension -Stable, Continue on regimen unchanged.    Diabetes mellitus, type II with neuropathy -Continue home regimen. Request PCP to monitor glycemic control closely   Recent Labs    Lab Results  Component Value Date   HGBA1C 8.0* 07/24/2014      CBG (last 3)   Recent Labs (last 2 labs)      Recent Labs  07/31/14 1625 07/31/14 2153 08/01/14 0622  GLUCAP 179* 144* 147*       Depression anxiety -Continue Celexa   Leukocytosis Has been afebrile, check chest x-ray and UA. UA culture was a bad specimen UA itself was unremarkable. No source of infection. Likely mild leukocytosis due to recent steroid use. Request PCP to check CBC in a week.           Discharge Condition: Stable   Follow UP  Follow-up Information    Follow up with JONES,DAVID S, MD. Schedule an appointment as soon as possible for a visit in 2 weeks.   Specialty:  Neurosurgery   Contact information:   [address] [place] [address] [place] [address] [phone]       Follow up with [name], MD. Schedule an appointment as soon as possible for a visit in 1 week.   Specialty:  Family Medicine   Contact information:   [place] [address] [phone]       Follow up with Eustace Moore, MD. Schedule an appointment as soon as possible for a visit in 2 weeks.   Specialty:  Neurosurgery   Contact information:   [address] [place] [address] [place] [address] [phone]         Discharge Instructions  and  Discharge Medications      Discharge Instructions    Discharge instructions    Complete by:  As directed   Follow with Primary MD [name], MD in 7 days   Get CBC, CMP, 2 view Chest X ray checked  by Primary MD next visit.    Activity: As tolerated with Full fall precautions use walker/cane & assistance as needed.   Follow instructions given by  neurosurgeon Dr. Ronnald Ramp.   Disposition Home     Diet: Heart Healthy Low Carb  For Heart failure patients - Check your Weight same time everyday, if you gain over 2 pounds, or you develop in leg swelling, experience more shortness of breath or chest pain, call your Primary MD immediately. Follow Cardiac Low Salt Diet and 1.5 lit/day fluid restriction.   On your next visit with your primary care physician please Get Medicines reviewed and adjusted.   Please request your Prim.MD to go over all Hospital Tests and Procedure/Radiological results at the follow up, please get  all Hospital records sent to your Prim MD by signing hospital release before you go home.   If you experience worsening of your admission symptoms, develop shortness of breath, life threatening emergency, suicidal or homicidal thoughts you must seek medical attention immediately by calling 911 or calling your MD immediately  if symptoms less severe.  You Must read complete instructions/literature along with all the possible adverse reactions/side effects for all the Medicines you take and that have been prescribed to you. Take any new Medicines after you have completely understood and accpet all the possible adverse reactions/side effects.   Do not drive, operating heavy machinery, perform activities at heights, swimming or participation in water activities or provide baby sitting services if your were admitted for syncope or siezures until you have seen by Primary MD or a Neurologist and advised to do so again.  Do not drive when taking Pain medications.    Do not take more than prescribed Pain, Sleep and Anxiety Medications  Special Instructions: If you have smoked or chewed Tobacco  in the last 2 yrs please stop smoking, stop any regular Alcohol  and or any Recreational drug use.  Wear Seat belts while driving.   Please note  You were cared for by a hospitalist during your hospital stay. If you have any questions  about your discharge medications or the care you received while you were in the hospital after you are discharged, you can call the unit and asked to speak with the hospitalist on call if the hospitalist that took care of you is not available. Once you are discharged, your primary care physician will handle any further medical issues. Please note that NO REFILLS for any discharge medications will be authorized once you are discharged, as it is imperative that you return to your primary care physician (or establish a relationship with a primary care physician if you do not have one) for your aftercare needs so that they can reassess your need for medications and monitor your lab values.     Increase activity slowly    Complete by:  As directed             Medication List    STOP taking these medications        predniSONE 10 MG tablet  Commonly known as:  DELTASONE      TAKE these medications        amLODipine 10 MG tablet  Commonly known as:  NORVASC  Take 1 tablet (10 mg total) by mouth daily.     aspirin EC 81 MG tablet  Take 81 mg by mouth daily.     canagliflozin 100 MG Tabs tablet  Commonly known as:  INVOKANA  Take 1 tablet (100 mg total) by mouth daily.     citalopram 20 MG tablet  Commonly known as:  CELEXA  Take 1 tablet (20 mg total) by mouth daily.     ezetimibe 10 MG tablet  Commonly known as:  ZETIA  Take 1 tablet (10 mg total) by mouth daily.     gabapentin 300 MG capsule  Commonly known as:  NEURONTIN  Take 1 capsule (300 mg total) by mouth 3 (three) times daily.     Hydrocodone-Acetaminophen 5-300 MG Tabs  Take 1 tablet by mouth every 6 (six) hours as needed.     lisinopril 20 MG tablet  Commonly known as:  PRINIVIL,ZESTRIL  Take 1 tablet (20 mg total) by mouth daily.     metFORMIN 1000  MG tablet  Commonly known as:  GLUCOPHAGE  Take 1 tablet (1,000 mg total) by mouth 2 (two) times daily with a meal.     nitroGLYCERIN 0.4 MG SL tablet  Commonly  known as:  NITROSTAT  Place 0.4 mg under the tongue every 5 (five) minutes as needed for chest pain.     polyethylene glycol packet  Commonly known as:  MIRALAX / GLYCOLAX  Take 17 g by mouth 2 (two) times daily.          Diet and Activity recommendation: See Discharge Instructions above   Consults obtained - N surg   Major procedures and Radiology Reports - PLEASE review detailed and final reports for all details, in brief -     Right L4-5 extraforaminal microdiscectomy - by Dr. Ronnald Ramp neurosurgeon on 08/01/2014  Ct Head Wo Contrast  07/23/2014   CLINICAL DATA:  Right hip pain worsening today starting yesterday. Altered mental status. Flashback to [place]. No known injury.  EXAM: CT HEAD WITHOUT CONTRAST  TECHNIQUE: Contiguous axial images were obtained from the base of the skull through the vertex without intravenous contrast.  COMPARISON:  None.  FINDINGS: Ventricles and sulci appear symmetrical. No mass effect or midline shift. No abnormal extra-axial fluid collections. Gray-white matter junctions are distinct. Basal cisterns are not effaced. No evidence of acute intracranial hemorrhage. No depressed skull fractures. Mucosal thickening in the paranasal sinuses. Mastoid air cells are not opacified.  IMPRESSION: No acute intracranial abnormalities. Mucosal thickening in the paranasal sinuses.   Electronically Signed   By: Lucienne Capers M.D.   On: 07/23/2014 02:57   Mr Lumbar Spine Wo Contrast  07/23/2014   CLINICAL DATA:  Low back and left leg pain for 1 week. Previous history of lumbar surgery.  EXAM: MRI LUMBAR SPINE WITHOUT CONTRAST  TECHNIQUE: Multiplanar, multisequence MR imaging of the lumbar spine was performed. No intravenous contrast was administered.  COMPARISON:  None.  FINDINGS: Examination is limited due to motion degradation. Patient refused to finish the examination. No STIR sagittal images or postcontrast imaging was performed. Normal overall alignment of the lumbar  vertebral bodies. They demonstrate normal marrow signal. Multilevel Schmorl's nodes are noted. The facets are normally aligned. No pars defects. No significant paraspinal or retroperitoneal findings. Mild bladder distention is noted.  Axial images are limited.  L1-2: Mild bulging annulus asymmetric right with minimal impression on the thecal sac. No spinal or foraminal stenosis.  L2-3: Bulging degenerated annulus and mild osteophytic ridging with mild bilateral lateral recess encroachment. There is also a annular fissure and a small right paracentral disc protrusion with caudal down turning. There is mild mass effect on the right side of the thecal sac and this could affect the right L3 nerve root in the lateral recess. No spinal stenosis. No foraminal stenosis.  L3-4: Diffuse bulging annulus, osteophytic ridging and facet disease contribute to mild bilateral lateral recess stenosis. No significant spinal or foraminal stenosis.  L4-5: Mild bulging annulus and moderate facet disease with flattening of the ventral thecal sac and mild bilateral lateral recess encroachment. Shallow right foraminal disc protrusion appears to contact and slightly displace the right L4 nerve root.  L5-S1: Left-sided laminectomy defect noted. There is a diffuse bulging annulus but no spinal stenosis. There is a left foraminal disc osteophyte complex which narrows the neural foramen it may affect the left L5 nerve root.  IMPRESSION: 1. Degenerative lumbar spondylosis with multilevel disc disease and facet disease. 2. Limited examination by motion degradation. Patient also  refused the postcontrast imaging. 3. Annular fissure and small right paracentral disc protrusion and L2-3. This may affect the right L3 nerve root in the lateral recess. 4. Shallow right foraminal disc protrusion at L4-5 possibly irritating the right L4 nerve root. 5. Postoperative changes at L5-S1. There is a left foraminal disc osteophyte complex with left foraminal  narrowing which could affect the left L5 nerve root.   Electronically Signed   By: Marijo Sanes M.D.   On: 07/23/2014 13:38   Dg Lumbar Spine 1 View  08/01/2014   CLINICAL DATA:  L4-5 discectomy  EXAM: LUMBAR SPINE - 1 VIEW  COMPARISON:  07/23/2014  FINDINGS: Lateral views of the lumbar spine were obtained intraoperatively and show surgical retractors in instruments at the L4-5 interspace for discectomy. The numbering nomenclature is similar to that utilized on prior MRI examination.  IMPRESSION: Intraoperative localization for L4-5 discectomy.   Electronically Signed   By: Inez Catalina M.D.   On: 08/01/2014 11:16   Ir Fluoro Guide Ndl Plmt / Bx  07/29/2014   CLINICAL DATA:  Spondylosis without myelopathy. Severe right low back, hip and leg pain going all way to the foot. Foraminal disc on the right at L4-5 by MR.  EXAM: EXAM Attempted right L4 NERVE ROOT BLOCK WITH TRANSFORAMINAL EPIDURAL STEROID INJECTION. Procedure unsuccessful.  TECHNIQUE: Overlying skin prepped with Betadine, draped in the usual sterile fashion. Curved 22 gauge spinal needle directed into the right L4-5 neural foramen. I attempted to inject a few drops of Omnipaque 180. I could not achieve a needle position that would allow injection of diagnostic amount of contrast and certainly would not allow administration of 3-4 cc of steroid and anesthetic. I tried several different positions and the procedure was simply far too painful for the patient. He and I decided to forego the injection at that point.  FLUOROSCOPY TIME:  1.7 min.  184.7 mGy.  IMPRESSION: Technically unsuccessful right L4selective nerve root block. See above discussion.   Electronically Signed   By: Nelson Chimes M.D.   On: 07/29/2014 10:54   Dg Chest Port 1 View  07/31/2014   CLINICAL DATA:  Cough, preop  EXAM: PORTABLE CHEST - 1 VIEW  COMPARISON:  None.  FINDINGS: Lungs are clear.  No pleural effusion or pneumothorax.  The heart is normal in size.  IMPRESSION: No evidence  of acute cardiopulmonary disease.   Electronically Signed   By: Julian Hy M.D.   On: 07/31/2014 12:31   Dg Hip Unilat With Pelvis 1v Right  07/22/2014   CLINICAL DATA:  Patient fell yesterday. Severe right hip pain. Altered mental status.  EXAM: RIGHT HIP (WITH PELVIS) 1 VIEW  COMPARISON:  None.  FINDINGS: Pelvis and right hip appear intact. No acute displaced fractures identified. SI joints and symphysis pubis are not displaced. No focal bone lesion or bone destruction. Bone cortex and trabecular architecture appear intact. Calcified phleboliths.  IMPRESSION: No acute bony abnormalities.   Electronically Signed   By: Lucienne Capers M.D.   On: 07/22/2014 22:47    Micro Results      Recent Results (from the past 240 hour(s))  Culture, blood (routine x 2)     Status: None (Preliminary result)   Collection Time: 07/30/14 12:25 PM  Result Value Ref Range Status   Specimen Description BLOOD RIGHT ANTECUBITAL  Final   Special Requests BOTTLES DRAWN AEROBIC AND ANAEROBIC 10CC  Final   Culture   Final  BLOOD CULTURE RECEIVED NO GROWTH TO DATE CULTURE WILL BE HELD FOR 5 DAYS BEFORE ISSUING A FINAL NEGATIVE REPORT Performed at [facility]    Report Status PENDING  Incomplete  Culture, blood (routine x 2)     Status: None (Preliminary result)   Collection Time: 07/30/14 12:40 PM  Result Value Ref Range Status   Specimen Description BLOOD LEFT HAND  Final   Special Requests BOTTLES DRAWN AEROBIC ONLY 10CC  Final   Culture   Final           BLOOD CULTURE RECEIVED NO GROWTH TO DATE CULTURE WILL BE HELD FOR 5 DAYS BEFORE ISSUING A FINAL NEGATIVE REPORT Performed at [facility]    Report Status PENDING  Incomplete  Urine culture     Status: None   Collection Time: 07/31/14  3:50 PM  Result Value Ref Range Status   Specimen Description URINE, CATHETERIZED  Final   Special Requests NONE  Final   Colony Count   Final    >=100,000 COLONIES/ML Performed at [facility]    Culture   Final    Multiple bacterial morphotypes present, none predominant. Suggest appropriate recollection if clinically indicated. Performed at [facility]    Report Status 08/01/2014 FINAL  Final  Surgical pcr screen     Status: Abnormal   Collection Time: 08/01/14  9:01 AM  Result Value Ref Range Status   MRSA, PCR NEGATIVE NEGATIVE Final   Staphylococcus aureus POSITIVE (A) NEGATIVE Final    Comment:        The Xpert SA Assay (FDA approved for NASAL specimens in patients over 1 years of age), is one component of a comprehensive surveillance program.  Test performance has been validated by [facility] for patients greater than or equal to 44 year [name]. It is not intended to diagnose infection nor to guide or monitor treatment.        Today   Subjective:   Daud Cayer today has no headache,no chest abdominal pain,no new weakness tingling or numbness, feels much better wants to go home today.    Objective:   Blood pressure 143/90, pulse 99, temperature 98.9 F (37.2 C), temperature source Oral, resp. rate 20, height 5' 8.5" (1.74 m), weight 98.612 kg (217 lb 6.4 oz), SpO2 100 %.   Intake/Output Summary (Last 24 hours) at 08/02/14 1014 Last data filed at 08/02/14 0646  Gross per 24 hour  Intake 1651.25 ml  Output   4000 ml  Net -2348.75 ml    Exam Awake Alert, Oriented x 3, No new F.N deficits, Normal affect [place].AT,PERRAL Supple Neck,No JVD, No cervical lymphadenopathy appriciated.  Symmetrical Chest wall movement, Good air movement bilaterally, CTAB RRR,No Gallops,Rubs or new Murmurs, No Parasternal Heave +ve B.Sounds, Abd Soft, Non tender, No organomegaly appriciated, No rebound -guarding or rigidity. No Cyanosis, Clubbing or edema, No new Rash or bruise, L-spine incision site clean  Data Review   CBC w Diff: Lab Results  Component Value Date   WBC 13.1* 07/31/2014   WBC 9.3 07/23/2013   HGB 14.9 07/31/2014   HGB 14.6  07/23/2013   HCT 44.8 07/31/2014   HCT 45.4 07/23/2013   PLT 255 07/31/2014   LYMPHOPCT 19 07/27/2014   MONOPCT 11 07/27/2014   EOSPCT 0 07/27/2014   BASOPCT 0 07/27/2014    CMP: Lab Results  Component Value Date   NA 135 07/31/2014   NA 139 01/17/2014   K 4.1 07/31/2014  CL 102 07/31/2014   CO2 23 07/31/2014   BUN 8 07/31/2014   BUN 15 01/17/2014   CREATININE 0.70 07/31/2014   CREATININE 0.83 10/30/2012   PROT 6.7 07/27/2014   PROT 6.5 01/17/2014   ALBUMIN 4.0 07/27/2014   BILITOT 0.7 07/27/2014   ALKPHOS 77 07/27/2014   AST 28 07/27/2014   ALT 40 07/27/2014  .   Total Time in preparing paper work, data evaluation and todays exam - 35 minutes  Thurnell Lose M.D on 08/02/2014 at 10:14 AM  [facility]   Office  [phone]

## 2014-08-02 NOTE — Progress Notes (Signed)
Talked to patient about home health choices, patient does not want [facility] at this time; CM informed patient that if he changed his mind about wanting [facility] to notify his PCP for arrangements; Mindi Slicker RN,BSN,MHA [phone]

## 2014-08-02 NOTE — Progress Notes (Signed)
Physical Therapy Treatment Patient Details Name: Philip [name] MRN: [id number] DOB: Apr 11, 1951 Today's Date: 08/02/2014    History of Present Illness Patient is a 63 y/o male admitted with intractable back pain.  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  Pt s/p R L4-5 extraformainal microdiscectomy on 08/01/14.    PT Comments    Improved gait mechanics, although one instance of Rt knee buckling slightly, able to self correct. Safely completed stair training without physical assist. States he does not have any further questions or concerns regarding safe mobility. Adequate for d/c from PT standpoint.   Follow Up Recommendations  Home health PT;Supervision - Intermittent     Equipment Recommendations  None recommended by PT    Recommendations for Other Services       Precautions / Restrictions Precautions Precautions: Back;Fall Precaution Comments: Pt able to state 3/3 back precautions  Restrictions Weight Bearing Restrictions: No    Mobility  Bed Mobility Overal bed mobility: Needs Assistance Bed Mobility: Rolling;Sidelying to Sit Rolling: Supervision;Independent Sidelying to sit: Independent       General bed mobility comments: sitting edge of bed when PT entered room  Transfers Overall transfer level: Modified independent               General transfer comment: did not require cues or assist to safely stand  Ambulation/Gait Ambulation/Gait assistance: Supervision Ambulation Distance (Feet): 180 Feet Assistive device: None Gait Pattern/deviations: Step-through pattern;Narrow base of support   Gait velocity interpretation: at or above normal speed for age/gender General Gait Details: Had one episode of Rt knee buckling early in therapy session however was able  to self correct. Was noted noted again during rest of ambulatory bout. Educated on awareness of safety and fatigue.   Stairs Stairs: Yes Stairs assistance: Supervision Stair Management: No rails;Step to pattern;Forwards Number of Stairs: 12 General stair comments: Educated on safe stair navigation technique without use of rail similar to home environment. Instructed to take his time and use a step-to pattern for safety, leading with Lt foot to ascend and Rt foot while descending. Did not require physical assist and states he feels confident with this task.  Wheelchair Mobility    Modified Rankin (Stroke Patients Only)       Balance     Sitting balance-Leahy Scale: Good       Standing balance-Leahy Scale: Good                      Cognition Arousal/Alertness: Awake/alert Behavior During Therapy: WFL for tasks assessed/performed Overall Cognitive Status: Within Functional Limits for tasks assessed                      Exercises      General Comments General comments (skin integrity, edema, etc.): OT allowed PT to formally train patient on stairs and to perform final gait assessement and training during OT eval as they had to ambulate to gym for ADL training.      Pertinent Vitals/Pain Pain Assessment: Faces Faces Pain Scale: No hurt Pain Intervention(s): Monitored during session    Home Living Family/patient expects to be discharged to:: Private residence Living Arrangements: Alone Available Help at Discharge: Friend(s);Available PRN/intermittently Type of Home: Mobile home Home Access: Stairs to enter Entrance Stairs-Rails: Right Home Layout: One level Home Equipment: Gilford Rile -  2 wheels Additional Comments: States sleeps on pallet on the floor due to unable to find bed that supports well enough.  Has been crawling around about 2 weeks due to pain; reluctant to ask friends for help, but will get more help as opposed to going to SNF    Prior  Function Level of Independence: Independent          PT Goals (current goals can now be found in the care plan section) Acute Rehab PT Goals Patient Stated Goal: to go home  PT Goal Formulation: With patient Time For Goal Achievement: 08/06/14 Potential to Achieve Goals: Good Progress towards PT goals: Progressing toward goals    Frequency  Min 3X/week    PT Plan Current plan remains appropriate    Co-evaluation             End of Session   Activity Tolerance: Patient tolerated treatment well Patient left:  (With OT in gym for ADL training)     Time: 2122-4825 PT Time Calculation (min) (ACUTE ONLY): 8 min  Charges:  $Gait Training: 8-22 mins                    G Codes:      Ellouise Newer 2014/08/06, 2:10 PM Camille [place], [place]

## 2014-08-02 NOTE — Progress Notes (Signed)
Pt discharged at this time taking all personal belongings. IV discontinued, dry dressing applied. Discharge instructions provided along with prescriptions. He insisted on scheduling his own appointments stating, " I don't need you to schedule an appointment for me."  This nurse proceeded with discharge education.  Pt was made aware that he could take his prescriptions to his pharmacy to fill and after viewing the prescriptions pt stated, " I told doctor Ronnald Ramp that this pain medicine doesn't work for me, he said he was ordering me  a muscle relaxant!" Pt started then yelling at this nurse stating, " You are getting on my nerves!"  He slammed his hand on the table and threw his paperwork at this nurse requesting to speak with DON.  The IV site to his left hand dressing started to bleed  through drops noted to the floor so this nurse attempted to redress with another gauze and tape and pt snatched the gauze  out of this nurse hand and put it over his hand stated, "Dont you dare touch me, you just go get your DON, I told you earlier to go get her and you still haven't, just get out." This nurse exited pts room to go get DON and ADON  to speak with pt before discharge.

## 2014-08-02 NOTE — Evaluation (Signed)
Occupational Therapy Evaluation Patient Details Name: Dilyn Smiles MRN: [id number] DOB: 07/17/1950 Today's Date: 08/02/2014    History of Present Illness Patient is a 64 y/o male admitted with intractable back pain.  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  Pt s/p R L4-5 extraformainal microdiscectomy on 08/01/14.   Clinical Impression   Patient evaluated by Occupational Therapy with no further acute OT needs identified. All education has been completed and the patient has no further questions. Pt is modified independence with ADLs.  All education completed.  See below for any follow-up Occupational Therapy or equipment needs. OT is signing off. Thank you for this referral.      Follow Up Recommendations  No OT follow up    Equipment Recommendations  None recommended by OT    Recommendations for Other Services       Precautions / Restrictions Precautions Precautions: Back;Fall Precaution Comments: Pt able to state 3/3 back precautions       Mobility Bed Mobility Overal bed mobility: Needs Assistance Bed Mobility: Rolling;Sidelying to Sit Rolling: Supervision;Independent Sidelying to sit: Independent          Transfers Overall transfer level: Modified independent                    Balance     Sitting balance-Leahy Scale: Good       Standing balance-Leahy Scale: Good                              ADL Overall ADL's : Modified independent                                       General ADL Comments: Reviewed safe technique for LB bathing and dressing, tub transfers, toilet transfer and grooming.  Pt verbalized understanding of all.  Advised pt to avoid donning pants in standing, he agreed and stated he would perform supine       Vision     Perception     Praxis      Pertinent Vitals/Pain Pain Assessment: Faces Faces Pain Scale: No hurt     Hand Dominance Right   Extremity/Trunk Assessment Upper Extremity Assessment Upper Extremity Assessment: Overall WFL for tasks assessed   Lower Extremity Assessment Lower Extremity Assessment: Defer to PT evaluation       Communication Communication Communication: No difficulties   Cognition Arousal/Alertness: Awake/alert Behavior During Therapy: WFL for tasks assessed/performed Overall Cognitive Status: Within Functional Limits for tasks assessed                     General Comments       Exercises       Shoulder Instructions      Home Living Family/patient expects to be discharged to:: Private residence Living Arrangements: Alone Available Help at Discharge: Friend(s);Available PRN/intermittently Type of Home: Mobile home Home Access: Stairs to enter Entrance Stairs-Number of Steps: 12 Entrance Stairs-Rails: Right Home Layout: One level     Bathroom Shower/Tub: Tub/shower unit;Curtain Shower/tub characteristics: Architectural technologist: Standard     Home Equipment: Environmental consultant - 2 wheels   Additional Comments: States sleeps on pallet on the floor due to unable to  find bed that supports well enough.  Has been crawling around about 2 weeks due to pain; reluctant to ask friends for help, but will get more help as opposed to going to SNF      Prior Functioning/Environment Level of Independence: Independent             OT Diagnosis:     OT Problem List:     OT Treatment/Interventions:      OT Goals(Current goals can be found in the care plan section) Acute Rehab OT Goals Patient Stated Goal: to go home  OT Goal Formulation: All assessment and education complete, DC therapy  OT Frequency:     Barriers to D/C:            Co-evaluation              End of Session Nurse Communication: Mobility status  Activity  Tolerance: Patient tolerated treatment well Patient left: in bed;with call bell/phone within reach   Time: 1026-1055 OT Time Calculation (min): 29 min Charges:  OT General Charges $OT Visit: 1 Procedure OT Evaluation $Initial OT Evaluation Tier I: 1 Procedure G-Codes:    Lucille Passy M Aug 12, 2014, 12:07 PM

## 2014-08-02 NOTE — Progress Notes (Signed)
Pt ambulated 200 ft with rolling walker in the hall way with standby assist. He has been signed off by nursing as independent. Gait steady. He denied pain or discomfort. He has voided since foley was removed this am. He refused his Miralax and Senna, has not had bowel activity this am. Verbally aggressive towards this nurse during medication pass. Spoke with MD, pt is ready for discharge at this time. No noted distress.

## 2014-08-02 NOTE — Discharge Instructions (Signed)
Follow with Primary MD [name], MD in 7 days   Get CBC, CMP, 2 view Chest X ray checked  by Primary MD next visit.    Activity: As tolerated with Full fall precautions use walker/cane & assistance as needed.   Follow instructions given by neurosurgeon Dr. Ronnald Ramp.   Disposition Home     Diet: Heart Healthy Low Carb  For Heart failure patients - Check your Weight same time everyday, if you gain over 2 pounds, or you develop in leg swelling, experience more shortness of breath or chest pain, call your Primary MD immediately. Follow Cardiac Low Salt Diet and 1.5 lit/day fluid restriction.   On your next visit with your primary care physician please Get Medicines reviewed and adjusted.   Please request your Prim.MD to go over all Hospital Tests and Procedure/Radiological results at the follow up, please get all Hospital records sent to your Prim MD by signing hospital release before you go home.   If you experience worsening of your admission symptoms, develop shortness of breath, life threatening emergency, suicidal or homicidal thoughts you must seek medical attention immediately by calling 911 or calling your MD immediately  if symptoms less severe.  You Must read complete instructions/literature along with all the possible adverse reactions/side effects for all the Medicines you take and that have been prescribed to you. Take any new Medicines after you have completely understood and accpet all the possible adverse reactions/side effects.   Do not drive, operating heavy machinery, perform activities at heights, swimming or participation in water activities or provide baby sitting services if your were admitted for syncope or siezures until you have seen by Primary MD or a Neurologist and advised to do so again.  Do not drive when taking Pain medications.    Do not take more than prescribed Pain, Sleep and Anxiety Medications  Special Instructions: If you have smoked or  chewed Tobacco  in the last 2 yrs please stop smoking, stop any regular Alcohol  and or any Recreational drug use.  Wear Seat belts while driving.   Please note  You were cared for by a hospitalist during your hospital stay. If you have any questions about your discharge medications or the care you received while you were in the hospital after you are discharged, you can call the unit and asked to speak with the hospitalist on call if the hospitalist that took care of you is not available. Once you are discharged, your primary care physician will handle any further medical issues. Please note that NO REFILLS for any discharge medications will be authorized once you are discharged, as it is imperative that you return to your primary care physician (or establish a relationship with a primary care physician if you do not have one) for your aftercare needs so that they can reassess your need for medications and monitor your lab values.

## 2014-08-03 ENCOUNTER — Encounter (HOSPITAL_COMMUNITY): Payer: Self-pay | Admitting: Neurological Surgery

## 2014-08-05 LAB — CULTURE, BLOOD (ROUTINE X 2)
Culture: NO GROWTH
Culture: NO GROWTH

## 2014-08-07 ENCOUNTER — Telehealth: Payer: Self-pay | Admitting: Family Medicine

## 2014-08-07 NOTE — Telephone Encounter (Signed)
Appointment given for Tuesday 4/26 with Sabra Heck.

## 2014-08-13 ENCOUNTER — Ambulatory Visit (INDEPENDENT_AMBULATORY_CARE_PROVIDER_SITE_OTHER): Payer: BLUE CROSS/BLUE SHIELD | Admitting: Family Medicine

## 2014-08-13 ENCOUNTER — Encounter: Payer: Self-pay | Admitting: Family Medicine

## 2014-08-13 VITALS — BP 144/81 | HR 114 | Temp 98.5°F | Ht 68.5 in | Wt 207.0 lb

## 2014-08-13 DIAGNOSIS — R42 Dizziness and giddiness: Secondary | ICD-10-CM

## 2014-08-13 DIAGNOSIS — R432 Parageusia: Secondary | ICD-10-CM

## 2014-08-13 DIAGNOSIS — R253 Fasciculation: Secondary | ICD-10-CM

## 2014-08-13 DIAGNOSIS — R258 Other abnormal involuntary movements: Secondary | ICD-10-CM | POA: Diagnosis not present

## 2014-08-13 MED ORDER — MECLIZINE HCL 25 MG PO TABS: 25.0000 mg | ORAL_TABLET | Freq: Three times a day (TID) | ORAL | Status: DC | PRN

## 2014-08-13 NOTE — Progress Notes (Signed)
Subjective:    Patient ID: Philip [name], male    DOB: Aug 26, 1950, 64 y.o.   MRN: [id number]  HPI  64 year old gentleman who recently had disc surgery. He had been seen here for back pain and has experienced initial relief with prednisone and oxycodone only to have pain recur. He was hospitalized at [facility] without relief and then subsequently transferred to come where he underwent surgery about 2 weeks ago. Since then he has not had pain but he has had the following symptoms:  He complains of some change with tastes especially dairy products and which milk seems hours and ask taste rotten. He may have had some medication around the time of surgery which has affected his taste but I would be inclined to observe for now. I have seen changes with dysgeusia with ACE inhibitor.  He also has some vertigo is worse with quick movements. He has no vomiting but has some what he describes as "roller coaster stomach." There are no ear symptoms and his blood pressure has been good  Finally he complains of some twitching on the lateral aspect of his right leg. There is no pain.    Review of Systems  Constitutional: Negative.   Respiratory: Negative.   Cardiovascular: Negative.   Gastrointestinal: Negative.   Musculoskeletal: Negative.   Neurological: Positive for tremors.  Psychiatric/Behavioral: Negative.    Patient Active Problem List   Diagnosis Date Noted  . S/P lumbar microdiscectomy 08/01/2014  . Sciatica neuralgia   . Leukocytosis 07/24/2014  . Sciatica 07/23/2014  . PTSD (post-traumatic stress disorder) 07/23/2014  . Altered awareness, transient 07/23/2014  . Hyperlipidemia   . Unspecified vitamin D deficiency 08/20/2013  . HLD (hyperlipidemia) 08/20/2013  . Hypercalcemia 06/19/2013  . Shortness of breath 10/30/2012  . Diabetes 10/30/2012  . Non compliance with medical treatment 10/30/2012  . Panic disorder 10/30/2012  . Chest pain 09/24/2010  . Hypertension 09/24/2010  .  Dyslipidemia 09/24/2010   Outpatient Encounter Prescriptions as of 08/13/2014  Medication Sig  . amLODipine (NORVASC) 10 MG tablet Take 1 tablet (10 mg total) by mouth daily.  Marland Kitchen aspirin EC 81 MG tablet Take 81 mg by mouth daily.  . canagliflozin (INVOKANA) 100 MG TABS tablet Take 1 tablet (100 mg total) by mouth daily.  . citalopram (CELEXA) 20 MG tablet Take 1 tablet (20 mg total) by mouth daily.  . metFORMIN (GLUCOPHAGE) 1000 MG tablet Take 1 tablet (1,000 mg total) by mouth 2 (two) times daily with a meal.  . nitroGLYCERIN (NITROSTAT) 0.4 MG SL tablet Place 0.4 mg under the tongue every 5 (five) minutes as needed for chest pain.   . [DISCONTINUED] ezetimibe (ZETIA) 10 MG tablet Take 1 tablet (10 mg total) by mouth daily. (Patient not taking: Reported on 07/27/2014)  . [DISCONTINUED] gabapentin (NEURONTIN) 300 MG capsule Take 1 capsule (300 mg total) by mouth 3 (three) times daily.  . [DISCONTINUED] Hydrocodone-Acetaminophen 5-300 MG TABS Take 1 tablet by mouth every 6 (six) hours as needed.  . [DISCONTINUED] lisinopril (PRINIVIL,ZESTRIL) 20 MG tablet Take 1 tablet (20 mg total) by mouth daily.  . [DISCONTINUED] polyethylene glycol (MIRALAX / GLYCOLAX) packet Take 17 g by mouth 2 (two) times daily.      Objective:   Physical Exam  Constitutional: He appears well-developed and well-nourished.  Cardiovascular: Normal rate and regular rhythm.   Pulmonary/Chest: Effort normal.  Neurological:  There is no nystagmus. Reflexes are somewhat depressed of the right ankle jerk.  Skin:  There is some  dehiscence of his surgical wound but there is no drainage or erythema  Psychiatric: He has a normal mood and affect. His behavior is normal.          Assessment & Plan:  1. Dizziness and giddiness Will provide Rx for Antivert to take as needed for dizziness. Uncertain of etiology  2. Muscle twitch I suspect this may be related to nerve injury and/or regeneration after spinal surgery. I have  suggested he mentioned it to the neurosurgeon  3. Dysgeusia No obvious etiology. Would like to blame on some medicine he may have gotten around the time of surgery but would also consider ACE inhibitor as a calls  [name] MD

## 2014-09-27 ENCOUNTER — Other Ambulatory Visit: Payer: Self-pay | Admitting: Family Medicine

## 2014-10-25 ENCOUNTER — Encounter (INDEPENDENT_AMBULATORY_CARE_PROVIDER_SITE_OTHER): Payer: Self-pay

## 2014-10-25 ENCOUNTER — Encounter: Payer: Self-pay | Admitting: Physician Assistant

## 2014-10-25 ENCOUNTER — Ambulatory Visit (INDEPENDENT_AMBULATORY_CARE_PROVIDER_SITE_OTHER): Payer: BLUE CROSS/BLUE SHIELD | Admitting: Physician Assistant

## 2014-10-25 VITALS — BP 150/89 | HR 95 | Temp 97.7°F | Ht 68.5 in | Wt 224.2 lb

## 2014-10-25 DIAGNOSIS — J0100 Acute maxillary sinusitis, unspecified: Secondary | ICD-10-CM

## 2014-10-25 MED ORDER — AMOXICILLIN-POT CLAVULANATE 875-125 MG PO TABS: 1.0000 | ORAL_TABLET | Freq: Two times a day (BID) | ORAL | Status: DC

## 2014-10-25 MED ORDER — ALBUTEROL SULFATE HFA 108 (90 BASE) MCG/ACT IN AERS: 2.0000 | INHALATION_SPRAY | Freq: Four times a day (QID) | RESPIRATORY_TRACT | Status: DC | PRN

## 2014-10-25 NOTE — Progress Notes (Signed)
Subjective:     Patient ID: Philip [name], male   DOB: [birth year], 64 y.o.   MRN: [id number]  HPI Pt with pain and fullness to the R ear Also with S/T and dry hacky cough Some sinus pain and pressure tot he same side Has used OTC meds with no relief  Review of Systems  Constitutional: Negative.   HENT: Positive for congestion, ear pain, postnasal drip, sinus pressure, sore throat and trouble swallowing.   Respiratory: Positive for cough.   Cardiovascular: Negative.        Objective:   Physical Exam  Constitutional: He appears well-developed and well-nourished.  HENT:  Right Ear: External ear normal.  Left Ear: External ear normal.  Mouth/Throat: Oropharynx is clear and moist. No oropharyngeal exudate.  R maxillary sinus TTP  Neck: Neck supple. No JVD present.  Cardiovascular: Normal rate, regular rhythm and normal heart sounds.   Pulmonary/Chest: Effort normal and breath sounds normal.  Tight with cough  Lymphadenopathy:    He has no cervical adenopathy.  Nursing note and vitals reviewed.      Assessment:     1. Acute maxillary sinusitis, recurrence not specified        Plan:     Augmentin bid x 10 days Alb Inh for cough OTC Claritin for PND Fluids Rest F/U prn

## 2014-10-25 NOTE — Patient Instructions (Signed)

## 2014-10-31 ENCOUNTER — Other Ambulatory Visit: Payer: Self-pay | Admitting: Family Medicine

## 2014-11-01 NOTE — Telephone Encounter (Signed)
RX's called into pharmacy

## 2014-12-03 ENCOUNTER — Other Ambulatory Visit: Payer: Self-pay | Admitting: Family Medicine

## 2014-12-11 ENCOUNTER — Encounter: Payer: Self-pay | Admitting: Family Medicine

## 2014-12-11 ENCOUNTER — Ambulatory Visit (INDEPENDENT_AMBULATORY_CARE_PROVIDER_SITE_OTHER): Payer: BLUE CROSS/BLUE SHIELD | Admitting: Family Medicine

## 2014-12-11 VITALS — BP 114/65 | HR 92 | Temp 97.9°F | Ht 68.5 in | Wt 221.2 lb

## 2014-12-11 DIAGNOSIS — E119 Type 2 diabetes mellitus without complications: Secondary | ICD-10-CM | POA: Diagnosis not present

## 2014-12-11 DIAGNOSIS — I10 Essential (primary) hypertension: Secondary | ICD-10-CM

## 2014-12-11 LAB — POCT GLYCOSYLATED HEMOGLOBIN (HGB A1C): Hemoglobin A1C: 7.5

## 2014-12-11 MED ORDER — ATORVASTATIN CALCIUM 20 MG PO TABS: ORAL_TABLET | ORAL | Status: DC

## 2014-12-11 NOTE — Addendum Note (Signed)
Addended by: Jamelle Haring on: 12/11/2014 11:30 AM   Modules accepted: Orders, SmartSet

## 2014-12-11 NOTE — Progress Notes (Signed)
Subjective:    Patient ID: Philip [name], male    DOB: August 15, 1950, 64 y.o.   MRN: [id number]  HPI  64 year old gentleman who is here to follow-up diabetes hypertension and lipids. Many of our visits before now and focused on his back pain and issues that he is pain-free now and doing better still is in active and says that his sugars will our reflection. His last A1c was 8.0. He uses metformin and lower dose Invokana. Weight fluctuates. I think he can do better on diet. He is certainly has insight and education to do better  We talked about lipids today. His LDL 4 months ago was 131 and per guidelines would like to get LDL under 100 area he is not opposed to starting a statin  Patient Active Problem List   Diagnosis Date Noted  . S/P lumbar microdiscectomy 08/01/2014  . Sciatica neuralgia   . Leukocytosis 07/24/2014  . Sciatica 07/23/2014  . PTSD (post-traumatic stress disorder) 07/23/2014  . Altered awareness, transient 07/23/2014  . Hyperlipidemia   . Unspecified vitamin D deficiency 08/20/2013  . HLD (hyperlipidemia) 08/20/2013  . Hypercalcemia 06/19/2013  . Shortness of breath 10/30/2012  . Diabetes 10/30/2012  . Non compliance with medical treatment 10/30/2012  . Panic disorder 10/30/2012  . Chest pain 09/24/2010  . Hypertension 09/24/2010  . Dyslipidemia 09/24/2010   Outpatient Encounter Prescriptions as of 12/11/2014  Medication Sig  . aspirin EC 81 MG tablet Take 81 mg by mouth daily.  . citalopram (CELEXA) 20 MG tablet TAKE ONE TABLET BY MOUTH ONCE DAILY  . INVOKANA 100 MG TABS tablet TAKE ONE TABLET BY MOUTH ONCE DAILY  . lisinopril (PRINIVIL,ZESTRIL) 20 MG tablet TAKE ONE TABLET BY MOUTH ONCE DAILY  . metFORMIN (GLUCOPHAGE) 1000 MG tablet Take 1 tablet (1,000 mg total) by mouth 2 (two) times daily with a meal.  . [DISCONTINUED] albuterol (PROVENTIL HFA;VENTOLIN HFA) 108 (90 BASE) MCG/ACT inhaler Inhale 2 puffs into the lungs every 6 (six) hours as needed for wheezing or  shortness of breath.  . [DISCONTINUED] canagliflozin (INVOKANA) 100 MG TABS tablet Take 1 tablet (100 mg total) by mouth daily.  Marland Kitchen amLODipine (NORVASC) 10 MG tablet Take 1 tablet (10 mg total) by mouth daily. (Patient not taking: Reported on 10/25/2014)  . nitroGLYCERIN (NITROSTAT) 0.4 MG SL tablet Place 0.4 mg under the tongue every 5 (five) minutes as needed for chest pain.   . [DISCONTINUED] amoxicillin-clavulanate (AUGMENTIN) 875-125 MG per tablet Take 1 tablet by mouth 2 (two) times daily.  . [DISCONTINUED] citalopram (CELEXA) 20 MG tablet Take 1 tablet (20 mg total) by mouth daily.   No facility-administered encounter medications on file as of 12/11/2014.      Review of Systems  Constitutional: Negative.   Respiratory: Negative.   Cardiovascular: Negative.   Endocrine: Negative.   Neurological: Negative.   Psychiatric/Behavioral: Negative.        Objective:   Physical Exam  Constitutional: He is oriented to person, place, and time. He appears well-developed and well-nourished.  Cardiovascular: Normal rate and regular rhythm.   Pulmonary/Chest: Effort normal.  Neurological: He is alert and oriented to person, place, and time.  Psychiatric: He has a normal mood and affect. His behavior is normal.          Assessment & Plan:  1. Essential hypertension Blood pressure is well controlled on only lisinopril. He had some issues obtaining amlodipine but appears he does not need  2. Type 2 diabetes mellitus without complication Result  range from 220 to about 90. He says A1c will not be much changed yet might also consider increasing Invokana  3 hyperlipidemia Begin atorvastatin 20 mg every other day and recheck lipids at next visit - POCT glycosylated hemoglobin (Hb A1C)

## 2015-02-14 ENCOUNTER — Telehealth: Payer: Self-pay | Admitting: Family Medicine

## 2015-02-14 ENCOUNTER — Other Ambulatory Visit: Payer: Self-pay | Admitting: Family Medicine

## 2015-02-14 NOTE — Telephone Encounter (Signed)
Done earlier today

## 2015-03-18 ENCOUNTER — Telehealth: Payer: Self-pay | Admitting: Family Medicine

## 2015-03-18 NOTE — Telephone Encounter (Signed)
denied °

## 2015-03-19 ENCOUNTER — Other Ambulatory Visit: Payer: Self-pay | Admitting: Family Medicine

## 2015-05-10 ENCOUNTER — Other Ambulatory Visit: Payer: Self-pay | Admitting: Family Medicine

## 2015-05-14 ENCOUNTER — Other Ambulatory Visit: Payer: Self-pay

## 2015-05-14 MED ORDER — CITALOPRAM HYDROBROMIDE 20 MG PO TABS: 20.0000 mg | ORAL_TABLET | Freq: Every day | ORAL | Status: DC

## 2015-05-14 NOTE — Telephone Encounter (Signed)
Last seen 12/11/14  Dr Sabra Heck  Requesting 90 day supply

## 2015-05-15 NOTE — Telephone Encounter (Signed)
Pt aware 90 day rx sent into pharmacy.

## 2015-05-21 ENCOUNTER — Telehealth: Payer: Self-pay | Admitting: Family Medicine

## 2015-06-17 ENCOUNTER — Other Ambulatory Visit: Payer: Self-pay | Admitting: Family Medicine

## 2015-06-18 NOTE — Telephone Encounter (Signed)
Last seen 12/11/14  Dr Sabra Heck

## 2015-07-22 ENCOUNTER — Other Ambulatory Visit: Payer: Self-pay | Admitting: Family Medicine

## 2015-07-23 NOTE — Telephone Encounter (Signed)
Last seen 12/11/14  Dr Sabra Heck

## 2015-09-06 ENCOUNTER — Other Ambulatory Visit: Payer: Self-pay | Admitting: Family Medicine

## 2015-09-08 NOTE — Telephone Encounter (Signed)
Last seen 12/11/14  Dr Sabra Heck

## 2015-09-12 ENCOUNTER — Other Ambulatory Visit: Payer: Self-pay | Admitting: Family Medicine

## 2015-09-12 NOTE — Telephone Encounter (Signed)
Last seen 11/2014

## 2015-09-26 ENCOUNTER — Ambulatory Visit (INDEPENDENT_AMBULATORY_CARE_PROVIDER_SITE_OTHER): Payer: BLUE CROSS/BLUE SHIELD | Admitting: Family Medicine

## 2015-09-26 ENCOUNTER — Encounter: Payer: Self-pay | Admitting: Family Medicine

## 2015-09-26 VITALS — BP 145/84 | HR 100 | Temp 97.8°F | Ht 68.5 in | Wt 228.0 lb

## 2015-09-26 DIAGNOSIS — I10 Essential (primary) hypertension: Secondary | ICD-10-CM | POA: Diagnosis not present

## 2015-09-26 DIAGNOSIS — E119 Type 2 diabetes mellitus without complications: Secondary | ICD-10-CM

## 2015-09-26 DIAGNOSIS — F410 Panic disorder [episodic paroxysmal anxiety] without agoraphobia: Secondary | ICD-10-CM | POA: Diagnosis not present

## 2015-09-26 LAB — BAYER DCA HB A1C WAIVED: HB A1C (BAYER DCA - WAIVED): 8.5 % — ABNORMAL HIGH (ref <7.0)

## 2015-09-26 MED ORDER — METFORMIN HCL 1000 MG PO TABS: 1000.0000 mg | ORAL_TABLET | Freq: Two times a day (BID) | ORAL | Status: DC

## 2015-09-26 MED ORDER — CITALOPRAM HYDROBROMIDE 20 MG PO TABS: 20.0000 mg | ORAL_TABLET | Freq: Every day | ORAL | Status: DC

## 2015-09-26 MED ORDER — CANAGLIFLOZIN 100 MG PO TABS: 100.0000 mg | ORAL_TABLET | Freq: Every day | ORAL | Status: DC

## 2015-09-26 MED ORDER — CITALOPRAM HYDROBROMIDE 40 MG PO TABS: 40.0000 mg | ORAL_TABLET | Freq: Every day | ORAL | Status: DC

## 2015-09-26 MED ORDER — ATORVASTATIN CALCIUM 20 MG PO TABS: ORAL_TABLET | ORAL | Status: DC

## 2015-09-26 MED ORDER — LISINOPRIL 20 MG PO TABS: 20.0000 mg | ORAL_TABLET | Freq: Every day | ORAL | Status: DC

## 2015-09-26 NOTE — Progress Notes (Signed)
Subjective:    Patient ID: Philip [name], male    DOB: 1950-08-11, 65 y.o.   MRN: [id number]  HPI  Pt here for follow up and management of chronic medical problems which includes diabetes and hypertension. He is taking most medications regularly.  He has multiple concerns today. First is depression. He has stayed in his apartment or house for the last 2 weeks rarely getting out. The only thing he has done in the way of productivity is to wash dishes. He does take citalopram 20 mg. He has also had some panic attacks by history. He is concerned about fluctuations in his weight but generally it's around 220. He has been off blood pressure medicines for 2 months. I believe the lisinopril was started as a renal protective agent as much for blood pressure. If this feels like his right ear is stopped up and wonders about possibility of cerumen impaction. He also complains of tiredness in his arms. He is able to use his arms effectively whenever he needs to. I wonder about atorvastatin as a calls since it was started before his symptoms began. Other possibility might be a neuropathy.     Patient Active Problem List   Diagnosis Date Noted  . S/P lumbar microdiscectomy 08/01/2014  . Sciatica neuralgia   . Leukocytosis 07/24/2014  . Sciatica 07/23/2014  . PTSD (post-traumatic stress disorder) 07/23/2014  . Altered awareness, transient 07/23/2014  . Hyperlipidemia   . Unspecified vitamin D deficiency 08/20/2013  . HLD (hyperlipidemia) 08/20/2013  . Hypercalcemia 06/19/2013  . Shortness of breath 10/30/2012  . Diabetes ([place]) 10/30/2012  . Non compliance with medical treatment 10/30/2012  . Panic disorder 10/30/2012  . Chest pain 09/24/2010  . Hypertension 09/24/2010  . Dyslipidemia 09/24/2010   Outpatient Encounter Prescriptions as of 09/26/2015  Medication Sig  . aspirin EC 81 MG tablet Take 81 mg by mouth daily.  Marland Kitchen atorvastatin (LIPITOR) 20 MG tablet Take 1 tablet  every OD  .  canagliflozin (INVOKANA) 100 MG TABS tablet Take 1 tablet (100 mg total) by mouth daily.  . citalopram (CELEXA) 20 MG tablet Take 1 tablet (20 mg total) by mouth daily.  . metFORMIN (GLUCOPHAGE) 1000 MG tablet Take 1 tablet (1,000 mg total) by mouth 2 (two) times daily with a meal.  . [DISCONTINUED] atorvastatin (LIPITOR) 20 MG tablet Take 1 tablet  every OD  . [DISCONTINUED] citalopram (CELEXA) 20 MG tablet Take 1 tablet (20 mg total) by mouth daily.  . [DISCONTINUED] INVOKANA 100 MG TABS tablet TAKE ONE TABLET BY MOUTH ONCE DAILY  . [DISCONTINUED] metFORMIN (GLUCOPHAGE) 1000 MG tablet TAKE ONE TABLET BY MOUTH TWICE DAILY WITH A MEAL  . lisinopril (PRINIVIL,ZESTRIL) 20 MG tablet Take 1 tablet (20 mg total) by mouth daily.  . nitroGLYCERIN (NITROSTAT) 0.4 MG SL tablet Place 0.4 mg under the tongue every 5 (five) minutes as needed for chest pain. Reported on 09/26/2015  . [DISCONTINUED] amLODipine (NORVASC) 10 MG tablet Take 1 tablet (10 mg total) by mouth daily. (Patient not taking: Reported on 10/25/2014)  . [DISCONTINUED] citalopram (CELEXA) 20 MG tablet TAKE ONE TABLET BY MOUTH ONCE DAILY **MUST BE SEEN BEFORE NEXT REFILL**  . [DISCONTINUED] lisinopril (PRINIVIL,ZESTRIL) 20 MG tablet TAKE ONE TABLET BY MOUTH ONCE DAILY  . [DISCONTINUED] lisinopril (PRINIVIL,ZESTRIL) 20 MG tablet TAKE ONE TABLET BY MOUTH ONCE DAILY (Patient not taking: Reported on 09/26/2015)  . [DISCONTINUED] metFORMIN (GLUCOPHAGE) 1000 MG tablet TAKE ONE TABLET BY MOUTH TWICE DAILY WITH A MEAL   No  facility-administered encounter medications on file as of 09/26/2015.     Review of Systems  Constitutional: Positive for fatigue and unexpected weight change.  HENT: Positive for ear pain (right ).   Eyes: Negative.   Respiratory: Negative.   Cardiovascular: Negative.        Elevated BP   Gastrointestinal: Negative.   Endocrine: Negative.   Genitourinary: Negative.   Musculoskeletal: Positive for myalgias (bilateral arms and  shoulders).  Skin: Negative.   Allergic/Immunologic: Negative.   Neurological: Negative.   Hematological: Negative.   Psychiatric/Behavioral: Negative.        Depression and panic attakcs       Objective:   Physical Exam  Constitutional: He is oriented to person, place, and time. He appears well-developed and well-nourished.  Cardiovascular: Normal rate, regular rhythm and normal heart sounds.   Pulmonary/Chest: Effort normal and breath sounds normal.  Neurological: He is alert and oriented to person, place, and time.  Upper extremities show strength to be 5 over 5 in all muscle groups. Reflexes are 2+ and symmetric throughout. Sensory exam with monofilament is normal  Psychiatric:  There is poor eye contact, lots of sig.just looking at him. There are signs of depression.ing   BP 145/84 mmHg  Pulse 100  Temp(Src) 97.8 F (36.6 C) (Oral)  Ht 5' 8.5" (1.74 m)  Wt 228 lb (103.42 kg)  BMI 34.16 kg/m2        Assessment & Plan:  1. Essential hypertension We will refill lisinopril and encouraged him to take it. He has been off amlodipine. - CMP14+EGFR - Lipid panel  2. Type 2 diabetes mellitus without complication, without long-term current use of insulin (HCC) Last A1c was 7.5. Expect same today - Bayer DCA Hb A1c Waived - Microalbumin / creatinine urine ratio  3. Panic disorder Increase citalopram to 40 mg and recheck in one month. Encouraged outside activities or hobbies to get him out of the house  [name] MD

## 2015-09-26 NOTE — Patient Instructions (Signed)
Continue current medications. Continue good therapeutic lifestyle changes which include good diet and exercise. Fall precautions discussed with patient. If an FOBT was given today- please return it to our front desk. If you are over 64 years old - you may need Prevnar 13 or the adult Pneumonia vaccine.   After your visit with us today you will receive a survey in the mail or online from [employer] regarding your care with us. Please take a moment to fill this out. Your feedback is very important to us as you can help us better understand your patient needs as well as improve your experience and satisfaction. WE CARE ABOUT YOU!!!    

## 2015-09-26 NOTE — Addendum Note (Signed)
Addended by: Zannie Cove on: 09/26/2015 09:35 AM   Modules accepted: Orders, SmartSet

## 2015-09-27 LAB — CMP14+EGFR
ALT: 13 IU/L (ref 0–44)
AST: 17 IU/L (ref 0–40)
Albumin/Globulin Ratio: 1.7 (ref 1.2–2.2)
Albumin: 4.4 g/dL (ref 3.6–4.8)
Alkaline Phosphatase: 95 IU/L (ref 39–117)
BUN/Creatinine Ratio: 18 (ref 10–24)
BUN: 16 mg/dL (ref 8–27)
Bilirubin Total: 0.4 mg/dL (ref 0.0–1.2)
CO2: 22 mmol/L (ref 18–29)
Calcium: 11 mg/dL — ABNORMAL HIGH (ref 8.6–10.2)
Chloride: 99 mmol/L (ref 96–106)
Creatinine, Ser: 0.9 mg/dL (ref 0.76–1.27)
GFR calc Af Amer: 104 mL/min/{1.73_m2} (ref >59)
GFR calc non Af Amer: 90 mL/min/{1.73_m2} (ref >59)
Globulin, Total: 2.6 g/dL (ref 1.5–4.5)
Glucose: 127 mg/dL — ABNORMAL HIGH (ref 65–99)
Potassium: 4.6 mmol/L (ref 3.5–5.2)
Sodium: 142 mmol/L (ref 134–144)
Total Protein: 7 g/dL (ref 6.0–8.5)

## 2015-09-27 LAB — MICROALBUMIN / CREATININE URINE RATIO
Creatinine, Urine: 23.8 mg/dL
MICROALB/CREAT RATIO: <12.6 mg/g creat (ref 0.0–30.0)
Microalbumin, Urine: <3 ug/mL

## 2015-09-27 LAB — LIPID PANEL
Chol/HDL Ratio: 3.8 ratio units (ref 0.0–5.0)
Cholesterol, Total: 180 mg/dL (ref 100–199)
HDL: 47 mg/dL (ref >39)
LDL Calculated: 88 mg/dL (ref 0–99)
Triglycerides: 227 mg/dL — ABNORMAL HIGH (ref 0–149)
VLDL Cholesterol Cal: 45 mg/dL — ABNORMAL HIGH (ref 5–40)

## 2015-09-29 ENCOUNTER — Other Ambulatory Visit: Payer: Self-pay | Admitting: *Deleted

## 2015-09-29 ENCOUNTER — Telehealth: Payer: Self-pay | Admitting: Family Medicine

## 2015-09-29 DIAGNOSIS — R7309 Other abnormal glucose: Secondary | ICD-10-CM

## 2015-10-28 ENCOUNTER — Encounter (INDEPENDENT_AMBULATORY_CARE_PROVIDER_SITE_OTHER): Payer: Self-pay

## 2015-10-28 ENCOUNTER — Ambulatory Visit (INDEPENDENT_AMBULATORY_CARE_PROVIDER_SITE_OTHER): Payer: BLUE CROSS/BLUE SHIELD | Admitting: Family Medicine

## 2015-10-28 ENCOUNTER — Encounter: Payer: Self-pay | Admitting: Family Medicine

## 2015-10-28 DIAGNOSIS — E119 Type 2 diabetes mellitus without complications: Secondary | ICD-10-CM | POA: Diagnosis not present

## 2015-10-28 DIAGNOSIS — I10 Essential (primary) hypertension: Secondary | ICD-10-CM | POA: Diagnosis not present

## 2015-10-28 LAB — CMP14+EGFR
ALT: 13 IU/L (ref 0–44)
AST: 14 IU/L (ref 0–40)
Albumin/Globulin Ratio: 2.1 (ref 1.2–2.2)
Albumin: 4.4 g/dL (ref 3.6–4.8)
Alkaline Phosphatase: 73 IU/L (ref 39–117)
BUN/Creatinine Ratio: 11 (ref 10–24)
BUN: 11 mg/dL (ref 8–27)
Bilirubin Total: 0.6 mg/dL (ref 0.0–1.2)
CO2: 22 mmol/L (ref 18–29)
Calcium: 10.4 mg/dL — ABNORMAL HIGH (ref 8.6–10.2)
Chloride: 102 mmol/L (ref 96–106)
Creatinine, Ser: 1 mg/dL (ref 0.76–1.27)
GFR calc Af Amer: 92 mL/min/{1.73_m2} (ref >59)
GFR calc non Af Amer: 79 mL/min/{1.73_m2} (ref >59)
Globulin, Total: 2.1 g/dL (ref 1.5–4.5)
Glucose: 124 mg/dL — ABNORMAL HIGH (ref 65–99)
Potassium: 4.8 mmol/L (ref 3.5–5.2)
Sodium: 142 mmol/L (ref 134–144)
Total Protein: 6.5 g/dL (ref 6.0–8.5)

## 2015-10-28 NOTE — Progress Notes (Signed)
Subjective:    Patient ID: Philip [name], male    DOB: 08/09/1950, 65 y.o.   MRN: [id number]  HPI Patient is here today for a 1 month follow up. At his last visit he was very depressed and bored. He has little hobbies and there is nothing to do to get him out of his apartment. Increased Celexa from 20-40 mg and he does not see any difference although I see more contact today and he appears a little brighter. Calcium was elevated at last visit. Looking back through his records in the computer this has been an issue before and parathormone is been checked. That was normal. Also his last visit A1c was elevated. He takes metformin as well as Invokana I suggested that we increase Invokana from 100-200 mg to try to get his A1c more in line with our goal   Review of Systems  Constitutional: Negative.   HENT: Negative.   Eyes: Negative.   Respiratory: Negative.   Cardiovascular: Negative.   Gastrointestinal: Negative.   Endocrine: Negative.   Genitourinary: Negative.   Musculoskeletal: Negative.   Skin: Negative.   Allergic/Immunologic: Negative.   Neurological: Negative.   Hematological: Negative.   Psychiatric/Behavioral: Negative.    Depression screen [facility] 2/9 10/28/2015 10/25/2014  Decreased Interest 3 2  Down, Depressed, Hopeless 2 2  PHQ - 2 Score 5 4  Altered sleeping 2 2  Tired, decreased energy 3 2  Change in appetite 2 2  Feeling bad or failure about yourself  0 2  Trouble concentrating 0 2  Moving slowly or fidgety/restless 0 1  Suicidal thoughts 0 0  PHQ-9 Score 12 15  Difficult doing work/chores Very difficult -      Patient Active Problem List   Diagnosis Date Noted  . S/P lumbar microdiscectomy 08/01/2014  . Sciatica neuralgia   . Leukocytosis 07/24/2014  . Sciatica 07/23/2014  . PTSD (post-traumatic stress disorder) 07/23/2014  . Altered awareness, transient 07/23/2014  . Hyperlipidemia   . Unspecified vitamin D deficiency 08/20/2013  . HLD (hyperlipidemia)  08/20/2013  . Hypercalcemia 06/19/2013  . Shortness of breath 10/30/2012  . Diabetes ([place]) 10/30/2012  . Non compliance with medical treatment 10/30/2012  . Panic disorder 10/30/2012  . Chest pain 09/24/2010  . Hypertension 09/24/2010  . Dyslipidemia 09/24/2010   Outpatient Encounter Prescriptions as of 10/28/2015  Medication Sig  . aspirin EC 81 MG tablet Take 81 mg by mouth daily.  Marland Kitchen atorvastatin (LIPITOR) 20 MG tablet Take 1 tablet  every OD  . canagliflozin (INVOKANA) 100 MG TABS tablet Take 1 tablet (100 mg total) by mouth daily.  . citalopram (CELEXA) 40 MG tablet Take 1 tablet (40 mg total) by mouth daily.  Marland Kitchen lisinopril (PRINIVIL,ZESTRIL) 20 MG tablet Take 1 tablet (20 mg total) by mouth daily.  . metFORMIN (GLUCOPHAGE) 1000 MG tablet Take 1 tablet (1,000 mg total) by mouth 2 (two) times daily with a meal.  . [DISCONTINUED] nitroGLYCERIN (NITROSTAT) 0.4 MG SL tablet Place 0.4 mg under the tongue every 5 (five) minutes as needed for chest pain. Reported on 09/26/2015   No facility-administered encounter medications on file as of 10/28/2015.       Objective:   Physical Exam  Constitutional: He is oriented to person, place, and time. He appears well-developed and well-nourished.  Cardiovascular: Normal rate and regular rhythm.   Pulmonary/Chest: Effort normal and breath sounds normal.  Neurological: He is alert and oriented to person, place, and time. No cranial nerve deficit.  Psychiatric:  He has a normal mood and affect. His behavior is normal.    BP 132/82 mmHg  Pulse 86  Temp(Src) 98.7 F (37.1 C) (Oral)  Ht 5' 8.5" (1.74 m)  Wt 225 lb (102.059 kg)  BMI 33.71 kg/m2  SpO2 98%       Assessment & Plan:  1. Serum calcium elevated Unless calcium is in the 12-13 range I recommend just following. It has been evaluated before with parathormone - CMP14+EGFR  2. Essential hypertension Blood pressure is well controlled 132/82. Continue lisinopril  3. Type 2 diabetes  mellitus without complication, without long-term current use of insulin (HCC) Hemoglobin A1c was elevated one month ago. Will increase Invokana and recheck A1c in 2 more months  [name] MD

## 2016-01-03 IMAGING — CR DG CHEST 1V PORT
1 series · 1 of 1 positions shown · non-contrast
Comparison: None.

CLINICAL DATA: Cough, preop

EXAM:
PORTABLE CHEST - 1 VIEW

[AP]
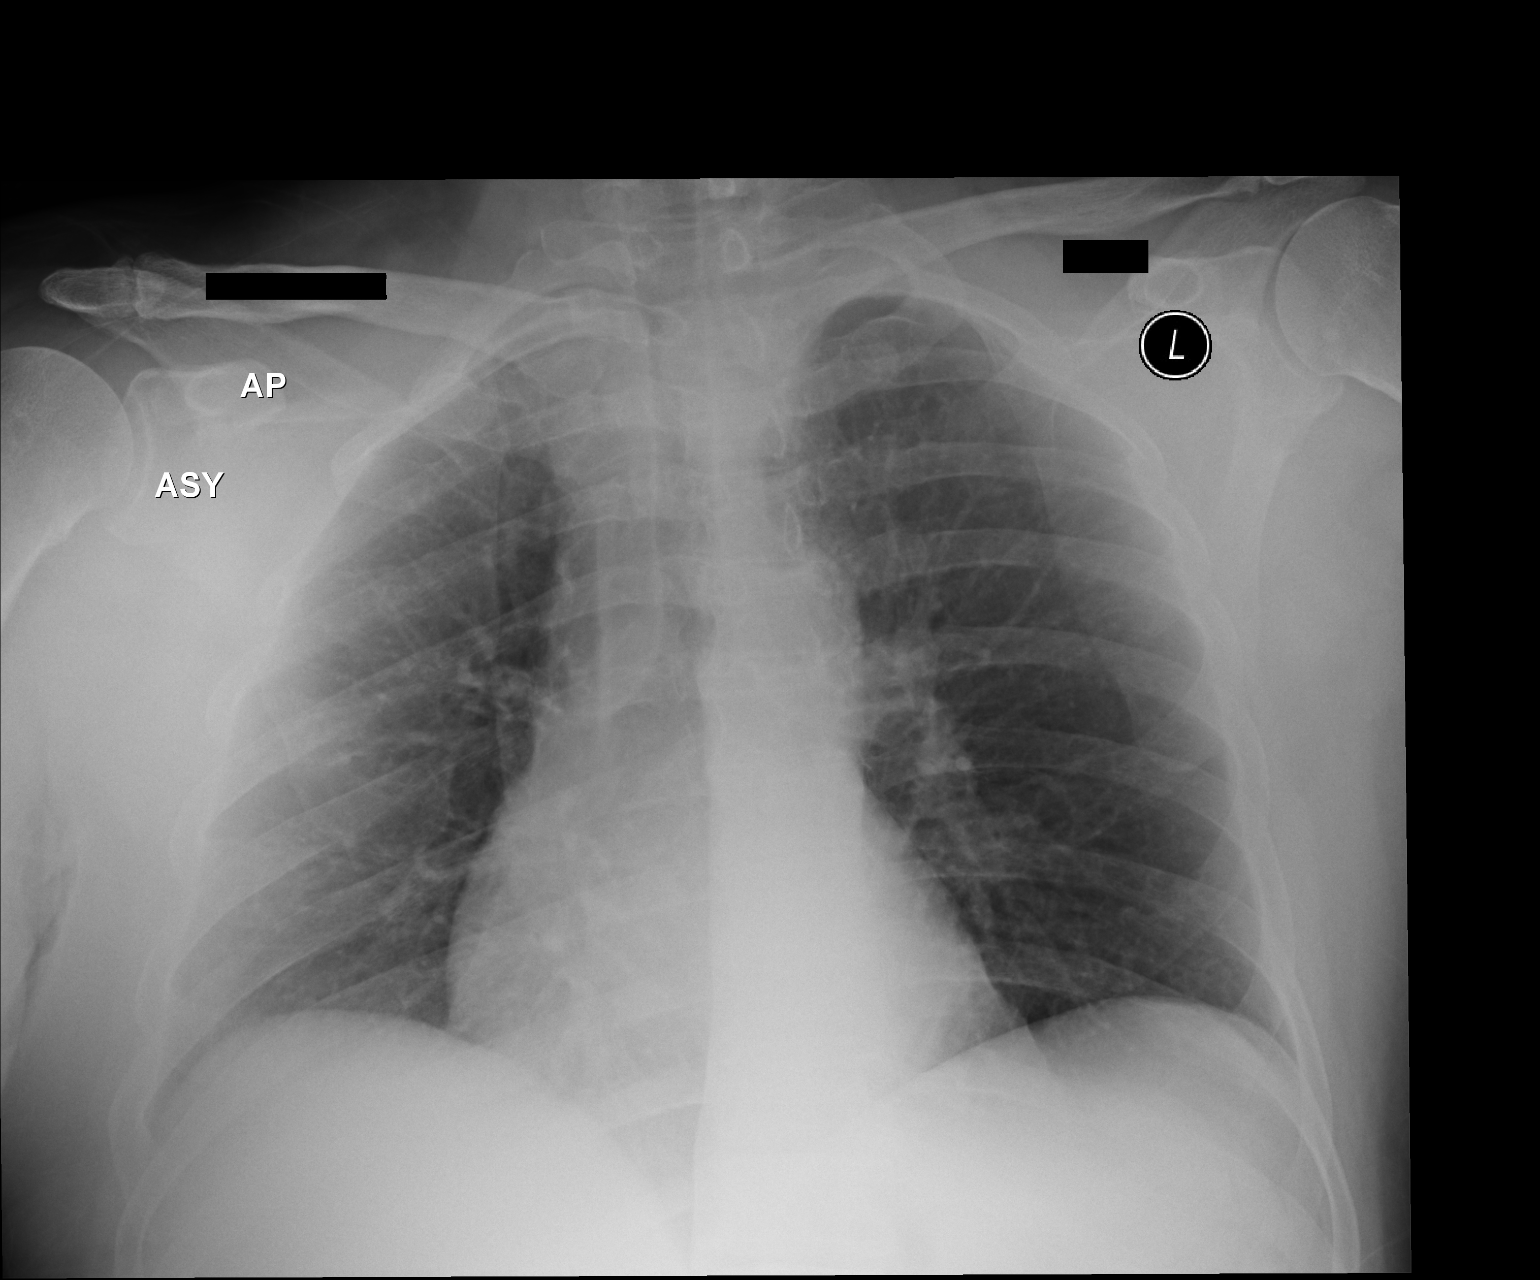

[1 of 1 positions shown; findings below may reference images not displayed]

FINDINGS: Lungs are clear.  No pleural effusion or pneumothorax.

The heart is normal in size.
IMPRESSION: No evidence of acute cardiopulmonary disease.

## 2016-01-04 IMAGING — DX DG LUMBAR SPINE 1V
1 series · 2 of 2 positions shown · non-contrast
Comparison: 07/23/2014

CLINICAL DATA: L4-5 discectomy

EXAM:
LUMBAR SPINE - 1 VIEW

[Series 1: lat · 0.17mm/px · 2 of 2 slices shown]
[im 1/2]
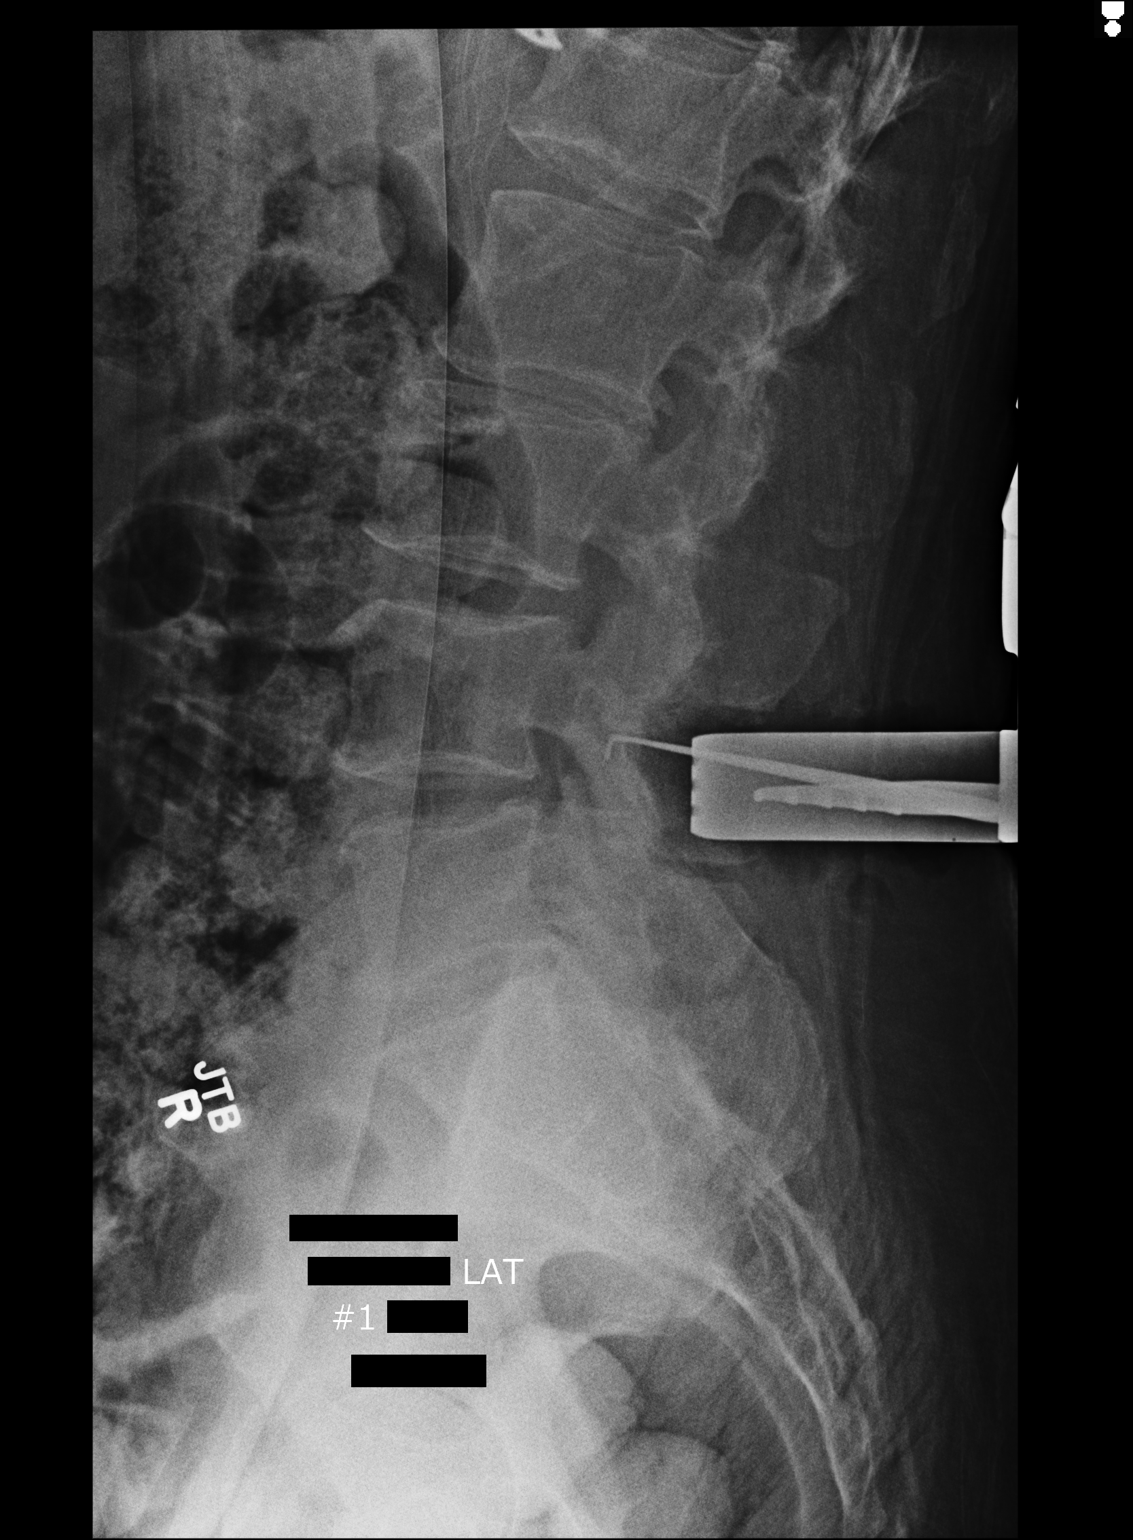
[im 2/2]
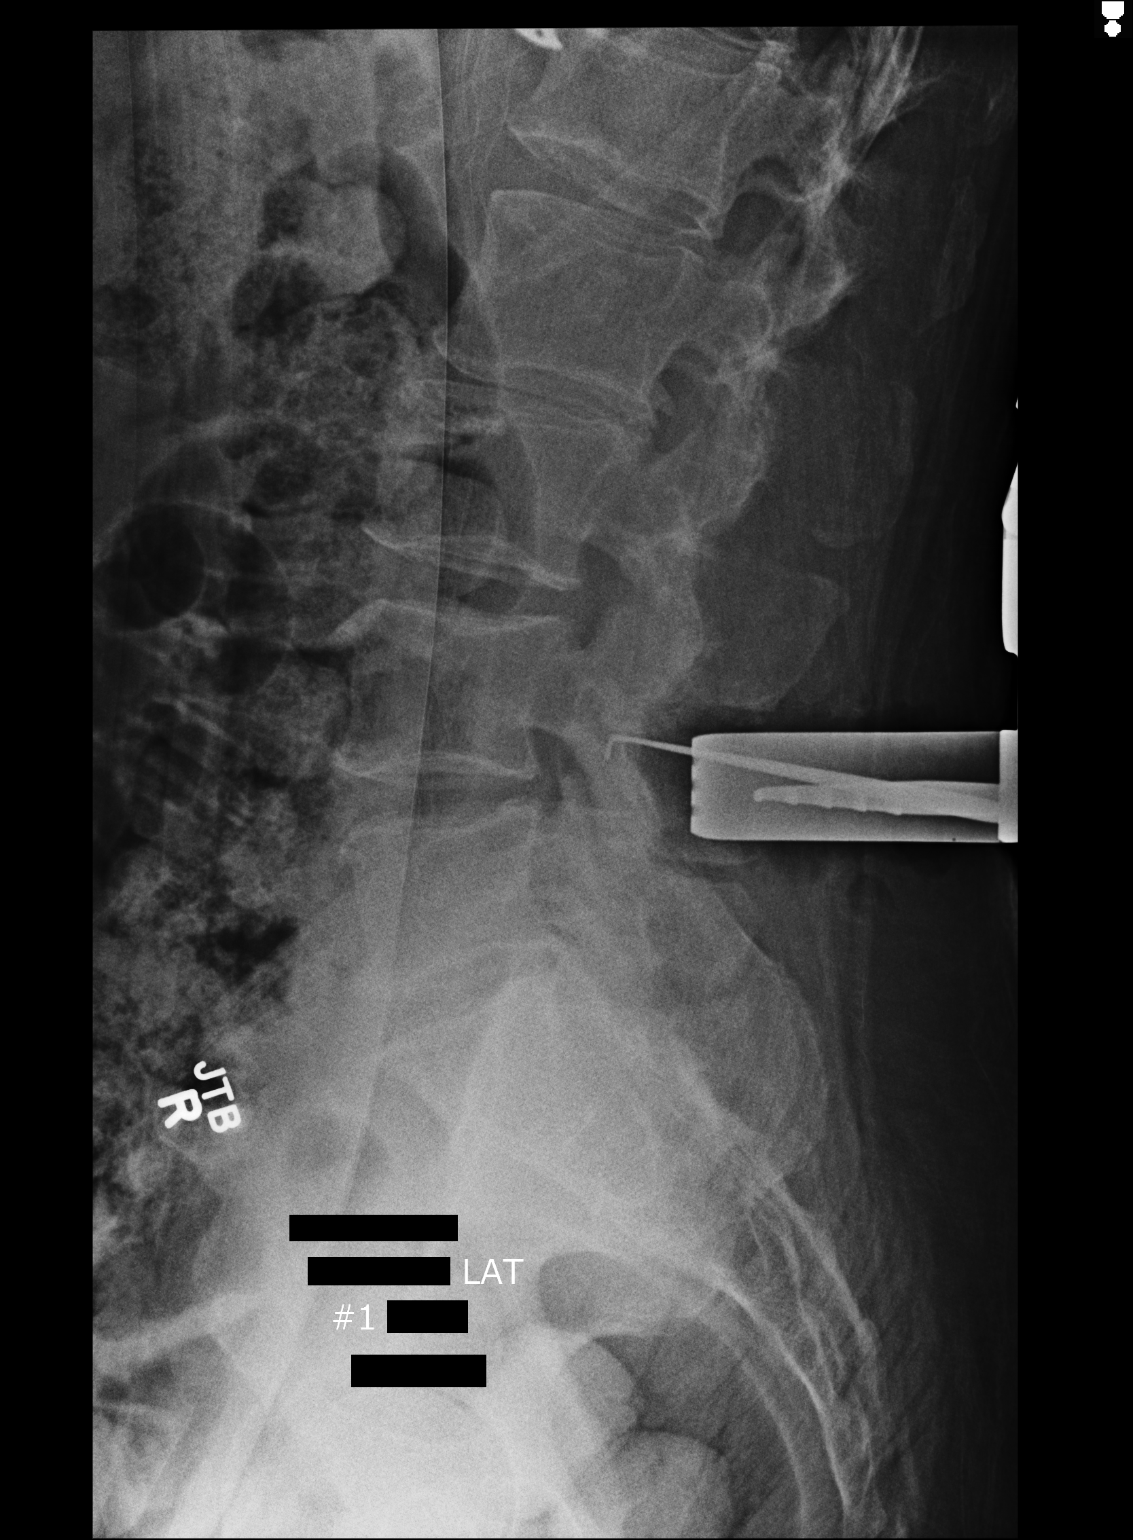

[2 of 2 positions shown; findings below may reference images not displayed]

FINDINGS: Lateral views of the lumbar spine were obtained intraoperatively and
show surgical retractors in instruments at the L4-5 interspace for
discectomy. The numbering nomenclature is similar to that utilized
on prior MRI examination.
IMPRESSION: Intraoperative localization for L4-5 discectomy.

## 2016-01-09 ENCOUNTER — Telehealth: Payer: Self-pay

## 2016-01-09 MED ORDER — CANAGLIFLOZIN 100 MG PO TABS: 100.0000 mg | ORAL_TABLET | Freq: Three times a day (TID) | ORAL | 3 refills | Status: DC

## 2016-01-09 NOTE — Addendum Note (Signed)
Addended by: Zannie Cove on: 01/09/2016 12:20 PM   Modules accepted: Orders

## 2016-01-09 NOTE — Telephone Encounter (Signed)
Please change prescription to requested dose

## 2016-01-14 ENCOUNTER — Other Ambulatory Visit: Payer: Self-pay

## 2016-01-14 MED ORDER — CANAGLIFLOZIN 300 MG PO TABS: ORAL_TABLET | ORAL | 0 refills | Status: DC

## 2016-03-31 ENCOUNTER — Other Ambulatory Visit: Payer: Self-pay | Admitting: Family Medicine

## 2016-04-27 NOTE — Progress Notes (Signed)
Subjective:    Patient ID: Philip [name], male    DOB: 12/23/1950, 66 y.o.   MRN: [id number]  HPI 66 year old gentleman is here to follow-up diabetes hypertension, and hyperlipidemia. He will be going on Medicare next month and would like to have any labs done today that week can make an argument to do. We last assessed his A1c and lipids and liver 7 months ago. He has no specific complaints as far as his medicines and chronic problems but he still is bored. He lost his job and there seems to be nothing else on the horizon. 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 He tells me that some days he sleeps 20 out of 24 hours. Sugars when he checks them at home have been generally less than 150-60. Last A1c was elevated at 6.5.  Patient Active Problem List   Diagnosis Date Noted  . S/P lumbar microdiscectomy 08/01/2014  . Sciatica neuralgia   . Leukocytosis 07/24/2014  . Sciatica 07/23/2014  . PTSD (post-traumatic stress disorder) 07/23/2014  . Altered awareness, transient 07/23/2014  . Hyperlipidemia   . Unspecified vitamin D deficiency 08/20/2013  . HLD (hyperlipidemia) 08/20/2013  . Hypercalcemia 06/19/2013  . Shortness of breath 10/30/2012  . Diabetes ([place]) 10/30/2012  . Non compliance with medical treatment 10/30/2012  . Panic disorder 10/30/2012  . Chest pain 09/24/2010  . Hypertension 09/24/2010  . Dyslipidemia 09/24/2010   Outpatient Encounter Prescriptions as of 04/28/2016  Medication Sig  . aspirin EC 81 MG tablet Take 81 mg by mouth daily.  Marland Kitchen atorvastatin (LIPITOR) 20 MG tablet Take 1 tablet  every OD  . citalopram (CELEXA) 40 MG tablet Take 1 tablet (40 mg total) by mouth daily.  . INVOKANA 300 MG TABS tablet TAKE ONE TABLET BY MOUTH ONCE DAILY  . lisinopril (PRINIVIL,ZESTRIL) 20 MG tablet Take 1 tablet (20 mg total) by mouth daily.  . metFORMIN (GLUCOPHAGE) 1000 MG tablet Take 1 tablet (1,000 mg total) by mouth 2 (two)  times daily with a meal.   No facility-administered encounter medications on file as of 04/28/2016.       Review of Systems  Constitutional: Negative.   Respiratory: Negative.   Cardiovascular: Negative.   Neurological: Negative.   Psychiatric/Behavioral: Positive for sleep disturbance.       Objective:   Physical Exam  Constitutional: He is oriented to person, place, and time. He appears well-developed and well-nourished.  HENT:  Mouth/Throat: Oropharynx is clear and moist.  Cardiovascular: Normal rate and regular rhythm.   Pulmonary/Chest: Effort normal and breath sounds normal.  Abdominal: Soft.  Neurological: He is alert and oriented to person, place, and time.  Psychiatric: He has a normal mood and affect. His behavior is normal.   BP 118/74   Pulse 97   Temp 99.2 F (37.3 C) (Oral)   Ht 5' 8.5" (1.74 m)   Wt 214 lb 12.8 oz (97.4 kg)   BMI 32.19 kg/m         Assessment & Plan:  1. Essential hypertension Blood pressures are well controlled on lisinopril 20 mg  2. Type 2 diabetes mellitus without complication, without long-term current use of insulin (HCC) According to his history, A1c should be improved. So far he has been able to afford his medicines but with change in insurance to a Medicare advantage plan we may have to find alternative - Bayer DCA Hb A1c Waived  3. Dyslipidemia Tolerates statin okay; lipids  assessed about 7 months ago and were at goal - Hepatic function panel - Lipid panel  [name] MD

## 2016-04-28 ENCOUNTER — Encounter: Payer: Self-pay | Admitting: Family Medicine

## 2016-04-28 ENCOUNTER — Ambulatory Visit (INDEPENDENT_AMBULATORY_CARE_PROVIDER_SITE_OTHER): Payer: BLUE CROSS/BLUE SHIELD | Admitting: Family Medicine

## 2016-04-28 VITALS — BP 118/74 | HR 97 | Temp 99.2°F | Ht 68.5 in | Wt 214.8 lb

## 2016-04-28 DIAGNOSIS — I10 Essential (primary) hypertension: Secondary | ICD-10-CM | POA: Diagnosis not present

## 2016-04-28 DIAGNOSIS — E119 Type 2 diabetes mellitus without complications: Secondary | ICD-10-CM | POA: Diagnosis not present

## 2016-04-28 DIAGNOSIS — E785 Hyperlipidemia, unspecified: Secondary | ICD-10-CM | POA: Diagnosis not present

## 2016-04-28 LAB — BAYER DCA HB A1C WAIVED: HB A1C (BAYER DCA - WAIVED): 7.5 % — ABNORMAL HIGH (ref <7.0)

## 2016-04-29 LAB — HEPATIC FUNCTION PANEL
ALT: 15 IU/L (ref 0–44)
AST: 20 IU/L (ref 0–40)
Albumin: 4.5 g/dL (ref 3.6–4.8)
Alkaline Phosphatase: 73 IU/L (ref 39–117)
Bilirubin Total: 0.6 mg/dL (ref 0.0–1.2)
Bilirubin, Direct: 0.14 mg/dL (ref 0.00–0.40)
Total Protein: 7 g/dL (ref 6.0–8.5)

## 2016-04-29 LAB — LIPID PANEL
Chol/HDL Ratio: 5.8 ratio units — ABNORMAL HIGH (ref 0.0–5.0)
Cholesterol, Total: 237 mg/dL — ABNORMAL HIGH (ref 100–199)
HDL: 41 mg/dL (ref >39)
LDL Calculated: 150 mg/dL — ABNORMAL HIGH (ref 0–99)
Triglycerides: 230 mg/dL — ABNORMAL HIGH (ref 0–149)
VLDL Cholesterol Cal: 46 mg/dL — ABNORMAL HIGH (ref 5–40)

## 2016-05-20 ENCOUNTER — Encounter: Payer: Self-pay | Admitting: *Deleted

## 2016-07-22 ENCOUNTER — Ambulatory Visit (INDEPENDENT_AMBULATORY_CARE_PROVIDER_SITE_OTHER): Payer: Commercial Managed Care - HMO | Admitting: Pediatrics

## 2016-07-22 ENCOUNTER — Encounter: Payer: Self-pay | Admitting: Pediatrics

## 2016-07-22 VITALS — BP 132/80 | HR 120 | Temp 97.6°F | Ht 68.5 in

## 2016-07-22 DIAGNOSIS — F329 Major depressive disorder, single episode, unspecified: Secondary | ICD-10-CM

## 2016-07-22 DIAGNOSIS — F32A Depression, unspecified: Secondary | ICD-10-CM

## 2016-07-22 DIAGNOSIS — I10 Essential (primary) hypertension: Secondary | ICD-10-CM | POA: Diagnosis not present

## 2016-07-22 DIAGNOSIS — E785 Hyperlipidemia, unspecified: Secondary | ICD-10-CM | POA: Diagnosis not present

## 2016-07-22 DIAGNOSIS — R5383 Other fatigue: Secondary | ICD-10-CM | POA: Diagnosis not present

## 2016-07-22 DIAGNOSIS — E119 Type 2 diabetes mellitus without complications: Secondary | ICD-10-CM | POA: Diagnosis not present

## 2016-07-22 LAB — GLUCOSE HEMOCUE WAIVED: Glu Hemocue Waived: 206 mg/dL — ABNORMAL HIGH (ref 65–99)

## 2016-07-22 MED ORDER — ATORVASTATIN CALCIUM 20 MG PO TABS: ORAL_TABLET | ORAL | 3 refills | Status: DC

## 2016-07-22 MED ORDER — LISINOPRIL 10 MG PO TABS: 10.0000 mg | ORAL_TABLET | Freq: Every day | ORAL | 3 refills | Status: DC

## 2016-07-22 MED ORDER — CITALOPRAM HYDROBROMIDE 20 MG PO TABS: 40.0000 mg | ORAL_TABLET | Freq: Every day | ORAL | 2 refills | Status: DC

## 2016-07-22 MED ORDER — METFORMIN HCL 1000 MG PO TABS: 1000.0000 mg | ORAL_TABLET | Freq: Two times a day (BID) | ORAL | 3 refills | Status: DC

## 2016-07-22 NOTE — Progress Notes (Signed)
Subjective:   Patient ID: Philip [name], male    DOB: October 27, 1950, 66 y.o.   MRN: [id number] CC: Weakness (pt walked in today c/o weakness x 2 weeks and states he hasn't been on any of his medications since the end of Feb)  HPI: Philip [name] is a 66 y.o. male presenting for Weakness (pt walked in today c/o weakness x 2 weeks and states he hasn't been on any of his medications since the end of Feb)  Has been sick for the past 3 weeks, describes it as general weakness Having some sciatica pain, ongoing for past 1.5 months No symptoms worse today Just got his medicare card today so came in to be seen today Slept 20/24 hours at a time last week, has been on spring break Rice, eggs, mac and cheese the only thing he wants to eat Says he has no appetite, no abd pain, no nausea He has ongoing depression, but all the recent changes in his life have been good he says He does want to get back on citalopram medication he has been on for depression Feels safe at home Ran out of his meds 5 weeks ago, has not been taking anything  DM2: 80s-140s BGLs at home  No SOB, no nausea or sweating No chest pain or pressure Says he feels a general weakness and tiredness everywhere "like a weight on my shoulders" Feels sluggish Muscles feel weak, shoulders and arms mostly No muscle pain Sometimes feels weak in his arms, doesn't want to pick up a book No weakness in legs No trouble going up and down stairs No worsening symptoms with exertion  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 Allergies and medications reviewed and updated. History  Smoking Status  . Current Some Day Smoker  . Packs/day: 0.25  Smokeless Tobacco  . Never  Used   ROS: Per HPI   Objective:    BP 132/80 (BP Location: Left Arm, Patient Position: Sitting, Cuff Size: Normal)   Pulse (!) 120   Temp 97.6 F (36.4 C) (Oral)   Ht 5' 8.5" (1.74 m)   Wt Readings from Last 3 Encounters:  04/28/16 214 lb 12.8 oz (97.4 kg)  10/28/15 225 lb (102.1 kg)  09/26/15 228 lb (103.4 kg)    Gen: NAD, alert, cooperative with exam, NCAT EYES: EOMI, no conjunctival injection, or no icterus ENT:  TMs pearly gray b/l, OP without erythema LYMPH: no cervical LAD CV: NRRR, normal S1/S2, no murmur, distal pulses 2+ b/l Resp: CTABL, no wheezes, normal WOB Abd: +BS, soft, NTND. no guarding or organomegaly Ext: No edema, warm Neuro: Alert and oriented, strength equal b/l 5/5 hand grip, and LE, coordination grossly normal MSK: normal muscle bulk Psych: normal affect  EKG: HR 115, sinus rhythm  Assessment & Plan:  [name] was seen today for weakness, fatigue since being off of his medications.  Diagnoses and all orders for this visit:  Other fatigue Non-focal exam No chest pain, SOB Sinus tachycardia on EKG Will get labs, restart medications Discussed return precautions -     CMP14+EGFR -     CBC with Differential/Platelet -     TSH -     Glucose Hemocue Waived  Depression,  unspecified depression type Start with 21m for a few days before increasing to 420mPossible that withdrawal from SSRI contributing to symptoms -     citalopram (CELEXA) 20 MG tablet; Take 2 tablets (40 mg total) by mouth daily.  Hyperlipidemia, unspecified hyperlipidemia type Stable, restart below -     atorvastatin (LIPITOR) 20 MG tablet; Take 1 tablet  every OD  Essential hypertension Slightly elevated BP, restart below -     lisinopril (PRINIVIL,ZESTRIL) 10 MG tablet; Take 1 tablet (10 mg total) by mouth daily.  Type 2 diabetes mellitus without complication, without long-term current use of insulin (HCModocgucose 206 in clinic Restart metformin -     metFORMIN (GLUCOPHAGE)  1000 MG tablet; Take 1 tablet (1,000 mg total) by mouth 2 (two) times daily with a meal.  Follow up plan: Return in about 3 weeks (around 08/12/2016).  CaAssunta FoundMD [facility] [place]

## 2016-07-23 LAB — CBC WITH DIFFERENTIAL/PLATELET
Basophils Absolute: 0 10*3/uL (ref 0.0–0.2)
Basos: 0 %
EOS (ABSOLUTE): 0.1 10*3/uL (ref 0.0–0.4)
Eos: 1 %
Hematocrit: 48 % (ref 37.5–51.0)
Hemoglobin: 16 g/dL (ref 13.0–17.7)
Immature Grans (Abs): 0 10*3/uL (ref 0.0–0.1)
Immature Granulocytes: 0 %
Lymphocytes Absolute: 2.8 10*3/uL (ref 0.7–3.1)
Lymphs: 32 %
MCH: 30.5 pg (ref 26.6–33.0)
MCHC: 33.3 g/dL (ref 31.5–35.7)
MCV: 92 fL (ref 79–97)
Monocytes Absolute: 0.9 10*3/uL (ref 0.1–0.9)
Monocytes: 10 %
Neutrophils Absolute: 5 10*3/uL (ref 1.4–7.0)
Neutrophils: 57 %
Platelets: 305 10*3/uL (ref 150–379)
RBC: 5.24 x10E6/uL (ref 4.14–5.80)
RDW: 14.1 % (ref 12.3–15.4)
WBC: 8.9 10*3/uL (ref 3.4–10.8)

## 2016-07-23 LAB — CMP14+EGFR
ALT: 18 IU/L (ref 0–44)
AST: 21 IU/L (ref 0–40)
Albumin/Globulin Ratio: 1.8 (ref 1.2–2.2)
Albumin: 4.5 g/dL (ref 3.6–4.8)
Alkaline Phosphatase: 83 IU/L (ref 39–117)
BUN/Creatinine Ratio: 13 (ref 10–24)
BUN: 13 mg/dL (ref 8–27)
Bilirubin Total: 0.4 mg/dL (ref 0.0–1.2)
CO2: 24 mmol/L (ref 18–29)
Calcium: 11.3 mg/dL — ABNORMAL HIGH (ref 8.6–10.2)
Chloride: 100 mmol/L (ref 96–106)
Creatinine, Ser: 1.02 mg/dL (ref 0.76–1.27)
GFR calc Af Amer: 89 mL/min/{1.73_m2} (ref >59)
GFR calc non Af Amer: 77 mL/min/{1.73_m2} (ref >59)
Globulin, Total: 2.5 g/dL (ref 1.5–4.5)
Glucose: 168 mg/dL — ABNORMAL HIGH (ref 65–99)
Potassium: 4.9 mmol/L (ref 3.5–5.2)
Sodium: 141 mmol/L (ref 134–144)
Total Protein: 7 g/dL (ref 6.0–8.5)

## 2016-07-23 LAB — TSH: TSH: 1.1 u[IU]/mL (ref 0.450–4.500)

## 2016-08-04 ENCOUNTER — Other Ambulatory Visit: Payer: Self-pay | Admitting: Family Medicine

## 2016-08-16 ENCOUNTER — Encounter: Payer: Self-pay | Admitting: Pediatrics

## 2016-08-16 ENCOUNTER — Ambulatory Visit (INDEPENDENT_AMBULATORY_CARE_PROVIDER_SITE_OTHER): Payer: Commercial Managed Care - HMO | Admitting: Pediatrics

## 2016-08-16 DIAGNOSIS — I10 Essential (primary) hypertension: Secondary | ICD-10-CM

## 2016-08-16 DIAGNOSIS — E119 Type 2 diabetes mellitus without complications: Secondary | ICD-10-CM | POA: Diagnosis not present

## 2016-08-16 DIAGNOSIS — F329 Major depressive disorder, single episode, unspecified: Secondary | ICD-10-CM

## 2016-08-16 DIAGNOSIS — R251 Tremor, unspecified: Secondary | ICD-10-CM | POA: Diagnosis not present

## 2016-08-16 DIAGNOSIS — M5431 Sciatica, right side: Secondary | ICD-10-CM | POA: Diagnosis not present

## 2016-08-16 DIAGNOSIS — F32A Depression, unspecified: Secondary | ICD-10-CM

## 2016-08-16 LAB — BAYER DCA HB A1C WAIVED: HB A1C (BAYER DCA - WAIVED): 7.5 % — ABNORMAL HIGH (ref <7.0)

## 2016-08-16 MED ORDER — NAPROXEN 500 MG PO TABS: 500.0000 mg | ORAL_TABLET | Freq: Two times a day (BID) | ORAL | 0 refills | Status: DC

## 2016-08-16 MED ORDER — CANAGLIFLOZIN 100 MG PO TABS: 100.0000 mg | ORAL_TABLET | Freq: Every day | ORAL | 1 refills | Status: DC

## 2016-08-16 NOTE — Progress Notes (Signed)
Subjective:   Patient ID: Philip [name], male    DOB: 1950-11-12, 66 y.o.   MRN: [id number] CC: Follow-up (3 week) multiple med problems HPI: Philip [name] is a 66 y.o. male presenting for Follow-up (3 week)  DM2: BGLs mid 100s in the morning, and afternoon, low 121, high low 200s  HTN: No headaches, CP, SOB Checks BPs at [employer], says has not been higher than 130s  Depression: Thinks his mood would be better if work wasn't as stressful as it is Getting back on the citalopram has helped a lot he thinks No thoughts of self harm  Feels weak often, sometimes shaky Says in hte past he has thought that it is from having low blood sugars Has slight tremor in his hands at times Turning a page for example hewill feel shaky sometimes, not always  R hip pain for past 2 months, says pain is a constant 4/10, doesn't come and go Points to R mid buttock for location of pain Also has pain in R lower leg that feels like burning He didn't have this pain in the past Had been pain free for 2 years after lower back surgery before this pain started again  Depression screen [facility] 2/9 08/16/2016 07/22/2016 04/28/2016 10/28/2015 10/25/2014  Decreased Interest 2 0 _0 Down, Depressed, Hopeless 2 0 _1 PHQ - 2 Score 4 0 _2 Altered sleeping 2 - _3 Tired, decreased energy 3 - _4 Change in appetite 2 - _5 Feeling bad or failure about yourself  2 - 1 0 2  Trouble concentrating 1 - 2 0 2  Moving slowly or fidgety/restless 1 - 0 0 1  Suicidal thoughts 0 - 1 0 0  PHQ-9 Score 15 - _6 Difficult doing work/chores Somewhat difficult - - Very difficult -     Relevant past medical, surgical, family and social history reviewed. Allergies and medications reviewed and updated. History  Smoking Status  . Current Some Day Smoker  . Packs/day: 0.25  Smokeless Tobacco  . Never Used   ROS: Per HPI   Objective:    BP 128/80   Pulse 100   Temp 97 F (36.1 C) (Oral)   Ht 5' 8.5" (1.74  m)   Wt 221 lb 3.2 oz (100.3 kg)   BMI 33.14 kg/m   Wt Readings from Last 3 Encounters:  08/16/16 221 lb 3.2 oz (100.3 kg)  04/28/16 214 lb 12.8 oz (97.4 kg)  10/28/15 225 lb (102.1 kg)    Gen: NAD, alert, cooperative with exam, NCAT EYES: EOMI, no conjunctival injection, or no icterus CV: NRRR, normal S1/S2 Resp: CTABL, no wheezes, normal WOB Abd: +BS, soft, NTND. no guarding or organomegaly Ext: No edema, warm Neuro: Alert and oriented, strength equal b/l LE hip flexor, ext/flex at knee 5/5, sensation intact b/l LE. Finger-nose finger normal b/l hands, no resting tremor  MSK: no point tenderness over spine  Assessment & Plan:  Philip [name] was seen today for follow-up mulitple med problems  Diagnoses and all orders for this visit:  Hypercalcemia Elevated in the past per chart review, then back to normal Will repeat level with albumin, PTH today -     CMP14+EGFR -     Parathyroid hormone, intact (no Ca)  Type 2 diabetes mellitus without complication, without long-term current use of insulin (HCC) A1c 7.5 Restart invokana 152m Cont metformin -  Bayer DCA Hb A1c Waived -     canagliflozin (INVOKANA) 100 MG TABS tablet; Take 1 tablet (100 mg total) by mouth daily before breakfast.  Essential hypertension Adequate control, cont current meds  Sciatica of right side Ongoing past almost 2 mo Not regularly taking NSAIDs Discussed options, including PT, referral  Pt wants to try below If not improving will let m eknow -     naproxen (NAPROSYN) 500 MG tablet; Take 1 tablet (500 mg total) by mouth 2 (two) times daily with a meal.  Depression, unspecified depression type Symptoms much improved since restarting SSRI Feeling safe at home, no thoughts of self harm or not wanting to be here discussed vBHI program Pt declined Given PHQ9 discussed with pt he will likely get a call anyway, he says that is fine  Tremor No tremor/shakiness today Any worsening let me know  Follow up  plan: Return in about 3 months (around 11/15/2016). Assunta Found, MD [place]

## 2016-08-17 ENCOUNTER — Telehealth: Payer: Self-pay | Admitting: Family Medicine

## 2016-08-17 DIAGNOSIS — E119 Type 2 diabetes mellitus without complications: Secondary | ICD-10-CM

## 2016-08-17 LAB — CMP14+EGFR
ALT: 20 IU/L (ref 0–44)
AST: 24 IU/L (ref 0–40)
Albumin/Globulin Ratio: 1.8 (ref 1.2–2.2)
Albumin: 4.5 g/dL (ref 3.6–4.8)
Alkaline Phosphatase: 89 IU/L (ref 39–117)
BUN/Creatinine Ratio: 11 (ref 10–24)
BUN: 9 mg/dL (ref 8–27)
Bilirubin Total: 0.6 mg/dL (ref 0.0–1.2)
CO2: 22 mmol/L (ref 18–29)
Calcium: 11 mg/dL — ABNORMAL HIGH (ref 8.6–10.2)
Chloride: 101 mmol/L (ref 96–106)
Creatinine, Ser: 0.79 mg/dL (ref 0.76–1.27)
GFR calc Af Amer: 109 mL/min/{1.73_m2} (ref >59)
GFR calc non Af Amer: 94 mL/min/{1.73_m2} (ref >59)
Globulin, Total: 2.5 g/dL (ref 1.5–4.5)
Glucose: 165 mg/dL — ABNORMAL HIGH (ref 65–99)
Potassium: 4.9 mmol/L (ref 3.5–5.2)
Sodium: 143 mmol/L (ref 134–144)
Total Protein: 7 g/dL (ref 6.0–8.5)

## 2016-08-17 LAB — PARATHYROID HORMONE, INTACT (NO CA): PTH: 34 pg/mL (ref 15–65)

## 2016-08-18 MED ORDER — EMPAGLIFLOZIN 10 MG PO TABS: 10.0000 mg | ORAL_TABLET | Freq: Every day | ORAL | 1 refills | Status: DC

## 2016-08-18 NOTE — Telephone Encounter (Signed)
Sent in jardiance 10mg .

## 2016-08-19 ENCOUNTER — Telehealth: Payer: Self-pay | Admitting: Pediatrics

## 2016-08-19 LAB — CALCITRIOL (1,25 DI-OH VIT D): Vit D, 1,25-Dihydroxy: 86.6 pg/mL — ABNORMAL HIGH (ref 19.9–79.3)

## 2016-08-19 LAB — VITAMIN D 25 HYDROXY (VIT D DEFICIENCY, FRACTURES): Vit D, 25-Hydroxy: 18.2 ng/mL — ABNORMAL LOW (ref 30.0–100.0)

## 2016-08-19 LAB — SPECIMEN STATUS REPORT

## 2016-08-19 NOTE — Telephone Encounter (Signed)
That's fine, he needs to comeback for PTHrP lab when able to check parathyroid function, we werent able to add it on to labs already drawn. Does not have to be fasting

## 2016-08-19 NOTE — Addendum Note (Signed)
Addended by: Eustaquio Maize on: 08/19/2016 04:40 PM   Modules accepted: Orders

## 2016-08-19 NOTE — Telephone Encounter (Signed)
Please advise 

## 2016-08-19 NOTE — Telephone Encounter (Signed)
Patient aware and verbalizes understanding. 

## 2016-08-23 ENCOUNTER — Other Ambulatory Visit: Payer: Medicare HMO

## 2016-08-26 LAB — PTH-RELATED PEPTIDE: PTH-related peptide: <2 pmol/L

## 2016-11-16 ENCOUNTER — Other Ambulatory Visit: Payer: Self-pay | Admitting: *Deleted

## 2016-11-16 DIAGNOSIS — F32A Depression, unspecified: Secondary | ICD-10-CM

## 2016-11-16 DIAGNOSIS — F329 Major depressive disorder, single episode, unspecified: Secondary | ICD-10-CM

## 2016-11-16 MED ORDER — CITALOPRAM HYDROBROMIDE 20 MG PO TABS: 40.0000 mg | ORAL_TABLET | Freq: Every day | ORAL | 2 refills | Status: DC

## 2016-11-16 NOTE — Telephone Encounter (Signed)
Last seen 4/30 last filled 04/05

## 2016-11-18 NOTE — Telephone Encounter (Signed)
Attempted to contact patient to let him know medication has been sent to pharmacy.  NVM set up

## 2016-12-10 ENCOUNTER — Telehealth: Payer: Self-pay | Admitting: Family Medicine

## 2016-12-10 NOTE — Telephone Encounter (Signed)
FYI Per pt he does not currently have insurance He can not afford to come in for appt Nor can he afford his medication He is trying to diet and exercise

## 2016-12-14 NOTE — Telephone Encounter (Signed)
He should be able to continue most of his medicines. Metformin, lisinopril, citalopram are all $4 for 30 days at [facility] Atorvastatin is $9 Philip [name] will be too expensive for now. Check BGLs every morning. If they remain elevated there are other diabetes drugs we could use.  Does he need refills of medicines sent in? OK to do 30 days with 3 refills on above. If medications are too expensive he should call health department, they have a program to help get medicines.

## 2017-01-13 NOTE — Telephone Encounter (Signed)
LM 1 mo ago = note closed

## 2017-08-24 ENCOUNTER — Other Ambulatory Visit: Payer: Self-pay | Admitting: Pediatrics

## 2017-08-24 DIAGNOSIS — E119 Type 2 diabetes mellitus without complications: Secondary | ICD-10-CM

## 2017-10-24 ENCOUNTER — Telehealth: Payer: Self-pay

## 2017-10-24 ENCOUNTER — Ambulatory Visit (INDEPENDENT_AMBULATORY_CARE_PROVIDER_SITE_OTHER): Payer: Medicare HMO | Admitting: Pediatrics

## 2017-10-24 ENCOUNTER — Encounter: Payer: Self-pay | Admitting: Pediatrics

## 2017-10-24 VITALS — BP 133/84 | HR 95 | Temp 98.5°F | Resp 20 | Ht 68.5 in | Wt 219.0 lb

## 2017-10-24 DIAGNOSIS — F329 Major depressive disorder, single episode, unspecified: Secondary | ICD-10-CM

## 2017-10-24 DIAGNOSIS — E119 Type 2 diabetes mellitus without complications: Secondary | ICD-10-CM | POA: Diagnosis not present

## 2017-10-24 DIAGNOSIS — F419 Anxiety disorder, unspecified: Secondary | ICD-10-CM | POA: Diagnosis not present

## 2017-10-24 DIAGNOSIS — E785 Hyperlipidemia, unspecified: Secondary | ICD-10-CM | POA: Diagnosis not present

## 2017-10-24 DIAGNOSIS — F32A Depression, unspecified: Secondary | ICD-10-CM

## 2017-10-24 DIAGNOSIS — I10 Essential (primary) hypertension: Secondary | ICD-10-CM

## 2017-10-24 LAB — BAYER DCA HB A1C WAIVED: HB A1C (BAYER DCA - WAIVED): 10 % — ABNORMAL HIGH (ref <7.0)

## 2017-10-24 MED ORDER — ATORVASTATIN CALCIUM 20 MG PO TABS: ORAL_TABLET | ORAL | 3 refills | Status: DC

## 2017-10-24 MED ORDER — METFORMIN HCL 1000 MG PO TABS: 1000.0000 mg | ORAL_TABLET | Freq: Two times a day (BID) | ORAL | 3 refills | Status: DC

## 2017-10-24 MED ORDER — CITALOPRAM HYDROBROMIDE 20 MG PO TABS: 20.0000 mg | ORAL_TABLET | Freq: Every day | ORAL | 1 refills | Status: DC

## 2017-10-24 MED ORDER — EMPAGLIFLOZIN 10 MG PO TABS: 10.0000 mg | ORAL_TABLET | Freq: Every day | ORAL | 1 refills | Status: DC

## 2017-10-24 NOTE — BH Specialist Note (Signed)
[place] Initial Clinical Assessment  MRN: [id number] NAME: Philip [name] Date: 10/24/17  Total time: 1 hour  Type of Contact: Type of Contact: Phone Call Initial Contact Patient consent obtained: Patient consent obtained for Virtual Visit: (NA) Reason for Visit today: Reason for Your Call/Visit Today: VBH Intake Assessment   Treatment History Patient recently received Inpatient Treatment: Have You Recently Been in Any Inpatient Treatment (Hospital/Detox/Crisis Center/28-Day Program)?: No  Facility/Program:  No  Date of discharge:  No Patient currently being seen by therapist/psychiatrist: Do You Currently Have a Therapist/Psychiatrist?: No   Patient currently receiving the following services:  No  Psychiatric History  Past Psychiatric History/Hospitalization(s): Anxiety: Yes Bipolar Disorder: No Depression: Yes Mania: No Psychosis: No Schizophrenia: No Personality Disorder: No Hospitalization for psychiatric illness: No History of Electroconvulsive Shock Therapy: No Prior Suicide Attempts: No Decreased need for sleep: No  Euphoria: No Self Injurious behaviors No Family History of mental illness: No Family History of substance abuse: No  Substance Abuse: No  DUI: No  Insomnia: No  History of violence No  Physical, sexual or emotional abuse:No  Prior outpatient mental health therapy: No      Clinical Assessment:  PHQ-9 Assessments: Depression screen [facility] 2/9 10/24/2017 08/16/2016 07/22/2016  Decreased Interest 3 2 0  Down, Depressed, Hopeless 3 2 0  PHQ - 2 Score 6 4 0  Altered sleeping 2 2 -  Tired, decreased energy 3 3 -  Change in appetite 2 2 -  Feeling bad or failure about yourself  1 2 -  Trouble concentrating 2 1 -  Moving slowly or fidgety/restless 3 1 -  Suicidal thoughts 0 0 -  PHQ-9 Score 19 15 -  Difficult doing work/chores Somewhat difficult Somewhat difficult -    GAD-7 Assessments: GAD 7 : Generalized Anxiety Score 10/24/2017  08/16/2016  Nervous, Anxious, on Edge 3 3  Control/stop worrying 3 3  Worry too much - different things 3 3  Trouble relaxing 3 3  Restless 3 3  Easily annoyed or irritable 3 3  Afraid - awful might happen 3 0  Total GAD 7 Score 21 18  Anxiety Difficulty Very difficult Very difficult     Social Functioning Social maturity: Social Maturity: Responsible Social judgement: Social Judgement: Normal  Stress Current stressors: Current Stressors: (Stopped taking her psychiatric medicine.) Familial stressors: Familial Stressors: None Sleep: Sleep: Increased, Sleeping during day Appetite: Appetite: (Varies depending on his mood) Coping ability: Coping ability: Exhausted, Overwhelmed, Deficient support system  Patient taking medications as prescribed: Patient taking medications as prescribed: (His prescription was called in today and he will pick up his medication tomorrow if he is able to leave the home. )  Current medications:  Outpatient Encounter Medications as of 10/24/2017  Medication Sig  . aspirin EC 81 MG tablet Take 81 mg by mouth daily.  Marland Kitchen atorvastatin (LIPITOR) 20 MG tablet Take 1 tablet  every OD  . citalopram (CELEXA) 20 MG tablet Take 1 tablet (20 mg total) by mouth daily.  . empagliflozin (JARDIANCE) 10 MG TABS tablet Take 10 mg by mouth daily.  Marland Kitchen lisinopril (PRINIVIL,ZESTRIL) 10 MG tablet Take 1 tablet (10 mg total) by mouth daily. (Patient not taking: Reported on 10/24/2017)  . metFORMIN (GLUCOPHAGE) 1000 MG tablet Take 1 tablet (1,000 mg total) by mouth 2 (two) times daily with a meal.  . naproxen (NAPROSYN) 500 MG tablet Take 1 tablet (500 mg total) by mouth 2 (two) times daily with a meal. (Patient not taking:  Reported on 2017-11-08)   No facility-administered encounter medications on file as of 11/08/2017.     Self-harm Behaviors Risk Assessment Self-harm risk factors: Self-harm risk factors: (NA) Patient endorses recent thoughts of harming self: Have you recently had any  thoughts about harming yourself?: No  [place] Suicide Severity Rating Scale:  C-SRSS 11-08-2017  1. Wish to be Dead No  2. Suicidal Thoughts No  6. Suicide Behavior Question No    Danger to Others Risk Assessment Danger to others risk factors: Danger to Others Risk Factors: No risk factors noted Patient endorses recent thoughts of harming others:      Substance Use Assessment Patient recently consumed alcohol: Have you recently consumed alcohol?: No  Alcohol Use Disorder Identification Test (AUDIT):  Alcohol Use Disorder Test (AUDIT) 11-08-17  1. How often do you have a drink containing alcohol? 0  2. How many drinks containing alcohol do you have on a typical day when you are drinking? 0  3. How often do you have six or more drinks on one occasion? 0  AUDIT-C Score 0  Intervention/Follow-up Medication Offered/Refused   Patient recently used drugs: Have you recently used any drugs?: No Patient is concerned about dependence or abuse of substances: Does patient seem concerned about dependence or abuse of any substance?: No     Goals, Interventions and Follow-up Plan Goals: Increase healthy adjustment to current life circumstances Interventions: Motivational Interviewing, Solution-Focused Strategies, Mindfulness or Psychologist, educational, Behavioral Activation, Brief CBT, Supportive Counseling and Medication Monitoring Follow-up Plan: Patient declined VBH services.   Summary of Clinical Assessment Summary:   Patient is a 67 year old male.  Patient was referred because he has been off his psychiatric medication for a year because he did not have health care insurance.   Stress:  Patient reports that he has extreme anxiety and he is afraid to leave the home at times.   Patient reports that it took him 4 hours to leave his home to come to his doctor's appt.  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   Patient reports a history of anxiety and depression.  Patient is a widowed since in 1986 and he does not have any children.   Patient reports that he lives alone and he does not have any family or friend supports.  Patient reports that at times his landlord will run errands for him when he is not able to leave the house.   Patient denies inpatient psychiatric hospitalization.  Patient reports outpatient therapy in 1986.    Patient reports that he feels useless and nothing makes him feel happy.  Patient reports that he was being, civil but he does not want VBH services. Writer informed the patient that the information will be relayed back to his PCP and I will make sure that he will not receive any more calls from [facility].    Graciella Freer [place], LCAS-A

## 2017-10-24 NOTE — Progress Notes (Signed)
  Subjective:   Patient ID: Philip [name], male    DOB: 08/19/1950, 67 y.o.   MRN: [id number] CC: No chief complaint on file.  HPI: Philip [name] is a 67 y.o. male   DM2: BGLs at home 160s to 200s. Rarely 300s. egg for breakfast, salad for lunch, dinner varies, beans sometimes. Has been taking metformin regularly, not any other medicines.  Depression: no thoughts of self harm, mood has been down. No longer working. Has hard time leaving the house. Claustrophobia and fear of people have kept him inside.  HLD: no trouble tolerating statin.  HTN: not taking lisinopril, BP at home [id number] systolics  Relevant past medical, surgical, family and social history reviewed. Allergies and medications reviewed and updated. Social History   Tobacco Use  Smoking Status Current Some Day Smoker  . Packs/day: 0.25  Smokeless Tobacco Never Used   ROS: Per HPI   Objective:    BP 133/84   Pulse 95   Temp 98.5 F (36.9 C) (Oral)   Resp 20   Ht 5' 8.5" (1.74 m)   Wt 219 lb (99.3 kg)   SpO2 98%   BMI 32.81 kg/m   Wt Readings from Last 3 Encounters:  10/24/17 219 lb (99.3 kg)  08/16/16 221 lb 3.2 oz (100.3 kg)  04/28/16 214 lb 12.8 oz (97.4 kg)    Gen: NAD, alert, cooperative with exam, NCAT EYES: EOMI, no conjunctival injection, or no icterus ENT:  TMs pearly gray b/l, OP without erythema LYMPH: no cervical LAD CV: NRRR, normal S1/S2, no murmur, distal pulses 2+ b/l Resp: CTABL, no wheezes, normal WOB Abd: +BS, soft, NTND. no guarding or organomegaly Ext: No edema, warm Neuro: Alert and oriented, strength equal b/l UE and LE, coordination grossly normal MSK: normal muscle bulk Psych: anxious appearing  Assessment & Plan:  Diagnoses and all orders for this visit:  Type 2 diabetes mellitus without complication, without long-term current use of insulin (HCC) A1c 10, restart jardiance. Avoid carbohydrate heavy meals -     Bayer DCA Hb A1c Waived -     empagliflozin (JARDIANCE) 10 MG  TABS tablet; Take 10 mg by mouth daily. -     metFORMIN (GLUCOPHAGE) 1000 MG tablet; Take 1 tablet (1,000 mg total) by mouth 2 (two) times daily with a meal.  Hyperlipidemia, unspecified hyperlipidemia type Restart below -     atorvastatin (LIPITOR) 20 MG tablet; Take 1 tablet  every OD  Depression, unspecified depression type Restart below, referral to vBH for counseling -     citalopram (CELEXA) 20 MG tablet; Take 1 tablet (20 mg total) by mouth daily.  Essential hypertension Adequate control off of BP med, will cont to follow -     CMP14+EGFR  Anxiety -     TSH   Follow up plan: Return in about 1 month (around 11/21/2017). Assunta Found, MD [place]

## 2017-10-25 LAB — CMP14+EGFR
ALT: 40 IU/L (ref 0–44)
AST: 34 IU/L (ref 0–40)
Albumin/Globulin Ratio: 1.8 (ref 1.2–2.2)
Albumin: 4.2 g/dL (ref 3.6–4.8)
Alkaline Phosphatase: 112 IU/L (ref 39–117)
BUN/Creatinine Ratio: 11 (ref 10–24)
BUN: 9 mg/dL (ref 8–27)
Bilirubin Total: 0.3 mg/dL (ref 0.0–1.2)
CO2: 23 mmol/L (ref 20–29)
Calcium: 10.9 mg/dL — ABNORMAL HIGH (ref 8.6–10.2)
Chloride: 103 mmol/L (ref 96–106)
Creatinine, Ser: 0.85 mg/dL (ref 0.76–1.27)
GFR calc Af Amer: 105 mL/min/{1.73_m2} (ref >59)
GFR calc non Af Amer: 91 mL/min/{1.73_m2} (ref >59)
Globulin, Total: 2.4 g/dL (ref 1.5–4.5)
Glucose: 181 mg/dL — ABNORMAL HIGH (ref 65–99)
Potassium: 4.7 mmol/L (ref 3.5–5.2)
Sodium: 142 mmol/L (ref 134–144)
Total Protein: 6.6 g/dL (ref 6.0–8.5)

## 2017-10-25 LAB — TSH: TSH: 1.12 u[IU]/mL (ref 0.450–4.500)

## 2017-10-26 ENCOUNTER — Telehealth: Payer: Self-pay

## 2017-10-26 NOTE — Telephone Encounter (Signed)
Writer consulted with Dr. Modesta Messing.  The patient does not want VBH services. Patient will be placed on the inactive list.  Services can be provided again once a new consult is submitted.

## 2017-10-26 NOTE — Progress Notes (Signed)
[facility] Psychiatric Consultant Case Review   Summary Philip [name] is a 67 y.o. year old male with history of depression, anxiety, hypertension, diabetes, hyperlipidemia. Patient endorses significant anxiety and he rarely goes outside of home. Citalopram is restarted  By PCP.   Psychosocial factors: no social support, widower  Current Medications Current Outpatient Medications on File Prior to Visit  Medication Sig Dispense Refill  . aspirin EC 81 MG tablet Take 81 mg by mouth daily.    Marland Kitchen atorvastatin (LIPITOR) 20 MG tablet Take 1 tablet  every OD 90 tablet 3  . citalopram (CELEXA) 20 MG tablet Take 1 tablet (20 mg total) by mouth daily. 90 tablet 1  . empagliflozin (JARDIANCE) 10 MG TABS tablet Take 10 mg by mouth daily. 30 tablet 1  . lisinopril (PRINIVIL,ZESTRIL) 10 MG tablet Take 1 tablet (10 mg total) by mouth daily. (Patient not taking: Reported on 10/24/2017) 90 tablet 3  . metFORMIN (GLUCOPHAGE) 1000 MG tablet Take 1 tablet (1,000 mg total) by mouth 2 (two) times daily with a meal. 180 tablet 3  . naproxen (NAPROSYN) 500 MG tablet Take 1 tablet (500 mg total) by mouth 2 (two) times daily with a meal. (Patient not taking: Reported on 10/24/2017) 30 tablet 0   No current facility-administered medications on file prior to visit.      Past psychiatry history Outpatient: denies Psychiatry admission: denies Previous suicide attempt: denies Past trials of medication: citalopram History of violence: denies  Current measures Depression screen [facility] 2/9 10/24/2017 08/16/2016 07/22/2016 04/28/2016 10/28/2015  Decreased Interest 3 2 0 3 3  Down, Depressed, Hopeless 3 2 0 3 2  PHQ - 2 Score 6 4 0 6 5  Altered sleeping 2 2 - 3 2  Tired, decreased energy 3 3 - 1 3  Change in appetite 2 2 - 2 2  Feeling bad or failure about yourself  1 2 - 1 0  Trouble concentrating 2 1 - 2 0  Moving slowly or fidgety/restless 3 1 - 0 0  Suicidal thoughts 0 0 - 1 0  PHQ-9 Score  19 15 - 16 12  Difficult doing work/chores Somewhat difficult Somewhat difficult - - Very difficult   GAD 7 : Generalized Anxiety Score 10/24/2017 08/16/2016  Nervous, Anxious, on Edge 3 3  Control/stop worrying 3 3  Worry too much - different things 3 3  Trouble relaxing 3 3  Restless 3 3  Easily annoyed or irritable 3 3  Afraid - awful might happen 3 0  Total GAD 7 Score 21 18  Anxiety Difficulty Very difficult Very difficult    Goals (patient centered) n/a  Assessment/Provisional Diagnosis # MDD, unspecified anxiety  Although patient will greatly benefit from psychiatry services given severity of symptoms, the patient declined to continue [place]. Noted that the maximum dose of citalopram for age > 60  is 20 mg per day due to concern for QTc prolongation. Would recommend switch to other SSRI/SNRI or add adjunctive treatment (such as mirtazapine, antipsychotics) in the future if the patient has limited benefit from citalopram.   Recommendation - Continue citalopram 20 mg at this time.  - The patient declines to continue [place] service. Will sign off.  Please contact [place]  for any questions or concerns.   The above treatment considerations and suggestions are based on consultation with the [facility] specialist and/or PCP and a review of information available in the shared registry and the patient's [facility] Record (EHR). I have  not personally examined the patient. All recommendations should be implemented with consideration of the patient's relevant prior history and current clinical status. Please feel free to call me with any questions about the care of this patient.

## 2017-11-24 ENCOUNTER — Ambulatory Visit (INDEPENDENT_AMBULATORY_CARE_PROVIDER_SITE_OTHER): Payer: Medicare HMO | Admitting: Pediatrics

## 2017-11-24 ENCOUNTER — Encounter: Payer: Self-pay | Admitting: Pediatrics

## 2017-11-24 VITALS — BP 151/95 | HR 102 | Temp 97.6°F | Ht 68.5 in | Wt 215.6 lb

## 2017-11-24 DIAGNOSIS — I10 Essential (primary) hypertension: Secondary | ICD-10-CM | POA: Diagnosis not present

## 2017-11-24 DIAGNOSIS — F329 Major depressive disorder, single episode, unspecified: Secondary | ICD-10-CM | POA: Diagnosis not present

## 2017-11-24 DIAGNOSIS — E119 Type 2 diabetes mellitus without complications: Secondary | ICD-10-CM | POA: Diagnosis not present

## 2017-11-24 DIAGNOSIS — F32A Depression, unspecified: Secondary | ICD-10-CM

## 2017-11-24 MED ORDER — LISINOPRIL 20 MG PO TABS: 20.0000 mg | ORAL_TABLET | Freq: Every day | ORAL | 1 refills | Status: DC

## 2017-11-24 MED ORDER — CITALOPRAM HYDROBROMIDE 40 MG PO TABS: 40.0000 mg | ORAL_TABLET | Freq: Every day | ORAL | 1 refills | Status: DC

## 2017-11-24 NOTE — Progress Notes (Signed)
  Subjective:   Patient ID: Philip [name], male    DOB: October 20, 1950, 67 y.o.   MRN: [id number] CC: Blood Sugar Check (1 month)  HPI: Philip [name] is a 67 y.o. male   DM2: stopped jardiance. BGLs in AM have been low 100s. Eating more vegetables. Avoiding sugary foods.  HTN: no CP, HA or SOB. Taking med regularly.  Anxiety: some ongoing symptoms, didn't like vBH, feels safe at home, no thoughts of self harm. 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  Relevant past medical, surgical, family and social history reviewed. Allergies and medications reviewed and updated. Social History   Tobacco Use  Smoking Status Current Some Day Smoker  . Packs/day: 0.25  Smokeless Tobacco Never Used   ROS: Per HPI   Objective:    BP (!) 151/95   Pulse (!) 102   Temp 97.6 F (36.4 C) (Oral)   Ht 5' 8.5" (1.74 m)   Wt 215 lb 9.6 oz (97.8 kg)   BMI 32.31 kg/m   Wt Readings from Last 3 Encounters:  11/24/17 215 lb 9.6 oz (97.8 kg)  10/24/17 219 lb (99.3 kg)  08/16/16 221 lb 3.2 oz (100.3 kg)    Gen: NAD, alert, cooperative with exam, NCAT EYES: EOMI, no conjunctival injection, or no icterus ENT: OP without erythema LYMPH: no cervical LAD CV: NRRR, normal S1/S2, no murmur, distal pulses 2+ b/l Resp: CTABL, no wheezes, normal WOB Abd: +BS, soft, NTND.  Ext: No edema, warm Neuro: Alert and oriented, strength equal b/l UE and LE, coordination grossly normal MSK: normal muscle bulk Psych: normal affect, no thoughts of self harm, mood down at times  Assessment & Plan:  Philip [name] was seen today for blood sugar check.  Diagnoses and all orders for this visit:  Type 2 diabetes mellitus without complication, without long-term current use of insulin (HCC) BGLs improved in AM. Cont diet changes, A1c next visit.  Essential hypertension Elevated, increase lisinopril. Check at home, let me know if remains elevated.  -     lisinopril (PRINIVIL,ZESTRIL) 20 MG tablet; Take 1 tablet (20 mg  total) by mouth daily.  Depression, unspecified depression type Some ongoing symptoms, will increase to 40mg  -     citalopram (CELEXA) 40 MG tablet; Take 1 tablet (40 mg total) by mouth daily.   Follow up plan: Return in about 2 months (around 01/25/2018). Assunta Found, MD [place]

## 2017-12-23 ENCOUNTER — Encounter: Payer: Self-pay | Admitting: Pediatrics

## 2017-12-23 ENCOUNTER — Ambulatory Visit (INDEPENDENT_AMBULATORY_CARE_PROVIDER_SITE_OTHER): Payer: Medicare HMO | Admitting: Pediatrics

## 2017-12-23 VITALS — BP 156/93 | HR 93 | Temp 98.4°F | Resp 20 | Ht 68.5 in | Wt 213.0 lb

## 2017-12-23 DIAGNOSIS — L989 Disorder of the skin and subcutaneous tissue, unspecified: Secondary | ICD-10-CM

## 2017-12-23 DIAGNOSIS — Z0000 Encounter for general adult medical examination without abnormal findings: Secondary | ICD-10-CM | POA: Diagnosis not present

## 2017-12-23 DIAGNOSIS — Z1211 Encounter for screening for malignant neoplasm of colon: Secondary | ICD-10-CM

## 2017-12-23 DIAGNOSIS — E119 Type 2 diabetes mellitus without complications: Secondary | ICD-10-CM

## 2017-12-23 NOTE — Progress Notes (Signed)
Subjective:   Philip [name] is a 67 y.o. male who presents for a Welcome to Medicare exam.   Has been off of HTN meds for a week because he has been out of town. No HA, no CP. Usually 130s-140s/80s on medicines at home. Has had some nasal congestion and cough past few days. No fevers, appetite has been fine. Mood has been ok.   Review of Systems: All systems negative other than what is in HPI      Objective:    Today's Vitals   12/23/17 1448 12/23/17 1501  BP: (!) 150/97 (!) 156/93  Pulse: 90 93  Resp:  20  Temp: 98.4 F (36.9 C)   TempSrc: Oral   SpO2:  97%  Weight: 213 lb (96.6 kg)   Height: 5' 8.5" (1.74 m)    Body mass index is 31.92 kg/m.  Medications Outpatient Encounter Medications as of 12/23/2017  Medication Sig  . aspirin EC 81 MG tablet Take 81 mg by mouth daily.  Marland Kitchen atorvastatin (LIPITOR) 20 MG tablet Take 1 tablet  every OD  . citalopram (CELEXA) 40 MG tablet Take 1 tablet (40 mg total) by mouth daily.  Marland Kitchen lisinopril (PRINIVIL,ZESTRIL) 20 MG tablet Take 1 tablet (20 mg total) by mouth daily.  . metFORMIN (GLUCOPHAGE) 1000 MG tablet Take 1 tablet (1,000 mg total) by mouth 2 (two) times daily with a meal.  . naproxen (NAPROSYN) 500 MG tablet Take 1 tablet (500 mg total) by mouth 2 (two) times daily with a meal.   No facility-administered encounter medications on file as of 12/23/2017.      History: Past Medical History:  Diagnosis Date  . Chest pain   . Diabetes mellitus without complication ([place])   . Hyperlipidemia   . Hypertension   . PTSD (post-traumatic stress disorder)    Past Surgical History:  Procedure Laterality Date  . [place]   hernitated and ruptured discs  . LUMBAR LAMINECTOMY/DECOMPRESSION MICRODISCECTOMY Right 08/01/2014   Procedure: Right L/4-5 Extraforaminal Diskectomy;  Surgeon: Eustace Moore, MD;  Location: [facility];  Service: Neurosurgery;  Laterality: Right;  Right L/4-5 Extraforaminal Diskectomy  . TONSILLECTOMY    .  TONSILLECTOMY      Family History  Adopted: Yes   Social History   Occupational History  . Occupation: Retired  Tobacco Use  . Smoking status: Current Some Day Smoker    Packs/day: 0.50    Years: 53.00    Pack years: 26.50  . Smokeless tobacco: Never Used  Substance and Sexual Activity  . Alcohol use: No  . Drug use: No  . Sexual activity: Not Currently   Tobacco Counseling Ready to quit: No Counseling given: No   Immunizations and Health Maintenance  There is no immunization history on file for this patient. Health Maintenance Due  Topic Date Due  . Hepatitis C Screening  1950/06/05  . OPHTHALMOLOGY EXAM  04/19/1997  . COLONOSCOPY  06/10/2001  . FOOT EXAM  02/02/2014    Activities of Daily Living In your present state of health, do you have any difficulty performing the following activities: 12/23/2017  Hearing? N  Vision? N  Difficulty concentrating or making decisions? Y  Walking or climbing stairs? N  Dressing or bathing? N  Doing errands, shopping? N  Some recent data might be hidden    Physical Exam   RESP: CTAB, nl WOB CV: NRRR, no murmur EXT: no edema  Advanced Directives:   Artis Delay, family friend  he would want to make decisions for him if he were not able    Assessment:    This is a routine wellness  examination for this patient .   Vision/Hearing screen No exam data present  Dietary issues and exercise activities discussed:  Current Exercise Habits: Structured exercise class, Type of exercise: strength training/weights, Time (Minutes): > 60, Frequency (Times/Week): 3, Weekly Exercise (Minutes/Week): 0  Goals    . Quit Smoking       Depression Screen PHQ 2/9 Scores 12/23/2017 11/24/2017 10/24/2017 08/16/2016  PHQ - 2 Score 3 4 6 4   PHQ- 9 Score 11 12 19 15      Fall Risk Fall Risk  12/23/2017  Falls in the past year? No  Number falls in past yr: -  Injury with Fall? -    Cognitive Function MMSE - Mini Mental State Exam 12/23/2017    Orientation to time 5  Orientation to Place 5  Registration 3  Attention/ Calculation 5  Recall 3  Language- name 2 objects 2  Language- repeat 1  Language- follow 3 step command 3  Language- read & follow direction 1  Write a sentence 1  Copy design 1  Total score 30        Patient Care Team: Eustaquio Maize, MD as PCP - General (Pediatrics)     Plan:   [phone] here for welcome to Medicare Exam and physical.  I have personally reviewed and noted the following in the patient's chart:   . Medical and social history . Use of alcohol, tobacco or illicit drugs, cutting down on cig, about 1/2 ppd . Current medications and supplements . Functional ability and status . Nutritional status . Physical activity . Advanced directives . List of other physicians . Hospitalizations, surgeries, and ER visits in previous 12 months . Vitals . Screenings to include cognitive, depression, and falls . Referrals and appointments  In addition, I have reviewed and discussed with patient certain preventive protocols, quality metrics, and best practice recommendations. A written personalized care plan for preventive services as well as general preventive health recommendations were provided to patient.    Eustaquio Maize, MD 12/23/2017

## 2018-01-25 ENCOUNTER — Ambulatory Visit (INDEPENDENT_AMBULATORY_CARE_PROVIDER_SITE_OTHER): Payer: Medicare HMO | Admitting: Pediatrics

## 2018-01-25 ENCOUNTER — Encounter: Payer: Self-pay | Admitting: Pediatrics

## 2018-01-25 VITALS — BP 135/79 | HR 85 | Temp 98.7°F | Ht 68.5 in | Wt 209.8 lb

## 2018-01-25 DIAGNOSIS — L989 Disorder of the skin and subcutaneous tissue, unspecified: Secondary | ICD-10-CM

## 2018-01-25 DIAGNOSIS — I10 Essential (primary) hypertension: Secondary | ICD-10-CM | POA: Diagnosis not present

## 2018-01-25 DIAGNOSIS — E785 Hyperlipidemia, unspecified: Secondary | ICD-10-CM | POA: Diagnosis not present

## 2018-01-25 DIAGNOSIS — E119 Type 2 diabetes mellitus without complications: Secondary | ICD-10-CM

## 2018-01-25 LAB — LIPID PANEL
Chol/HDL Ratio: 5.6 ratio — ABNORMAL HIGH (ref 0.0–5.0)
Cholesterol, Total: 223 mg/dL — ABNORMAL HIGH (ref 100–199)
HDL: 40 mg/dL (ref >39)
LDL Calculated: 142 mg/dL — ABNORMAL HIGH (ref 0–99)
Triglycerides: 206 mg/dL — ABNORMAL HIGH (ref 0–149)
VLDL Cholesterol Cal: 41 mg/dL — ABNORMAL HIGH (ref 5–40)

## 2018-01-25 LAB — BAYER DCA HB A1C WAIVED: HB A1C (BAYER DCA - WAIVED): 6.4 % (ref <7.0)

## 2018-01-25 LAB — CBC WITH DIFFERENTIAL/PLATELET
Basophils Absolute: 0.1 10*3/uL (ref 0.0–0.2)
Basos: 1 %
EOS (ABSOLUTE): 0.2 10*3/uL (ref 0.0–0.4)
Eos: 2 %
Hematocrit: 45.4 % (ref 37.5–51.0)
Hemoglobin: 15.6 g/dL (ref 13.0–17.7)
Immature Grans (Abs): 0 10*3/uL (ref 0.0–0.1)
Immature Granulocytes: 0 %
Lymphocytes Absolute: 3.4 10*3/uL — ABNORMAL HIGH (ref 0.7–3.1)
Lymphs: 39 %
MCH: 31.5 pg (ref 26.6–33.0)
MCHC: 34.4 g/dL (ref 31.5–35.7)
MCV: 92 fL (ref 79–97)
Monocytes Absolute: 0.8 10*3/uL (ref 0.1–0.9)
Monocytes: 10 %
Neutrophils Absolute: 4.2 10*3/uL (ref 1.4–7.0)
Neutrophils: 48 %
Platelets: 292 10*3/uL (ref 150–450)
RBC: 4.96 x10E6/uL (ref 4.14–5.80)
RDW: 13.3 % (ref 12.3–15.4)
WBC: 8.7 10*3/uL (ref 3.4–10.8)

## 2018-01-25 NOTE — Progress Notes (Signed)
  Subjective:   Patient ID: Philip [name], male    DOB: Oct 30, 1950, 67 y.o.   MRN: [id number] CC: Medical Management of Chronic Issues  HPI: Philip [name] is a 67 y.o. male   Diabetes: Avoiding sugary foods.  Taking medicines regularly.   Hyperlipidemia: Tolerating statin, no side effects.  Hypertension: Taking medicine regularly.  No chest pain or shortness of breath  Skin lesion: Small pearly papule behind right ear, has been present for several months.  No itching or bleeding.  Relevant past medical, surgical, family and social history reviewed. Allergies and medications reviewed and updated. Social History   Tobacco Use  Smoking Status Current Some Day Smoker  . Packs/day: 0.50  . Years: 53.00  . Pack years: 26.50  Smokeless Tobacco Never Used   ROS: Per HPI   Objective:    BP 135/79   Pulse 85   Temp 98.7 F (37.1 C) (Oral)   Ht 5' 8.5" (1.74 m)   Wt 209 lb 12.8 oz (95.2 kg)   BMI 31.44 kg/m   Wt Readings from Last 3 Encounters:  01/25/18 209 lb 12.8 oz (95.2 kg)  12/23/17 213 lb (96.6 kg)  11/24/17 215 lb 9.6 oz (97.8 kg)    Gen: NAD, alert, cooperative with exam, NCAT EYES: EOMI, no conjunctival injection, or no icterus ENT:  TMs pearly gray b/l, OP without erythema LYMPH: no cervical LAD CV: NRRR, normal S1/S2, no murmur, distal pulses 2+ b/l Resp: CTABL, no wheezes, normal WOB Abd: +BS, soft, NTND. no guarding or organomegaly Ext: No edema, warm Neuro: Alert and oriented, strength equal b/l UE and LE MSK: normal muscle bulk Skin: 4 mm x 3 mm pearly flesh-colored and red mottled papule just posterior to right ear.  Assessment & Plan:  Dickey was seen today for medical management of chronic issues.  Diagnoses and all orders for this visit:  Diabetes mellitus without complication (HCC) C1E 6.4.  Much improved from 10 at last visit.  Has been making dietary changes in addition to take metformin regularly. -     Bayer DCA Hb A1c Waived -     Microalbumin  / creatinine urine ratio -     CBC with Differential/Platelet -     Lipid panel  Hyperlipidemia, unspecified hyperlipidemia type Stable, continue current medicine -     Bayer DCA Hb A1c Waived -     Microalbumin / creatinine urine ratio -     CBC with Differential/Platelet -     Lipid panel  Skin lesion Concerning for basal cell carcinoma.  Will refer to dermatology -     Ambulatory referral to Dermatology  Essential hypertension Slightly elevated today.  Continue to check at home as able.  Let me know if persistently greater than 140 or greater than 90  Follow up plan: Return in about 6 months (around 07/27/2018). Assunta Found, MD [place]

## 2018-02-09 DIAGNOSIS — C4441 Basal cell carcinoma of skin of scalp and neck: Secondary | ICD-10-CM | POA: Diagnosis not present

## 2018-03-23 DIAGNOSIS — H5200 Hypermetropia, unspecified eye: Secondary | ICD-10-CM | POA: Diagnosis not present

## 2018-03-23 DIAGNOSIS — Z0100 Encounter for examination of eyes and vision without abnormal findings: Secondary | ICD-10-CM | POA: Diagnosis not present

## 2018-09-13 ENCOUNTER — Other Ambulatory Visit: Payer: Self-pay

## 2018-09-13 ENCOUNTER — Encounter: Payer: Self-pay | Admitting: Family Medicine

## 2018-09-13 ENCOUNTER — Telehealth: Payer: Self-pay | Admitting: Family Medicine

## 2018-09-13 ENCOUNTER — Ambulatory Visit (INDEPENDENT_AMBULATORY_CARE_PROVIDER_SITE_OTHER): Payer: Medicare HMO | Admitting: Family Medicine

## 2018-09-13 VITALS — BP 153/88 | HR 117 | Temp 98.9°F | Ht 68.0 in | Wt 215.0 lb

## 2018-09-13 DIAGNOSIS — L729 Follicular cyst of the skin and subcutaneous tissue, unspecified: Secondary | ICD-10-CM | POA: Diagnosis not present

## 2018-09-13 DIAGNOSIS — L089 Local infection of the skin and subcutaneous tissue, unspecified: Secondary | ICD-10-CM

## 2018-09-13 MED ORDER — CEPHALEXIN 500 MG PO CAPS: 500.0000 mg | ORAL_CAPSULE | Freq: Four times a day (QID) | ORAL | 0 refills | Status: AC

## 2018-09-13 NOTE — Progress Notes (Signed)
Subjective: CC: Cyst PCP: Dettinger, Philip Kaufmann, MD [name] is a 68 y.o. male presenting to clinic today for:  1.  Scalp cyst Patient reports a one-week history of a lump on the back of his scalp.  He reports that it gotten quite large but subsequently started getting a little bit smaller over the last day or so.  He has been applying warm compresses to the affected area, Neosporin.  This morning, he did wake up with quite a bit of pain in that area that seems to be radiating down into the neck and therefore he thought he should get checked out.  Denies any drainage, bleeding or preceding injury.  He does shave his head and thought maybe it started as a blocked follicle.   ROS: Per HPI  Allergies  Allergen Reactions  . Ativan [Lorazepam] Other (See Comments)    Caused patient not to remember several days.  Philip [name] [Vaccinium Angustifolium] Swelling  . Fish Oil   . Shellfish Allergy   . Ultram [Tramadol Hcl] Hives   Past Medical History:  Diagnosis Date  . Chest pain   . Diabetes mellitus without complication ([place])   . Hyperlipidemia   . Hypertension   . PTSD (post-traumatic stress disorder)     Current Outpatient Medications:  .  aspirin EC 81 MG tablet, Take 81 mg by mouth daily., Disp: , Rfl:  .  atorvastatin (LIPITOR) 20 MG tablet, Take 1 tablet  every OD, Disp: 90 tablet, Rfl: 3 .  citalopram (CELEXA) 40 MG tablet, Take 1 tablet (40 mg total) by mouth daily., Disp: 90 tablet, Rfl: 1 .  lisinopril (PRINIVIL,ZESTRIL) 20 MG tablet, Take 1 tablet (20 mg total) by mouth daily., Disp: 90 tablet, Rfl: 1 .  metFORMIN (GLUCOPHAGE) 1000 MG tablet, Take 1 tablet (1,000 mg total) by mouth 2 (two) times daily with a meal., Disp: 180 tablet, Rfl: 3 .  naproxen (NAPROSYN) 500 MG tablet, Take 1 tablet (500 mg total) by mouth 2 (two) times daily with a meal., Disp: 30 tablet, Rfl: 0 .  zinc gluconate 50 MG tablet, Take 50 mg by mouth daily., Disp: , Rfl:  Social History    Socioeconomic History  . Marital status: Single    Spouse name: Not on file  . Number of children: Not on file  . Years of education: senior college  . Highest education level: Bachelor's degree (e.g., BA, AB, BS)  Occupational History  . Occupation: Retired  Scientific laboratory technician  . Financial resource strain: Not hard at all  . Food insecurity:    Worry: Never true    Inability: Never true  . Transportation needs:    Medical: No    Non-medical: No  Tobacco Use  . Smoking status: Current Some Day Smoker    Packs/day: 0.50    Years: 53.00    Pack years: 26.50  . Smokeless tobacco: Never Used  Substance and Sexual Activity  . Alcohol use: No  . Drug use: No  . Sexual activity: Not Currently  Lifestyle  . Physical activity:    Days per week: 3 days    Minutes per session: 60 min  . Stress: Very much  Relationships  . Social connections:    Talks on phone: Never    Gets together: Never    Attends religious service: Never    Active member of club or organization: No    Attends meetings of clubs or organizations: Never    Relationship status: Separated  .  Intimate partner violence:    Fear of current or ex partner: No    Emotionally abused: No    Physically abused: No    Forced sexual activity: No  Other Topics Concern  . Not on file  Social History Narrative  . Not on file   Family History  Adopted: Yes    Objective: Office vital signs reviewed. BP (!) 153/88   Pulse (!) 117   Temp 98.9 F (37.2 C) (Oral)   Ht 5\' 8"  (1.727 m)   Wt 215 lb (97.5 kg)   BMI 32.69 kg/m   Physical Examination:  General: Awake, alert, well nourished, No acute distress Skin: Patient has a nickel sized cyst along the posterior aspect of the scalp near the lambdoid suture.  He has associated fluctuance, induration and erythema.  There is a visible punctum with associated scab formation over it.  He is quite tender to palpation.  Incision and drainage Verbal consent obtained for simple  incision and drainage.  Ethyl chloride was used to numb the area and sterilize the area and then a a 22-gauge needle was used to perform a stab incision.  No significant drainage obtained from the stab incision despite attempting milking technique.  No immediate complications.  Assessment/ Plan: 68 y.o. male   1. Infected cyst of skin Simple I&D was attempted but he had no significant drainage from treatment area.  I have placed him on oral Keflex 4 times daily for the next 10 days.  I encouraged him to continue warm compresses to the affected area.  We discussed reasons for reevaluation.  I have asked that he follow-up in the next 2 weeks to recheck the cyst and recheck blood pressure.  May need to consider referral to general surgery for outpatient excision of lesion. - cephALEXin (KEFLEX) 500 MG capsule; Take 1 capsule (500 mg total) by mouth 4 (four) times daily for 10 days.  Dispense: 40 capsule; Refill: 0   No orders of the defined types were placed in this encounter.  No orders of the defined types were placed in this encounter.    Janora Norlander, DO [place] [phone]

## 2018-09-13 NOTE — Telephone Encounter (Signed)
Patient aware he has follow up scheduled.

## 2018-09-13 NOTE — Patient Instructions (Signed)
Epidermal Cyst    An epidermal cyst is a small, painless lump under your skin. The cyst contains a grayish-white, bad-smelling substance (keratin). Do not try to pop or open an epidermal cyst yourself.  What are the causes?   A blocked hair follicle.   A hair that curls and re-enters the skin instead of growing straight out of the skin.   A blocked pore.   Irritated skin.   An injury to the skin.   Certain conditions that are passed along from parent to child (inherited).   Human papillomavirus (HPV).   Long-term sun damage to the skin.  What increases the risk?   Having acne.   Being overweight.   Being 30-40 years old.  What are the signs or symptoms?  These cysts are usually harmless, but they can get infected. Symptoms of infection may include:   Redness.   Inflammation.   Tenderness.   Warmth.   Fever.   A grayish-white, bad-smelling substance drains from the cyst.   Pus drains from the cyst.  How is this treated?  In many cases, epidermal cysts go away on their own without treatment. If a cyst becomes infected, treatment may include:   Opening and draining the cyst, done by a doctor. After draining, you may need minor surgery to remove the rest of the cyst.   Antibiotic medicine.   Shots of medicines (steroids) that help to reduce inflammation.   Surgery to remove the cyst. Surgery may be done if the cyst:  ? Becomes large.  ? Bothers you.  ? Has a chance of turning into cancer.   Do not try to open a cyst yourself.  Follow these instructions at home:   Take over-the-counter and prescription medicines only as told by your doctor.   If you were prescribed an antibiotic medicine, take it it as told by your doctor. Do not stop using the antibiotic even if you start to feel better.   Keep the area around your cyst clean and dry.   Wear loose, dry clothing.   Avoid touching your cyst.   Check your cyst every day for signs of infection. Check for:  ? Redness, swelling, or pain.  ? Fluid  or blood.  ? Warmth.  ? Pus or a bad smell.   Keep all follow-up visits as told by your doctor. This is important.  How is this prevented?   Wear clean, dry, clothing.   Avoid wearing tight clothing.   Keep your skin clean and dry. Take showers or baths every day.  Contact a doctor if:   Your cyst has symptoms of infection.   Your condition does not improve or gets worse.   You have a cyst that looks different from other cysts you have had.   You have a fever.  Get help right away if:   Redness spreads from the cyst into the area close by.  Summary   An epidermal cyst is a sac made of skin tissue.   If a cyst becomes infected, treatment may include surgery to open and drain the cyst, or to remove it.   Take over-the-counter and prescription medicines only as told by your doctor.   Contact a doctor if your condition is not improving or is getting worse.   Keep all follow-up visits as told by your doctor. This is important.  This information is not intended to replace advice given to you by your health care provider. Make sure you discuss any   questions you have with your health care provider.  Document Released: 05/13/2004 Document Revised: 10/17/2017 Document Reviewed: 02/05/2015  Elsevier Interactive Patient Education  2019 Elsevier Inc[employer]

## 2018-09-19 ENCOUNTER — Ambulatory Visit: Payer: Medicare HMO | Admitting: Pediatrics

## 2018-09-26 ENCOUNTER — Other Ambulatory Visit: Payer: Self-pay

## 2018-09-27 ENCOUNTER — Ambulatory Visit (INDEPENDENT_AMBULATORY_CARE_PROVIDER_SITE_OTHER): Payer: Medicare HMO | Admitting: Family Medicine

## 2018-09-27 VITALS — BP 148/79 | HR 98 | Temp 98.8°F | Ht 68.0 in | Wt 216.0 lb

## 2018-09-27 DIAGNOSIS — L729 Follicular cyst of the skin and subcutaneous tissue, unspecified: Secondary | ICD-10-CM | POA: Diagnosis not present

## 2018-09-27 NOTE — Progress Notes (Signed)
Subjective: CC: 2-week follow-up for scalp cyst PCP: Dettinger, Fransisca Kaufmann, MD KDT:OIZTI Philip [name] is a 68 y.o. male presenting to clinic today for:  1.  Scalp cyst Patient was seen 09/13/2018 for an infected scalp cyst.  He was placed on Keflex but notes that his pharmacy actually did not fill the medication until last week.  He never picked the medicine up but instead performed 4-5 times daily warm compresses to the scalp after the I&D.  He notes that he did have some serous and bloody drainage and that the cyst flattened out and tenderness resolved.  He has been doing well and notes that there is a small scab but no significant pain or drainage now.   ROS: Per HPI  Allergies  Allergen Reactions  . Ativan [Lorazepam] Other (See Comments)    Caused patient not to remember several days.  Marland Kitchen Blueberry [Vaccinium Angustifolium] Swelling  . Fish Oil   . Shellfish Allergy   . Ultram [Tramadol Hcl] Hives   Past Medical History:  Diagnosis Date  . Chest pain   . Diabetes mellitus without complication ([place])   . Hyperlipidemia   . Hypertension   . PTSD (post-traumatic stress disorder)     Current Outpatient Medications:  .  aspirin EC 81 MG tablet, Take 81 mg by mouth daily., Disp: , Rfl:  .  atorvastatin (LIPITOR) 20 MG tablet, Take 1 tablet  every OD, Disp: 90 tablet, Rfl: 3 .  citalopram (CELEXA) 40 MG tablet, Take 1 tablet (40 mg total) by mouth daily., Disp: 90 tablet, Rfl: 1 .  lisinopril (PRINIVIL,ZESTRIL) 20 MG tablet, Take 1 tablet (20 mg total) by mouth daily., Disp: 90 tablet, Rfl: 1 .  metFORMIN (GLUCOPHAGE) 1000 MG tablet, Take 1 tablet (1,000 mg total) by mouth 2 (two) times daily with a meal., Disp: 180 tablet, Rfl: 3 .  zinc gluconate 50 MG tablet, Take 50 mg by mouth daily., Disp: , Rfl:  .  naproxen (NAPROSYN) 500 MG tablet, Take 1 tablet (500 mg total) by mouth 2 (two) times daily with a meal. (Patient not taking: Reported on 09/27/2018), Disp: 30 tablet, Rfl: 0 Social  History   Socioeconomic History  . Marital status: Single    Spouse name: Not on file  . Number of children: Not on file  . Years of education: senior college  . Highest education level: Bachelor's degree (e.g., BA, AB, BS)  Occupational History  . Occupation: Retired  Scientific laboratory technician  . Financial resource strain: Not hard at all  . Food insecurity:    Worry: Never true    Inability: Never true  . Transportation needs:    Medical: No    Non-medical: No  Tobacco Use  . Smoking status: Current Some Day Smoker    Packs/day: 0.50    Years: 53.00    Pack years: 26.50  . Smokeless tobacco: Never Used  Substance and Sexual Activity  . Alcohol use: No  . Drug use: No  . Sexual activity: Not Currently  Lifestyle  . Physical activity:    Days per week: 3 days    Minutes per session: 60 min  . Stress: Very much  Relationships  . Social connections:    Talks on phone: Never    Gets together: Never    Attends religious service: Never    Active member of club or organization: No    Attends meetings of clubs or organizations: Never    Relationship status: Separated  . Intimate partner  violence:    Fear of current or ex partner: No    Emotionally abused: No    Physically abused: No    Forced sexual activity: No  Other Topics Concern  . Not on file  Social History Narrative  . Not on file   Family History  Adopted: Yes    Objective: Office vital signs reviewed. BP (!) 148/79   Pulse 98   Temp 98.8 F (37.1 C) (Oral)   Ht 5\' 8"  (1.727 m)   Wt 216 lb (98 kg)   BMI 32.84 kg/m   Physical Examination:  General: Awake, alert, well nourished, No acute distress Skin: Scalp with a small scab and some postinflammatory changes of the skin surrounding.  However, erythema, warmth and induration have totally resolved.  Assessment/ Plan: 68 y.o. male   1. Scalp cyst Symptoms have totally resolved.  We discussed that if he were to have recurrence of inflammation, pain and  drainage that this would be an indication for possible surgical referral to excise cyst.  I have encouraged him to contact us if he has any further needs.  No need to start the antibiotic given resolution of symptoms.  May resume normal activity.   No orders of the defined types were placed in this encounter.  No orders of the defined types were placed in this encounter.    Janora Norlander, DO [place] [phone]

## 2018-10-20 ENCOUNTER — Telehealth: Payer: Self-pay | Admitting: *Deleted

## 2018-10-20 ENCOUNTER — Ambulatory Visit (INDEPENDENT_AMBULATORY_CARE_PROVIDER_SITE_OTHER): Payer: Medicare HMO | Admitting: Family Medicine

## 2018-10-20 ENCOUNTER — Telehealth: Payer: Self-pay | Admitting: Family Medicine

## 2018-10-20 ENCOUNTER — Other Ambulatory Visit: Payer: Self-pay

## 2018-10-20 ENCOUNTER — Encounter: Payer: Self-pay | Admitting: Family Medicine

## 2018-10-20 VITALS — BP 133/74 | HR 100 | Temp 98.0°F | Ht 68.0 in | Wt 212.2 lb

## 2018-10-20 DIAGNOSIS — E785 Hyperlipidemia, unspecified: Secondary | ICD-10-CM

## 2018-10-20 DIAGNOSIS — I10 Essential (primary) hypertension: Secondary | ICD-10-CM | POA: Diagnosis not present

## 2018-10-20 DIAGNOSIS — E119 Type 2 diabetes mellitus without complications: Secondary | ICD-10-CM | POA: Diagnosis not present

## 2018-10-20 DIAGNOSIS — Z021 Encounter for pre-employment examination: Secondary | ICD-10-CM

## 2018-10-20 DIAGNOSIS — Z20822 Contact with and (suspected) exposure to covid-19: Secondary | ICD-10-CM

## 2018-10-20 DIAGNOSIS — E559 Vitamin D deficiency, unspecified: Secondary | ICD-10-CM | POA: Diagnosis not present

## 2018-10-20 LAB — URINALYSIS, COMPLETE
Bilirubin, UA: NEGATIVE
Glucose, UA: NEGATIVE
Ketones, UA: NEGATIVE
Leukocytes,UA: NEGATIVE
Nitrite, UA: NEGATIVE
Protein,UA: NEGATIVE
RBC, UA: NEGATIVE
Specific Gravity, UA: 1.01 (ref 1.005–1.030)
Urobilinogen, Ur: 5.5 mg/dL — ABNORMAL HIGH (ref 0.2–1.0)
pH, UA: 5.5 (ref 5.0–7.5)

## 2018-10-20 LAB — MICROSCOPIC EXAMINATION
Bacteria, UA: NONE SEEN
Epithelial Cells (non renal): NONE SEEN /hpf (ref 0–10)
RBC, Urine: NONE SEEN /hpf (ref 0–2)
Renal Epithel, UA: NONE SEEN /hpf
WBC, UA: NONE SEEN /hpf (ref 0–5)

## 2018-10-20 MED ORDER — ATORVASTATIN CALCIUM 20 MG PO TABS: ORAL_TABLET | ORAL | 3 refills | Status: DC

## 2018-10-20 MED ORDER — LISINOPRIL 20 MG PO TABS: 20.0000 mg | ORAL_TABLET | Freq: Every day | ORAL | 3 refills | Status: DC

## 2018-10-20 MED ORDER — METFORMIN HCL 1000 MG PO TABS: 1000.0000 mg | ORAL_TABLET | Freq: Two times a day (BID) | ORAL | 3 refills | Status: DC

## 2018-10-20 NOTE — Telephone Encounter (Signed)
Pt called and scheduled testing at [facility] site on 10/23/18. Pt advised to wear a mask and remain in car at appt time. Pt verbalized understanding.

## 2018-10-20 NOTE — Progress Notes (Signed)
BP 133/74   Pulse 100   Temp 98 F (36.7 C) (Oral)   Ht 5' 8"  (1.727 m)   Wt 212 lb 3.2 oz (96.3 kg)   SpO2 96%   BMI 32.26 kg/m    Subjective:   Patient ID: Philip [name], male    DOB: 12/26/1950, 68 y.o.   MRN: [id number]  HPI: Philip [name] is a 68 y.o. male presenting on 10/20/2018 for [place] Evette Doffing pt) and Diabetes (check up of chronic medical conditions)   HPI Type 2 diabetes mellitus Patient comes in today for recheck of his diabetes. Patient has been currently taking metformin. Patient is currently on an ACE inhibitor/ARB. Patient has not seen an ophthalmologist this year. Patient denies any issues with their feet except to toenails on both of his great toes that are thickened and yellowed.   Hypertension Patient is currently on lisinopril, and their blood pressure today is 133/74. Patient denies any lightheadedness or dizziness. Patient denies headaches, blurred vision, chest pains, shortness of breath, or weakness. Denies any side effects from medication and is content with current medication.   Hyperlipidemia Patient is coming in for recheck of his hyperlipidemia. The patient is currently taking atorvastatin. They deny any issues with myalgias or history of liver damage from it. They deny any focal numbness or weakness or chest pain.   Patient has vitamin D deficiency and needs a recheck.  Relevant past medical, surgical, family and social history reviewed and updated as indicated. Interim medical history since our last visit reviewed. Allergies and medications reviewed and updated.  Review of Systems  Constitutional: Negative for chills and fever.  Eyes: Negative for pain, discharge and visual disturbance.  Respiratory: Negative for cough, shortness of breath and wheezing.   Cardiovascular: Negative for chest pain, palpitations and leg swelling.  Gastrointestinal: Negative for abdominal pain, blood in stool, constipation and diarrhea.  Musculoskeletal:  Negative for back pain, gait problem and myalgias.  Skin: Negative for rash.  Neurological: Negative for dizziness, weakness and headaches.  Psychiatric/Behavioral: Negative for suicidal ideas.  All other systems reviewed and are negative.   Per HPI unless specifically indicated above   Allergies as of 10/20/2018      Reactions   Ativan [lorazepam] Other (See Comments)   Caused patient not to remember several days.   Blueberry [vaccinium Angustifolium] Swelling   Fish Oil    Shellfish Allergy    Ultram [tramadol Hcl] Hives      Medication List       Accurate as of October 20, 2018  8:27 AM. If you have any questions, ask your nurse or doctor.        STOP taking these medications   citalopram 40 MG tablet Commonly known as: CELEXA Stopped by: Fransisca Kaufmann Dettinger, MD   naproxen 500 MG tablet Commonly known as: Naprosyn Stopped by: Worthy Rancher, MD   zinc gluconate 50 MG tablet Stopped by: Fransisca Kaufmann Dettinger, MD     TAKE these medications   aspirin EC 81 MG tablet Take 81 mg by mouth daily.   atorvastatin 20 MG tablet Commonly known as: LIPITOR Take 1 tablet  every OD   lisinopril 20 MG tablet Commonly known as: ZESTRIL Take 1 tablet (20 mg total) by mouth daily.   metFORMIN 1000 MG tablet Commonly known as: GLUCOPHAGE Take 1 tablet (1,000 mg total) by mouth 2 (two) times daily with a meal.        Objective:   BP 133/74  Pulse 100   Temp 98 F (36.7 C) (Oral)   Ht 5' 8"  (1.727 m)   Wt 212 lb 3.2 oz (96.3 kg)   SpO2 96%   BMI 32.26 kg/m   Wt Readings from Last 3 Encounters:  10/20/18 212 lb 3.2 oz (96.3 kg)  09/27/18 216 lb (98 kg)  09/13/18 215 lb (97.5 kg)    Physical Exam Vitals signs and nursing note reviewed.  Constitutional:      General: He is not in acute distress.    Appearance: He is well-developed. He is not diaphoretic.  Eyes:     General: No scleral icterus.       Right eye: No discharge.     Conjunctiva/sclera: Conjunctivae  normal.     Pupils: Pupils are equal, round, and reactive to light.  Neck:     Musculoskeletal: Neck supple.     Thyroid: No thyromegaly.  Cardiovascular:     Rate and Rhythm: Normal rate and regular rhythm.     Heart sounds: Normal heart sounds. No murmur.  Pulmonary:     Effort: Pulmonary effort is normal. No respiratory distress.     Breath sounds: Normal breath sounds. No wheezing.  Musculoskeletal: Normal range of motion.  Lymphadenopathy:     Cervical: No cervical adenopathy.  Skin:    General: Skin is warm and dry.     Findings: No rash.  Neurological:     Mental Status: He is alert and oriented to person, place, and time.     Coordination: Coordination normal.  Psychiatric:        Behavior: Behavior normal.       Assessment & Plan:   Problem List Items Addressed This Visit      Cardiovascular and Mediastinum   Hypertension   Relevant Medications   lisinopril (ZESTRIL) 20 MG tablet   atorvastatin (LIPITOR) 20 MG tablet   Other Relevant Orders   CBC with Differential/Platelet   CMP14+EGFR   Urinalysis, Complete   Microalbumin / creatinine urine ratio     Endocrine   Diabetes (HCC)   Relevant Medications   lisinopril (ZESTRIL) 20 MG tablet   atorvastatin (LIPITOR) 20 MG tablet   metFORMIN (GLUCOPHAGE) 1000 MG tablet   Other Relevant Orders   CBC with Differential/Platelet   Bayer DCA Hb A1c Waived   Urinalysis, Complete   Microalbumin / creatinine urine ratio     Other   Vitamin D deficiency - Primary   Relevant Orders   VITAMIN D 25 Hydroxy (Vit-D Deficiency, Fractures)   Hyperlipidemia   Relevant Medications   lisinopril (ZESTRIL) 20 MG tablet   atorvastatin (LIPITOR) 20 MG tablet   Other Relevant Orders   Lipid panel      Patient would like to be COVID testing, he is currently asymptomatic and has no exposure but 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  Continue current medications and will check  blood work, he will come back on Monday to check blood work because her lab is closed today.  Follow up plan: Return in about 6 months (around 04/22/2019), or if symptoms worsen or fail to improve, for Diabetes and hypertension recheck.  Counseling provided for all of the vaccine components No orders of the defined types were placed in this encounter.   Caryl Pina, MD [facility] 10/20/2018, 8:27 AM

## 2018-10-20 NOTE — Telephone Encounter (Signed)
Referral has been placed for the patient. 

## 2018-10-20 NOTE — Telephone Encounter (Signed)
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

## 2018-10-23 ENCOUNTER — Other Ambulatory Visit: Payer: Self-pay

## 2018-10-23 ENCOUNTER — Other Ambulatory Visit: Payer: Medicare HMO

## 2018-10-23 ENCOUNTER — Other Ambulatory Visit: Payer: BLUE CROSS/BLUE SHIELD

## 2018-10-23 DIAGNOSIS — R6889 Other general symptoms and signs: Secondary | ICD-10-CM | POA: Diagnosis not present

## 2018-10-23 DIAGNOSIS — E785 Hyperlipidemia, unspecified: Secondary | ICD-10-CM | POA: Diagnosis not present

## 2018-10-23 DIAGNOSIS — I10 Essential (primary) hypertension: Secondary | ICD-10-CM | POA: Diagnosis not present

## 2018-10-23 DIAGNOSIS — E119 Type 2 diabetes mellitus without complications: Secondary | ICD-10-CM | POA: Diagnosis not present

## 2018-10-23 DIAGNOSIS — E559 Vitamin D deficiency, unspecified: Secondary | ICD-10-CM

## 2018-10-23 DIAGNOSIS — Z20822 Contact with and (suspected) exposure to covid-19: Secondary | ICD-10-CM

## 2018-10-23 LAB — BAYER DCA HB A1C WAIVED: HB A1C (BAYER DCA - WAIVED): 10.1 % — ABNORMAL HIGH (ref <7.0)

## 2018-10-24 LAB — CMP14+EGFR
ALT: 13 IU/L (ref 0–44)
AST: 14 IU/L (ref 0–40)
Albumin/Globulin Ratio: 2 (ref 1.2–2.2)
Albumin: 4.3 g/dL (ref 3.8–4.8)
Alkaline Phosphatase: 95 IU/L (ref 39–117)
BUN/Creatinine Ratio: 9 — ABNORMAL LOW (ref 10–24)
BUN: 8 mg/dL (ref 8–27)
Bilirubin Total: 0.5 mg/dL (ref 0.0–1.2)
CO2: 21 mmol/L (ref 20–29)
Calcium: 10.7 mg/dL — ABNORMAL HIGH (ref 8.6–10.2)
Chloride: 105 mmol/L (ref 96–106)
Creatinine, Ser: 0.87 mg/dL (ref 0.76–1.27)
GFR calc Af Amer: 103 mL/min/{1.73_m2} (ref >59)
GFR calc non Af Amer: 89 mL/min/{1.73_m2} (ref >59)
Globulin, Total: 2.1 g/dL (ref 1.5–4.5)
Glucose: 211 mg/dL — ABNORMAL HIGH (ref 65–99)
Potassium: 4.5 mmol/L (ref 3.5–5.2)
Sodium: 139 mmol/L (ref 134–144)
Total Protein: 6.4 g/dL (ref 6.0–8.5)

## 2018-10-24 LAB — CBC WITH DIFFERENTIAL/PLATELET
Basophils Absolute: 0 10*3/uL (ref 0.0–0.2)
Basos: 1 %
EOS (ABSOLUTE): 0.3 10*3/uL (ref 0.0–0.4)
Eos: 4 %
Hematocrit: 43.8 % (ref 37.5–51.0)
Hemoglobin: 14.4 g/dL (ref 13.0–17.7)
Immature Grans (Abs): 0 10*3/uL (ref 0.0–0.1)
Immature Granulocytes: 0 %
Lymphocytes Absolute: 3.1 10*3/uL (ref 0.7–3.1)
Lymphs: 42 %
MCH: 30.2 pg (ref 26.6–33.0)
MCHC: 32.9 g/dL (ref 31.5–35.7)
MCV: 92 fL (ref 79–97)
Monocytes Absolute: 0.7 10*3/uL (ref 0.1–0.9)
Monocytes: 9 %
Neutrophils Absolute: 3.2 10*3/uL (ref 1.4–7.0)
Neutrophils: 44 %
Platelets: 287 10*3/uL (ref 150–450)
RBC: 4.77 x10E6/uL (ref 4.14–5.80)
RDW: 12.6 % (ref 11.6–15.4)
WBC: 7.3 10*3/uL (ref 3.4–10.8)

## 2018-10-24 LAB — LIPID PANEL
Chol/HDL Ratio: 4.3 ratio (ref 0.0–5.0)
Cholesterol, Total: 165 mg/dL (ref 100–199)
HDL: 38 mg/dL — ABNORMAL LOW (ref >39)
LDL Calculated: 89 mg/dL (ref 0–99)
Triglycerides: 192 mg/dL — ABNORMAL HIGH (ref 0–149)
VLDL Cholesterol Cal: 38 mg/dL (ref 5–40)

## 2018-10-24 LAB — MICROALBUMIN / CREATININE URINE RATIO
Creatinine, Urine: 29.3 mg/dL
Microalb/Creat Ratio: 11 mg/g creat (ref 0–29)
Microalbumin, Urine: 3.2 ug/mL

## 2018-10-24 LAB — VITAMIN D 25 HYDROXY (VIT D DEFICIENCY, FRACTURES): Vit D, 25-Hydroxy: 13.2 ng/mL — ABNORMAL LOW (ref 30.0–100.0)

## 2018-10-24 NOTE — Telephone Encounter (Signed)
Attempts have been made to contact patient - no call back. This encounter will be closed.

## 2018-10-26 ENCOUNTER — Other Ambulatory Visit: Payer: Self-pay | Admitting: *Deleted

## 2018-10-26 DIAGNOSIS — E785 Hyperlipidemia, unspecified: Secondary | ICD-10-CM

## 2018-10-26 MED ORDER — ATORVASTATIN CALCIUM 20 MG PO TABS: ORAL_TABLET | ORAL | 3 refills | Status: DC

## 2018-10-26 NOTE — Progress Notes (Signed)
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

## 2018-10-27 ENCOUNTER — Other Ambulatory Visit: Payer: Self-pay | Admitting: *Deleted

## 2018-10-27 MED ORDER — VITAMIN D (ERGOCALCIFEROL) 1.25 MG (50000 UNIT) PO CAPS: 50000.0000 [IU] | ORAL_CAPSULE | ORAL | 3 refills | Status: DC

## 2018-10-28 LAB — NOVEL CORONAVIRUS, NAA: SARS-CoV-2, NAA: NOT DETECTED

## 2018-11-09 ENCOUNTER — Telehealth: Payer: Self-pay | Admitting: Family Medicine

## 2018-11-09 NOTE — Chronic Care Management (AMB) (Signed)
Chronic Care Management   Note  11/09/2018 Name: Jaaziah Schulke MRN: [id number] DOB: [birth year]  Mattson Dayal is a 68 y.o. year old male who is a primary care patient of Dettinger, Fransisca Kaufmann, MD. I reached out to [name] by phone today in response to a referral sent by Mr. [employer] health plan.    Mr. Schubring was given information about Chronic Care Management services today including:  1. CCM service includes personalized support from designated clinical staff supervised by his physician, including individualized plan of care and coordination with other care providers 2. 24/7 contact phone numbers for assistance for urgent and routine care needs. 3. Service will only be billed when office clinical staff spend 20 minutes or more in a month to coordinate care. 4. Only one practitioner may furnish and bill the service in a calendar month. 5. The patient may stop CCM services at any time (effective at the end of the month) by phone call to the office staff. 6. The patient will be responsible for cost sharing (co-pay) of up to 20% of the service fee (after annual deductible is met).  Patient did not agree to enrollment in care management services and does not wish to consider at this time.  Follow up plan: The patient has been provided with contact information for the chronic care management team and has been advised to call with any health related questions or concerns.   Barrett  [email or web address]   ??[phone]

## 2018-11-29 ENCOUNTER — Telehealth: Payer: Self-pay | Admitting: Family Medicine

## 2018-12-26 ENCOUNTER — Telehealth: Payer: Self-pay | Admitting: Family Medicine

## 2018-12-26 NOTE — Telephone Encounter (Signed)
NA / NVM Note from 11/29/18 - pt NTBS, unable to leave VM, ppw is at front desk

## 2018-12-27 NOTE — Telephone Encounter (Signed)
I do not have the form on my desk and do not know exactly what it was but it looks like they said he needed to be seen at that time, I do not recall the paperwork.

## 2018-12-27 NOTE — Telephone Encounter (Signed)
Patient aware.

## 2018-12-28 ENCOUNTER — Telehealth: Payer: Self-pay | Admitting: Family Medicine

## 2018-12-28 NOTE — Telephone Encounter (Signed)
Appt given for tomorrow per Dettinger. Patient notified and verbalized understanding

## 2018-12-29 ENCOUNTER — Other Ambulatory Visit: Payer: Self-pay

## 2018-12-29 ENCOUNTER — Encounter: Payer: Self-pay | Admitting: Family Medicine

## 2018-12-29 ENCOUNTER — Ambulatory Visit (INDEPENDENT_AMBULATORY_CARE_PROVIDER_SITE_OTHER): Payer: Medicare HMO | Admitting: Family Medicine

## 2018-12-29 ENCOUNTER — Ambulatory Visit: Payer: Medicare HMO | Admitting: Family Medicine

## 2018-12-29 VITALS — BP 151/91 | HR 90 | Temp 97.2°F | Ht 68.0 in | Wt 213.0 lb

## 2018-12-29 DIAGNOSIS — Z0000 Encounter for general adult medical examination without abnormal findings: Secondary | ICD-10-CM | POA: Diagnosis not present

## 2018-12-29 NOTE — Progress Notes (Signed)
BP (!) 151/91   Pulse 90   Temp (!) 97.2 F (36.2 C) (Temporal)   Ht 5\' 8"  (1.727 m)   Wt 213 lb (96.6 kg)   SpO2 98%   BMI 32.39 kg/m    Subjective:   Patient ID: Philip [name], male    DOB: [birth year], 68 y.o.   MRN: [id number]  HPI: [name] is a 68 y.o. 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  He says his health is been doing really well.  He says is little agitated today and this was blood pressure and heart rates are up a little bit will let him calm down and recheck it.  He is already had his visual exam.  He denies any major health issues or concerns.  He says he is feeling relatively very healthy.  Relevant past medical, surgical, family and social history reviewed and updated as indicated. Interim medical history since our last visit reviewed. Allergies and medications reviewed and updated.  Review of Systems  Constitutional: Negative for chills and fever.  Eyes: Negative for discharge.  Respiratory: Negative for shortness of breath and wheezing.   Cardiovascular: Negative for chest pain and leg swelling.  Musculoskeletal: Negative for back pain and gait problem.  Skin: Negative for rash.  All other systems reviewed and are negative.   Per HPI unless specifically indicated above   Allergies as of 12/29/2018      Reactions   Ativan [lorazepam] Other (See Comments)   Caused patient not to remember several days.   Blueberry [vaccinium Angustifolium] Swelling   Fish Oil    Shellfish Allergy    Ultram [tramadol Hcl] Hives      Medication List       Accurate as of December 29, 2018 11:59 PM. If you have any questions, ask your nurse or doctor.        aspirin EC 81 MG tablet Take 81 mg by mouth daily.   atorvastatin 20 MG tablet Commonly known as: LIPITOR Take 1 tablet  every OD   lisinopril 20 MG tablet Commonly known as: ZESTRIL Take 1 tablet (20 mg  total) by mouth daily.   metFORMIN 1000 MG tablet Commonly known as: GLUCOPHAGE Take 1 tablet (1,000 mg total) by mouth 2 (two) times daily with a meal.   Vitamin D (Ergocalciferol) 1.25 MG (50000 UT) Caps capsule Commonly known as: DRISDOL Take 1 capsule (50,000 Units total) by mouth every 7 (seven) days.        Objective:   BP (!) 151/91   Pulse 90   Temp (!) 97.2 F (36.2 C) (Temporal)   Ht 5\' 8"  (1.727 m)   Wt 213 lb (96.6 kg)   SpO2 98%   BMI 32.39 kg/m   Wt Readings from Last 3 Encounters:  12/29/18 213 lb (96.6 kg)  10/20/18 212 lb 3.2 oz (96.3 kg)  09/27/18 216 lb (98 kg)    Physical Exam Vitals signs and nursing note reviewed.  Constitutional:      General: He is not in acute distress.    Appearance: He is well-developed. He is not diaphoretic.  Eyes:     General: No scleral icterus.    Conjunctiva/sclera: Conjunctivae normal.  Neck:     Musculoskeletal: Neck supple.     Thyroid: No thyromegaly.  Cardiovascular:     Rate and Rhythm: Normal rate and regular rhythm.  Heart sounds: Normal heart sounds. No murmur.  Pulmonary:     Effort: Pulmonary effort is normal. No respiratory distress.     Breath sounds: Normal breath sounds. No wheezing.  Musculoskeletal: Normal range of motion.  Lymphadenopathy:     Cervical: No cervical adenopathy.  Skin:    General: Skin is warm and dry.     Findings: No rash.  Neurological:     Mental Status: He is alert and oriented to person, place, and time.     Coordination: Coordination normal.  Psychiatric:        Behavior: Behavior normal.       Assessment & Plan:   Problem List Items Addressed This Visit    None    Visit Diagnoses    Physical exam    -  Primary      Filled out paperwork for his work for a new job for physical. Follow up plan: Return if symptoms worsen or fail to improve.  Counseling provided for all of the vaccine components No orders of the defined types were placed in this  encounter.   Caryl Pina, MD [facility] 01/03/2019, 9:13 PM

## 2019-01-15 ENCOUNTER — Telehealth: Payer: Self-pay | Admitting: Family Medicine

## 2019-01-15 NOTE — Telephone Encounter (Signed)
Spoke with patient to reiterate that we show no call made today and that it was possibly automated

## 2019-01-15 NOTE — Telephone Encounter (Signed)
Patient upset since recorded message called him and there is no note in the computer. Asked to speak to manager - call was transferred to Jonathon Jordan.

## 2019-04-24 ENCOUNTER — Ambulatory Visit: Payer: Medicare HMO | Admitting: Family Medicine

## 2019-08-13 ENCOUNTER — Encounter (HOSPITAL_COMMUNITY): Payer: Self-pay

## 2019-08-13 ENCOUNTER — Telehealth: Payer: Self-pay | Admitting: Family Medicine

## 2019-08-13 ENCOUNTER — Emergency Department (HOSPITAL_COMMUNITY): Payer: Medicare HMO

## 2019-08-13 ENCOUNTER — Other Ambulatory Visit: Payer: Self-pay

## 2019-08-13 ENCOUNTER — Inpatient Hospital Stay (HOSPITAL_COMMUNITY)
Admission: EM | Admit: 2019-08-13 | Discharge: 2019-08-15 | DRG: 066 | Disposition: A | Discharge status: Home / Self Care | Payer: Medicare HMO | Attending: Internal Medicine | Admitting: Internal Medicine

## 2019-08-13 DIAGNOSIS — I6389 Other cerebral infarction: Secondary | ICD-10-CM | POA: Diagnosis not present

## 2019-08-13 DIAGNOSIS — R270 Ataxia, unspecified: Secondary | ICD-10-CM

## 2019-08-13 DIAGNOSIS — F4310 Post-traumatic stress disorder, unspecified: Secondary | ICD-10-CM | POA: Diagnosis present

## 2019-08-13 DIAGNOSIS — R4701 Aphasia: Secondary | ICD-10-CM | POA: Diagnosis present

## 2019-08-13 DIAGNOSIS — R200 Anesthesia of skin: Secondary | ICD-10-CM

## 2019-08-13 DIAGNOSIS — Z7982 Long term (current) use of aspirin: Secondary | ICD-10-CM

## 2019-08-13 DIAGNOSIS — I639 Cerebral infarction, unspecified: Secondary | ICD-10-CM | POA: Diagnosis present

## 2019-08-13 DIAGNOSIS — F17200 Nicotine dependence, unspecified, uncomplicated: Secondary | ICD-10-CM | POA: Diagnosis not present

## 2019-08-13 DIAGNOSIS — I10 Essential (primary) hypertension: Secondary | ICD-10-CM | POA: Diagnosis not present

## 2019-08-13 DIAGNOSIS — Z8673 Personal history of transient ischemic attack (TIA), and cerebral infarction without residual deficits: Secondary | ICD-10-CM | POA: Diagnosis present

## 2019-08-13 DIAGNOSIS — E1165 Type 2 diabetes mellitus with hyperglycemia: Secondary | ICD-10-CM | POA: Diagnosis present

## 2019-08-13 DIAGNOSIS — R000 Tachycardia, unspecified: Secondary | ICD-10-CM | POA: Diagnosis not present

## 2019-08-13 DIAGNOSIS — R2990 Unspecified symptoms and signs involving the nervous system: Secondary | ICD-10-CM

## 2019-08-13 DIAGNOSIS — R29700 NIHSS score 0: Secondary | ICD-10-CM | POA: Diagnosis not present

## 2019-08-13 DIAGNOSIS — Z7984 Long term (current) use of oral hypoglycemic drugs: Secondary | ICD-10-CM

## 2019-08-13 DIAGNOSIS — E119 Type 2 diabetes mellitus without complications: Secondary | ICD-10-CM | POA: Diagnosis not present

## 2019-08-13 DIAGNOSIS — R202 Paresthesia of skin: Secondary | ICD-10-CM | POA: Diagnosis not present

## 2019-08-13 DIAGNOSIS — I152 Hypertension secondary to endocrine disorders: Secondary | ICD-10-CM | POA: Diagnosis present

## 2019-08-13 DIAGNOSIS — E785 Hyperlipidemia, unspecified: Secondary | ICD-10-CM | POA: Diagnosis present

## 2019-08-13 DIAGNOSIS — I6381 Other cerebral infarction due to occlusion or stenosis of small artery: Secondary | ICD-10-CM | POA: Diagnosis not present

## 2019-08-13 DIAGNOSIS — Z20822 Contact with and (suspected) exposure to covid-19: Secondary | ICD-10-CM | POA: Diagnosis present

## 2019-08-13 DIAGNOSIS — Z79899 Other long term (current) drug therapy: Secondary | ICD-10-CM | POA: Diagnosis not present

## 2019-08-13 DIAGNOSIS — F40240 Claustrophobia: Secondary | ICD-10-CM | POA: Diagnosis present

## 2019-08-13 LAB — COMPREHENSIVE METABOLIC PANEL
ALT: 32 U/L (ref 0–44)
AST: 28 U/L (ref 15–41)
Albumin: 4.4 g/dL (ref 3.5–5.0)
Alkaline Phosphatase: 82 U/L (ref 38–126)
Anion gap: 11 (ref 5–15)
BUN: 12 mg/dL (ref 8–23)
CO2: 23 mmol/L (ref 22–32)
Calcium: 10.1 mg/dL (ref 8.9–10.3)
Chloride: 101 mmol/L (ref 98–111)
Creatinine, Ser: 0.79 mg/dL (ref 0.61–1.24)
GFR calc Af Amer: >60 mL/min (ref >60)
GFR calc non Af Amer: >60 mL/min (ref >60)
Glucose, Bld: 241 mg/dL — ABNORMAL HIGH (ref 70–99)
Potassium: 3.7 mmol/L (ref 3.5–5.1)
Sodium: 135 mmol/L (ref 135–145)
Total Bilirubin: 0.9 mg/dL (ref 0.3–1.2)
Total Protein: 7.7 g/dL (ref 6.5–8.1)

## 2019-08-13 LAB — ETHANOL: Alcohol, Ethyl (B): <10 mg/dL (ref <10)

## 2019-08-13 LAB — RAPID URINE DRUG SCREEN, HOSP PERFORMED
Amphetamines: NOT DETECTED
Barbiturates: NOT DETECTED
Benzodiazepines: NOT DETECTED
Cocaine: NOT DETECTED
Opiates: NOT DETECTED
Tetrahydrocannabinol: NOT DETECTED

## 2019-08-13 LAB — CBC
HCT: 47.9 % (ref 39.0–52.0)
Hemoglobin: 16.2 g/dL (ref 13.0–17.0)
MCH: 30.9 pg (ref 26.0–34.0)
MCHC: 33.8 g/dL (ref 30.0–36.0)
MCV: 91.4 fL (ref 80.0–100.0)
Platelets: 324 10*3/uL (ref 150–400)
RBC: 5.24 MIL/uL (ref 4.22–5.81)
RDW: 12.5 % (ref 11.5–15.5)
WBC: 9.3 10*3/uL (ref 4.0–10.5)
nRBC: 0 % (ref 0.0–0.2)

## 2019-08-13 LAB — I-STAT CHEM 8, ED
BUN: 12 mg/dL (ref 8–23)
Calcium, Ion: 1.32 mmol/L (ref 1.15–1.40)
Chloride: 103 mmol/L (ref 98–111)
Creatinine, Ser: 0.8 mg/dL (ref 0.61–1.24)
Glucose, Bld: 233 mg/dL — ABNORMAL HIGH (ref 70–99)
HCT: 47 % (ref 39.0–52.0)
Hemoglobin: 16 g/dL (ref 13.0–17.0)
Potassium: 3.8 mmol/L (ref 3.5–5.1)
Sodium: 136 mmol/L (ref 135–145)
TCO2: 28 mmol/L (ref 22–32)

## 2019-08-13 LAB — APTT: aPTT: 30 seconds (ref 24–36)

## 2019-08-13 LAB — URINALYSIS, ROUTINE W REFLEX MICROSCOPIC
Bilirubin Urine: NEGATIVE
Glucose, UA: >=500 mg/dL — AB
Hgb urine dipstick: NEGATIVE
Ketones, ur: NEGATIVE mg/dL
Leukocytes,Ua: NEGATIVE
Nitrite: NEGATIVE
Protein, ur: NEGATIVE mg/dL
Specific Gravity, Urine: 1.005 (ref 1.005–1.030)
pH: 6 (ref 5.0–8.0)

## 2019-08-13 LAB — RESPIRATORY PANEL BY RT PCR (FLU A&B, COVID)
Influenza A by PCR: NEGATIVE
Influenza B by PCR: NEGATIVE
SARS Coronavirus 2 by RT PCR: NEGATIVE

## 2019-08-13 LAB — DIFFERENTIAL
Abs Immature Granulocytes: 0.02 10*3/uL (ref 0.00–0.07)
Basophils Absolute: 0.1 10*3/uL (ref 0.0–0.1)
Basophils Relative: 1 %
Eosinophils Absolute: 0.2 10*3/uL (ref 0.0–0.5)
Eosinophils Relative: 2 %
Immature Granulocytes: 0 %
Lymphocytes Relative: 37 %
Lymphs Abs: 3.5 10*3/uL (ref 0.7–4.0)
Monocytes Absolute: 0.9 10*3/uL (ref 0.1–1.0)
Monocytes Relative: 9 %
Neutro Abs: 4.7 10*3/uL (ref 1.7–7.7)
Neutrophils Relative %: 51 %

## 2019-08-13 LAB — PROTIME-INR
INR: 1 (ref 0.8–1.2)
Prothrombin Time: 12.7 seconds (ref 11.4–15.2)

## 2019-08-13 MED ORDER — ASPIRIN 81 MG PO CHEW
324.0000 mg | CHEWABLE_TABLET | Freq: Once | ORAL | Status: AC
  Administered 2019-08-13: 324 mg via ORAL
  Filled 2019-08-13: qty 4

## 2019-08-13 MED ORDER — IOHEXOL 350 MG/ML SOLN
75.0000 mL | Freq: Once | INTRAVENOUS | Status: AC | PRN
  Administered 2019-08-13: 18:00:00 75 mL via INTRAVENOUS

## 2019-08-13 NOTE — ED Notes (Signed)
ED Provider at bedside. 

## 2019-08-13 NOTE — Consult Note (Signed)
[facility] Consult Services  Stat Consult  Date of Service:   08/13/2019 19:58:10  Impression:     .  R42 - Dizziness/ Vertigo/ Giddiness  Comments/Sign-Out: acute onset B UE numbness and gait instability - concerning for brainstem/cerebellar stroke vs idiopathic cervical transverse myelitis vs early onset GBS. Recommend admission for MRI brain/C spine wwo contrast.  CT HEAD: Showed No Acute Hemorrhage or Acute Core Infarct Reviewed  Metrics: [facility] Notification Time: 08/13/2019 19:55:43 Stamp Time: 08/13/2019 19:58:10 Callback Response Time: 08/13/2019 20:00:54  Our recommendations are outlined below.  Recommendations:     .  start ASA if CT head is neg for hemorrhage.   Imaging Studies:     .  MRI Head with and Without Contrast  Therapies:     .  Physical Therapy, Occupational Therapy, Speech Therapy Assessment When Applicable  Other WorkUp:     .  Infectious/metabolic workup per primary team  Disposition: Neurology Follow Up Recommended  Sign Out:     .  Discussed with Emergency Department Provider  ----------------------------------------------------------------------------------------------------  Chief Complaint: gait instability  History of Present Illness: Patient is a 69 year old Male.  Ms. Bickers is a 69 year old man with acute onset right arm/hand numbness, followed by L arm numbness starting at 1700 yesterday. He went to sleep, and when he woke up this am, he noted gait instability and ataxia. He has ongoing arm numbness as well. No numbness in his legs.    Past Medical History:     . Hypertension     . Diabetes Mellitus     . Hyperlipidemia     . There is NO history of Coronary Artery Disease     . There is NO history of Stroke  Anticoagulant use:  No  Antiplatelet use: ASA     Examination: BP(pending), Pulse(pending), Blood Glucose(241) 1A: Level of Consciousness - Alert; keenly responsive +  0 1B: Ask Month and Age - Both Questions Right + 0 1C: Blink Eyes & Squeeze Hands - Performs Both Tasks + 0 2: Test Horizontal Extraocular Movements - Normal + 0 3: Test Visual Fields - No Visual Loss + 0 4: Test Facial Palsy (Use Grimace if Obtunded) - Normal symmetry + 0 5A: Test Left Arm Motor Drift - No Drift for 10 Seconds + 0 5B: Test Right Arm Motor Drift - No Drift for 10 Seconds + 0 6A: Test Left Leg Motor Drift - No Drift for 5 Seconds + 0 6B: Test Right Leg Motor Drift - No Drift for 5 Seconds + 0 7: Test Limb Ataxia (FNF/Heel-Shin) - No Ataxia + 0 8: Test Sensation - Normal; No sensory loss + 0 9: Test Language/Aphasia - Normal; No aphasia + 0 10: Test Dysarthria - Normal + 0 11: Test Extinction/Inattention - No abnormality + 0  NIHSS Score: 0    Due to the immediate potential for life-threatening deterioration due to underlying acute neurologic illness, I spent 20 minutes providing critical care. This time includes time for face to face visit via telemedicine, review of medical records, imaging studies and discussion of findings with providers, the patient and/or family.   Dr Hal Morales   [facility] [phone]  Case [id number]

## 2019-08-13 NOTE — Telephone Encounter (Signed)
Patient reports he has been having numbness in his thighs and his arms over the last several days.  He reports that he has no slurred speech, no drooping of facial features.  He wants to make an appointment to see Dr. Warrick Parisian.  I explained to him that Dr. Merita Norton schedule was full this week and we did not have any openings with him, I offered him an appointment with another provider but he refused.  I advised patient that he should go to the ER to be evaluated but he refuses this also.  Dr. Merita Norton next available appointment is 08/24/2019 and patient was scheduled then.  Patient was informed to go to ER with any worsening symptoms.

## 2019-08-13 NOTE — ED Provider Notes (Signed)
Emergency Department Provider Note   I have reviewed the triage vital signs and the nursing notes.   HISTORY  Chief Complaint Numbness   HPI Philip [name] is a 69 y.o. male with PMH of DM, HTN, and HLD presents to the emergency department for evaluation of acute onset right hand numbness and coordination issues.  Patient first noticed symptoms last night.  He denies headache, neck pain, back pain.  He first noticed numbness in the hand but felt like his strength was normal.  He has noticed, however, he has had difficulty with handwriting and coordinated movements such as getting himself dressed.  He denies any vertigo.  He denies numbness in the legs.  He does have some associated numbness in the left hand now as well but not as severe.  He feels like his legs are generally weak but denies any unilateral weakness that he has noticed.  No vision changes or speech changes.  Denies difficulty with swallowing.   Past Medical History:  Diagnosis Date  . Chest pain   . Diabetes mellitus without complication ([place])   . Hyperlipidemia   . Hypertension   . PTSD (post-traumatic stress disorder)     Patient Active Problem List   Diagnosis Date Noted  . Numbness 08/13/2019  . Ataxia 08/13/2019  . S/P lumbar microdiscectomy 08/01/2014  . PTSD (post-traumatic stress disorder) 07/23/2014  . Hyperlipidemia   . Vitamin D deficiency 08/20/2013  . Hypercalcemia 06/19/2013  . Diabetes ([place]) 10/30/2012  . Non compliance with medical treatment 10/30/2012  . Panic disorder 10/30/2012  . Hypertension 09/24/2010    Past Surgical History:  Procedure Laterality Date  . [place]   hernitated and ruptured discs  . LUMBAR LAMINECTOMY/DECOMPRESSION MICRODISCECTOMY Right 08/01/2014   Procedure: Right L/4-5 Extraforaminal Diskectomy;  Surgeon: Eustace Moore, MD;  Location: [facility];  Service: Neurosurgery;  Laterality: Right;  Right L/4-5 Extraforaminal Diskectomy  . TONSILLECTOMY    .  TONSILLECTOMY      Allergies Ativan [lorazepam], Blueberry [vaccinium angustifolium], Fish oil, Shellfish allergy, and Ultram [tramadol hcl]  Family History  Adopted: Yes    Social History Social History   Tobacco Use  . Smoking status: Current Some Day Smoker    Packs/day: 0.50    Years: 53.00    Pack years: 26.50  . Smokeless tobacco: Never Used  Substance Use Topics  . Alcohol use: No  . Drug use: No    Review of Systems  Constitutional: No fever/chills Eyes: No visual changes. ENT: No sore throat. Cardiovascular: Denies chest pain. Respiratory: Denies shortness of breath. Gastrointestinal: No abdominal pain.  No nausea, no vomiting.  No diarrhea.  No constipation. Genitourinary: Negative for dysuria. Musculoskeletal: Negative for back pain. Skin: Negative for rash. Neurological: Negative for headaches or focal weakness. Numbness in the left hand and coordination issues.   10-point ROS otherwise negative.  ____________________________________________   PHYSICAL EXAM:  VITAL SIGNS: ED Triage Vitals  Enc Vitals Group     BP --      Pulse --      Resp --      Temp --      Temp src --      SpO2 --      Weight 08/13/19 1658 209 lb (94.8 kg)     Height 08/13/19 1658 5\' 9"  (1.753 m)     Head Circumference --      Peak Flow --      Pain Score 08/13/19 1656  0     Pain Loc --      Pain Edu? --      Excl. in [place]? --     Constitutional: Alert and oriented. Well appearing and in no acute distress. Eyes: Conjunctivae are normal. PERRL. EOMI. Head: Atraumatic. Nose: No congestion/rhinnorhea. Mouth/Throat: Mucous membranes are moist.  Neck: No stridor.   Cardiovascular: Normal rate, regular rhythm. Good peripheral circulation. Grossly normal heart sounds.   Respiratory: Normal respiratory effort.  No retractions. Lungs CTAB. Gastrointestinal: No distention.  Musculoskeletal: No gross deformities of extremities. Neurologic:  Normal speech and language.  No  facial asymmetry.  Normal sensation to the bilateral face.  Patient with 5 out of 5 strength in the upper and lower extremities.  Some mild ataxia in the right upper extremity with finger-to-nose testing.  No pronator drift.  Normal heel-to-shin bilaterally.  Skin:  Skin is warm, dry and intact. No rash noted.  ____________________________________________   LABS (all labs ordered are listed, but only abnormal results are displayed)  Labs Reviewed  COMPREHENSIVE METABOLIC PANEL - Abnormal; Notable for the following components:      Result Value   Glucose, Bld 241 (*)    All other components within normal limits  URINALYSIS, ROUTINE W REFLEX MICROSCOPIC - Abnormal; Notable for the following components:   Glucose, UA >=500 (*)    Bacteria, UA RARE (*)    All other components within normal limits  GLUCOSE, CAPILLARY - Abnormal; Notable for the following components:   Glucose-Capillary 199 (*)    All other components within normal limits  I-STAT CHEM 8, ED - Abnormal; Notable for the following components:   Glucose, Bld 233 (*)    All other components within normal limits  RESPIRATORY PANEL BY RT PCR (FLU A&B, COVID)  MRSA PCR SCREENING  ETHANOL  PROTIME-INR  APTT  CBC  DIFFERENTIAL  RAPID URINE DRUG SCREEN, HOSP PERFORMED  HIV ANTIBODY (ROUTINE TESTING W REFLEX)  HEMOGLOBIN A1C  LIPID PANEL   ____________________________________________  EKG  Rate: 71 PR: 200 QTc: 462  Sinus arrhythmia. Narrow QRS. Low voltage QRS. T wave flattening diffusely. No STEMI.  ____________________________________________  RADIOLOGY  CT Angio Head W or Wo Contrast  Result Date: 08/13/2019 CLINICAL DATA:  Numbness and tingling in both upper and lower extremities. EXAM: CT ANGIOGRAPHY HEAD AND NECK TECHNIQUE: Multidetector CT imaging of the head and neck was performed using the standard protocol during bolus administration of intravenous contrast. Multiplanar CT image reconstructions and MIPs  were obtained to evaluate the vascular anatomy. Carotid stenosis measurements (when applicable) are obtained utilizing NASCET criteria, using the distal internal carotid diameter as the denominator. CONTRAST:  32mL OMNIPAQUE IOHEXOL 350 MG/ML SOLN COMPARISON:  None. FINDINGS: CT HEAD FINDINGS Brain: There is no mass, hemorrhage or extra-axial collection. The size and configuration of the ventricles and extra-axial CSF spaces are normal. There is no acute or chronic infarction. The brain parenchyma is normal. Skull: The visualized skull base, calvarium and extracranial soft tissues are normal. Sinuses/Orbits: No fluid levels or advanced mucosal thickening of the visualized paranasal sinuses. No mastoid or middle ear effusion. The orbits are normal. CTA NECK FINDINGS SKELETON: There is no bony spinal canal stenosis. No lytic or blastic lesion. OTHER NECK: Normal pharynx, larynx and major salivary glands. No cervical lymphadenopathy. Unremarkable thyroid gland. UPPER CHEST: No pneumothorax or pleural effusion. No nodules or masses. AORTIC ARCH: There is no calcific atherosclerosis of the aortic arch. There is no aneurysm, dissection or hemodynamically significant stenosis  of the visualized portion of the aorta. Conventional 3 vessel aortic branching pattern. The visualized proximal subclavian arteries are widely patent. RIGHT CAROTID SYSTEM: Normal without aneurysm, dissection or stenosis. LEFT CAROTID SYSTEM: Normal without aneurysm, dissection or stenosis. VERTEBRAL ARTERIES: Left dominant configuration. Both origins are clearly patent. There is no dissection, occlusion or flow-limiting stenosis to the skull base (V1-V3 segments). CTA HEAD FINDINGS POSTERIOR CIRCULATION: --Vertebral arteries: Normal V4 segments. --Posterior inferior cerebellar arteries (PICA): Patent origins from the vertebral arteries. --Anterior inferior cerebellar arteries (AICA): Patent origins from the basilar artery. --Basilar artery: Normal.  --Superior cerebellar arteries: Normal. --Posterior cerebral arteries: Normal. The right PCA is predominantly supplied by the posterior communicating artery. ANTERIOR CIRCULATION: --Intracranial internal carotid arteries: Normal. --Anterior cerebral arteries (ACA): Normal. Both A1 segments are present. Patent anterior communicating artery (a-comm). --Middle cerebral arteries (MCA): Normal. VENOUS SINUSES: As permitted by contrast timing, patent. ANATOMIC VARIANTS: Fetal origin of the right posterior cerebral artery. Review of the MIP images confirms the above findings. IMPRESSION: Normal CTA of the head and neck. Electronically Signed   By: Ulyses Jarred M.D.   On: 08/13/2019 19:02   CT Angio Neck W and/or Wo Contrast  Result Date: 08/13/2019 CLINICAL DATA:  Numbness and tingling in both upper and lower extremities. EXAM: CT ANGIOGRAPHY HEAD AND NECK TECHNIQUE: Multidetector CT imaging of the head and neck was performed using the standard protocol during bolus administration of intravenous contrast. Multiplanar CT image reconstructions and MIPs were obtained to evaluate the vascular anatomy. Carotid stenosis measurements (when applicable) are obtained utilizing NASCET criteria, using the distal internal carotid diameter as the denominator. CONTRAST:  65mL OMNIPAQUE IOHEXOL 350 MG/ML SOLN COMPARISON:  None. FINDINGS: CT HEAD FINDINGS Brain: There is no mass, hemorrhage or extra-axial collection. The size and configuration of the ventricles and extra-axial CSF spaces are normal. There is no acute or chronic infarction. The brain parenchyma is normal. Skull: The visualized skull base, calvarium and extracranial soft tissues are normal. Sinuses/Orbits: No fluid levels or advanced mucosal thickening of the visualized paranasal sinuses. No mastoid or middle ear effusion. The orbits are normal. CTA NECK FINDINGS SKELETON: There is no bony spinal canal stenosis. No lytic or blastic lesion. OTHER NECK: Normal pharynx,  larynx and major salivary glands. No cervical lymphadenopathy. Unremarkable thyroid gland. UPPER CHEST: No pneumothorax or pleural effusion. No nodules or masses. AORTIC ARCH: There is no calcific atherosclerosis of the aortic arch. There is no aneurysm, dissection or hemodynamically significant stenosis of the visualized portion of the aorta. Conventional 3 vessel aortic branching pattern. The visualized proximal subclavian arteries are widely patent. RIGHT CAROTID SYSTEM: Normal without aneurysm, dissection or stenosis. LEFT CAROTID SYSTEM: Normal without aneurysm, dissection or stenosis. VERTEBRAL ARTERIES: Left dominant configuration. Both origins are clearly patent. There is no dissection, occlusion or flow-limiting stenosis to the skull base (V1-V3 segments). CTA HEAD FINDINGS POSTERIOR CIRCULATION: --Vertebral arteries: Normal V4 segments. --Posterior inferior cerebellar arteries (PICA): Patent origins from the vertebral arteries. --Anterior inferior cerebellar arteries (AICA): Patent origins from the basilar artery. --Basilar artery: Normal. --Superior cerebellar arteries: Normal. --Posterior cerebral arteries: Normal. The right PCA is predominantly supplied by the posterior communicating artery. ANTERIOR CIRCULATION: --Intracranial internal carotid arteries: Normal. --Anterior cerebral arteries (ACA): Normal. Both A1 segments are present. Patent anterior communicating artery (a-comm). --Middle cerebral arteries (MCA): Normal. VENOUS SINUSES: As permitted by contrast timing, patent. ANATOMIC VARIANTS: Fetal origin of the right posterior cerebral artery. Review of the MIP images confirms the above findings. IMPRESSION: Normal CTA of  the head and neck. Electronically Signed   By: Ulyses Jarred M.D.   On: 08/13/2019 19:02    ____________________________________________   PROCEDURES  Procedure(s) performed:   Procedures  CRITICAL CARE Performed by: Margette Fast Total critical care time: 35  minutes Critical care time was exclusive of separately billable procedures and treating other patients. Critical care was necessary to treat or prevent imminent or life-threatening deterioration. Critical care was time spent personally by me on the following activities: development of treatment plan with patient and/or surrogate as well as nursing, discussions with consultants, evaluation of patient's response to treatment, examination of patient, obtaining history from patient or surrogate, ordering and performing treatments and interventions, ordering and review of laboratory studies, ordering and review of radiographic studies, pulse oximetry and re-evaluation of patient's condition.  Nanda Quinton, MD Emergency Medicine  ____________________________________________   INITIAL IMPRESSION / ASSESSMENT AND PLAN / ED COURSE  Pertinent labs & imaging results that were available during my care of the patient were reviewed by me and considered in my medical decision making (see chart for details).   Patient presents emergency department for evaluation of numbness and coordination issues primarily in the right upper extremity.  Some mild ataxia noted with finger-to-nose testing in the right upper extremity but no other clear neuro deficits.  Patient does not experiencing neck or back pain to raise suspicion for radicular symptoms.  Patient is outside the window for TPA. No LVO concern clinically. Plan for CT head along with angio.   08:32 PM  Reviewed the patient's labs and CTA imaging reads.  No acute abnormality there.  Spoke with teleneurology after their assessment.  They recommend MRI brain and cervical spine along with admit for stroke work-up. COVID negative. Plan for admit.   Discussed patient's case with [facility] to request admission. Patient and family (if present) updated with plan. Care transferred to [facility] service.  I reviewed all nursing notes, vitals, pertinent old records, EKGs, labs, imaging  (as available).  ____________________________________________  FINAL CLINICAL IMPRESSION(S) / ED DIAGNOSES  Final diagnoses:  Stroke-like symptoms     MEDICATIONS GIVEN DURING THIS VISIT:  Medications  aspirin EC tablet 81 mg (has no administration in time range)  atorvastatin (LIPITOR) tablet 20 mg (has no administration in time range)  acetaminophen (TYLENOL) tablet 650 mg (has no administration in time range)    Or  acetaminophen (TYLENOL) 160 MG/5ML solution 650 mg (has no administration in time range)    Or  acetaminophen (TYLENOL) suppository 650 mg (has no administration in time range)  senna-docusate (Senokot-S) tablet 1 tablet (has no administration in time range)  enoxaparin (LOVENOX) injection 40 mg (40 mg Subcutaneous Given 08/14/19 0209)  insulin aspart (novoLOG) injection 0-9 Units (2 Units Subcutaneous Given 08/14/19 0827)  Chlorhexidine Gluconate Cloth 2 % PADS 6 each (has no administration in time range)  aspirin chewable tablet 324 mg (324 mg Oral Given 08/13/19 1720)  iohexol (OMNIPAQUE) 350 MG/ML injection 75 mL (75 mLs Intravenous Contrast Given 08/13/19 1811)   stroke: mapping our early stages of recovery book ( Does not apply Given 08/14/19 0210)    Note:  This document was prepared using Dragon voice recognition software and may include unintentional dictation errors.  Nanda Quinton, MD, [facility] Emergency Medicine    Viren Lebeau, [facility], MD 08/14/19 [phone]

## 2019-08-13 NOTE — ED Triage Notes (Signed)
Pt presents to ED with complaints of numbness and tingling both hands and legs. Pt states started last night on right side. Dr Laverta Baltimore at bedside.

## 2019-08-13 NOTE — H&P (Signed)
History and Physical    Patient Demographics:    Philip [name] [id number] DOB: [birth year] DOA: 08/13/2019  PCP: Dettinger, Fransisca Kaufmann, MD  Patient coming from: Home  I have personally briefly reviewed patient's old medical records in [facility]  Chief Complaint: Numbness   Assessment & Plan:     Assessment/Plan Principal Problem:   Numbness Active Problems:   Hypertension   Diabetes ([place])   Hyperlipidemia   PTSD (post-traumatic stress disorder)   Ataxia     Principal Problem: Numbness, ataxia concerning for CVA Patient presented with bilateral hand and facial numbness as well as ataxia, difficulty with ambulation, difficulty with fine motor skills.  CT head and CTA of the head and neck showed no evidence of acute stroke or vascular abnormalities.  Patient does have mild cerebellar abnormalities on physical exam.  No visual disturbances.  No evidence of ophthalmoplegia, cranial nerve deficits.  Concern for cerebellar stroke.  Other differential diagnosis include demyelination syndrome.  No neck pain reported.  Less likely to be cervical spinal etiology. -Continue aspirin -Telemetry monitoring -Neurochecks every 4 hours -MRI brain, cervical spine with and without contrast -Echocardiogram in a.m. -Neurology consult in a.m. -PT/OT evaluation  Other Active Problems: Diabetes mellitus type 2 -Sliding scale insulin coverage with fingerstick monitoring  Hypertension -Allow for permissive hypertension, hold lisinopril  Hyperlipidemia -Continue Lipitor     DVT prophylaxis: Lovenox Code Status:  Full code Family Communication: N/A  Disposition Plan: Place in observation for evaluation of neurological symptoms concerning for possible CVA, plan for MRI, neurology consult Consults called: N/A Admission status: Observation stay    HPI:     HPI: Philip [name] is a 69 y.o. male with medical history significant of hypertension, hyperlipidemia, diabetes mellitus type  2 who presented to the ER with numbness in both upper extremities, both sides of the face which started on Sunday afternoon.  He states he had difficulty with writing and felt like his coordination was off.  He then started having difficulty with walking and worsening coordination.  He reported having difficulty with fine motor skills and difficulty with writing, getting himself dressed, buttoning his shirt.  Continued to have numbness in his fingers. Patient was seen by telemetry neurology in the ER for concern for brainstem stroke versus cervical disease and recommended admission for MRI. Patient has had no nausea, vomiting, abdominal pain, diarrhea, chest pain, shortness of breath, dysuria, seizures, syncope. ED Course:  Vital Signs reviewed on presentation, significant for temperature 98.5, blood pressure 167/107, heart rate 100, saturation 100% on room air. Labs reviewed, significant for sodium 135, potassium 3.7, BUN 12, creatinine 0.9, lipase within normal meds, WBC count 9.3, hemoglobin 16.2, hematocrit 47, platelets 324, flu PCR negative, SARS Covid RT-PCR negative, urinalysis negative, alcohol level negative, urine drug screen negative. Imaging personally Reviewed, CT angiogram of the head and neck shows no evidence of vascular abnormalities.  No evidence of mass hemorrhage or CVA. EKG personally reviewed, shows sinus tachycardia, no acute ST-T changes.    Review of systems:    Review of Systems: As per HPI otherwise 10 point review of systems negative.  All other review of systems is negative except the ones noted above in the HPI.    Past Medical and Surgical History:  Reviewed by me  Past Medical History:  Diagnosis Date  . Chest pain   . Diabetes mellitus without complication ([place])   . Hyperlipidemia   . Hypertension   . PTSD (post-traumatic stress disorder)  Past Surgical History:  Procedure Laterality Date  . [place]   hernitated and ruptured discs  .  LUMBAR LAMINECTOMY/DECOMPRESSION MICRODISCECTOMY Right 08/01/2014   Procedure: Right L/4-5 Extraforaminal Diskectomy;  Surgeon: Eustace Moore, MD;  Location: [facility];  Service: Neurosurgery;  Laterality: Right;  Right L/4-5 Extraforaminal Diskectomy  . TONSILLECTOMY    . TONSILLECTOMY       Social History:  Reviewed by me   reports that he has been smoking. He has a 26.50 pack-year smoking history. He has never used smokeless tobacco. He reports that he does not drink alcohol or use drugs.  Allergies:    Allergies  Allergen Reactions  . Ativan [Lorazepam] Other (See Comments)    Caused patient not to remember several days.  Marland Kitchen Blueberry [Vaccinium Angustifolium] Swelling  . Fish Oil   . Shellfish Allergy   . Ultram [Tramadol Hcl] Hives    Family History :   Family History  Adopted: Yes   Family history reviewed, noted as above, not pertinent to current presentation.   Home Medications:    Prior to Admission medications   Medication Sig Start Date End Date Taking? Authorizing Provider  aspirin EC 81 MG tablet Take 81 mg by mouth daily.   Yes [provider]  atorvastatin (LIPITOR) 20 MG tablet Take 1 tablet  every OD Patient taking differently: Take 20 mg by mouth every other day.  10/26/18  Yes Dettinger, Fransisca Kaufmann, MD  Vitamin D, Ergocalciferol, (DRISDOL) 1.25 MG (50000 UT) CAPS capsule Take 1 capsule (50,000 Units total) by mouth every 7 (seven) days. 10/27/18  Yes Dettinger, Fransisca Kaufmann, MD  lisinopril (ZESTRIL) 20 MG tablet Take 1 tablet (20 mg total) by mouth daily. Patient not taking: Reported on 08/13/2019 10/20/18   Dettinger, Fransisca Kaufmann, MD  metFORMIN (GLUCOPHAGE) 1000 MG tablet Take 1 tablet (1,000 mg total) by mouth 2 (two) times daily with a meal. Patient not taking: Reported on 08/13/2019 10/20/18   Dettinger, Fransisca Kaufmann, MD    Physical Exam:    Physical Exam: Vitals:   08/13/19 1658 08/13/19 2030 08/13/19 2043  Pulse:  100 89  Resp:  17 10  SpO2:  98% 98%   Weight: 94.8 kg    Height: 5\' 9"  (1.753 m)      Constitutional: NAD, calm, comfortable Vitals:   08/13/19 1658 08/13/19 2030 08/13/19 2043  Pulse:  100 89  Resp:  17 10  SpO2:  98% 98%  Weight: 94.8 kg    Height: 5\' 9"  (1.753 m)     Eyes: PERRL, lids and conjunctivae normal ENMT: Mucous membranes are moist. Posterior pharynx clear of any exudate or lesions.Normal dentition.  Neck: normal, supple, no masses, no thyromegaly Respiratory: clear to auscultation bilaterally, no wheezing, no crackles. Normal respiratory effort. No accessory muscle use.  Cardiovascular: Regular rate and rhythm, no murmurs / rubs / gallops. No extremity edema. 2+ pedal pulses. No carotid bruits.  Abdomen: no tenderness, no masses palpated. No hepatosplenomegaly. Bowel sounds positive.  Musculoskeletal: no clubbing / cyanosis. No joint deformity upper and lower extremities. Good ROM, no contractures. Normal muscle tone.  Skin: no rashes, lesions, ulcers. No induration Neurologic: CN 2-12 grossly intact. Sensation intact, DTR normal. Strength 5/5 in all 4.  Romberg sign is positive.  Significant ataxia on ambulation.  Has Psychiatric: Normal judgment and insight. Alert and oriented x 3. Normal mood.    Decubitus Ulcers: Not present on admission Catheters and tubes: None  Data Review:  Labs on Admission: I have personally reviewed following labs and imaging studies  CBC: Recent Labs  Lab 08/13/19 1659 08/13/19 1703  WBC 9.3  --   NEUTROABS 4.7  --   HGB 16.2 16.0  HCT 47.9 47.0  MCV 91.4  --   PLT 324  --    Basic Metabolic Panel: Recent Labs  Lab 08/13/19 1659 08/13/19 1703  NA 135 136  K 3.7 3.8  CL 101 103  CO2 23  --   GLUCOSE 241* 233*  BUN 12 12  CREATININE 0.79 0.80  CALCIUM 10.1  --    GFR: Estimated Creatinine Clearance: 100.4 mL/min (by C-G formula based on SCr of 0.8 mg/dL). Liver Function Tests: Recent Labs  Lab 08/13/19 1659  AST 28  ALT 32  ALKPHOS 82  BILITOT  0.9  PROT 7.7  ALBUMIN 4.4   No results for input(s): LIPASE, AMYLASE in the last 168 hours. No results for input(s): AMMONIA in the last 168 hours. Coagulation Profile: Recent Labs  Lab 08/13/19 1659  INR 1.0   Cardiac Enzymes: No results for input(s): CKTOTAL, CKMB, CKMBINDEX, TROPONINI in the last 168 hours. BNP (last 3 results) No results for input(s): PROBNP in the last 8760 hours. HbA1C: No results for input(s): HGBA1C in the last 72 hours. CBG: No results for input(s): GLUCAP in the last 168 hours. Lipid Profile: No results for input(s): CHOL, HDL, LDLCALC, TRIG, CHOLHDL, LDLDIRECT in the last 72 hours. Thyroid Function Tests: No results for input(s): TSH, T4TOTAL, FREET4, T3FREE, THYROIDAB in the last 72 hours. Anemia Panel: No results for input(s): VITAMINB12, FOLATE, FERRITIN, TIBC, IRON, RETICCTPCT in the last 72 hours. Urine analysis:    Component Value Date/Time   COLORURINE YELLOW 08/13/2019 1659   APPEARANCEUR CLEAR 08/13/2019 1659   APPEARANCEUR Clear 10/20/2018 0916   LABSPEC 1.005 08/13/2019 1659   PHURINE 6.0 08/13/2019 1659   GLUCOSEU >=500 (A) 08/13/2019 1659   HGBUR NEGATIVE 08/13/2019 1659   BILIRUBINUR NEGATIVE 08/13/2019 1659   BILIRUBINUR Negative 10/20/2018 0916   KETONESUR NEGATIVE 08/13/2019 1659   PROTEINUR NEGATIVE 08/13/2019 1659   UROBILINOGEN 4.0 (H) 07/31/2014 1550   NITRITE NEGATIVE 08/13/2019 1659   LEUKOCYTESUR NEGATIVE 08/13/2019 1659     Imaging Results:      Radiological Exams on Admission: CT Angio Head W or Wo Contrast  Result Date: 08/13/2019 CLINICAL DATA:  Numbness and tingling in both upper and lower extremities. EXAM: CT ANGIOGRAPHY HEAD AND NECK TECHNIQUE: Multidetector CT imaging of the head and neck was performed using the standard protocol during bolus administration of intravenous contrast. Multiplanar CT image reconstructions and MIPs were obtained to evaluate the vascular anatomy. Carotid stenosis measurements  (when applicable) are obtained utilizing NASCET criteria, using the distal internal carotid diameter as the denominator. CONTRAST:  62mL OMNIPAQUE IOHEXOL 350 MG/ML SOLN COMPARISON:  None. FINDINGS: CT HEAD FINDINGS Brain: There is no mass, hemorrhage or extra-axial collection. The size and configuration of the ventricles and extra-axial CSF spaces are normal. There is no acute or chronic infarction. The brain parenchyma is normal. Skull: The visualized skull base, calvarium and extracranial soft tissues are normal. Sinuses/Orbits: No fluid levels or advanced mucosal thickening of the visualized paranasal sinuses. No mastoid or middle ear effusion. The orbits are normal. CTA NECK FINDINGS SKELETON: There is no bony spinal canal stenosis. No lytic or blastic lesion. OTHER NECK: Normal pharynx, larynx and major salivary glands. No cervical lymphadenopathy. Unremarkable thyroid gland. UPPER CHEST: No pneumothorax or pleural effusion.  No nodules or masses. AORTIC ARCH: There is no calcific atherosclerosis of the aortic arch. There is no aneurysm, dissection or hemodynamically significant stenosis of the visualized portion of the aorta. Conventional 3 vessel aortic branching pattern. The visualized proximal subclavian arteries are widely patent. RIGHT CAROTID SYSTEM: Normal without aneurysm, dissection or stenosis. LEFT CAROTID SYSTEM: Normal without aneurysm, dissection or stenosis. VERTEBRAL ARTERIES: Left dominant configuration. Both origins are clearly patent. There is no dissection, occlusion or flow-limiting stenosis to the skull base (V1-V3 segments). CTA HEAD FINDINGS POSTERIOR CIRCULATION: --Vertebral arteries: Normal V4 segments. --Posterior inferior cerebellar arteries (PICA): Patent origins from the vertebral arteries. --Anterior inferior cerebellar arteries (AICA): Patent origins from the basilar artery. --Basilar artery: Normal. --Superior cerebellar arteries: Normal. --Posterior cerebral arteries: Normal.  The right PCA is predominantly supplied by the posterior communicating artery. ANTERIOR CIRCULATION: --Intracranial internal carotid arteries: Normal. --Anterior cerebral arteries (ACA): Normal. Both A1 segments are present. Patent anterior communicating artery (a-comm). --Middle cerebral arteries (MCA): Normal. VENOUS SINUSES: As permitted by contrast timing, patent. ANATOMIC VARIANTS: Fetal origin of the right posterior cerebral artery. Review of the MIP images confirms the above findings. IMPRESSION: Normal CTA of the head and neck. Electronically Signed   By: Ulyses Jarred M.D.   On: 08/13/2019 19:02   CT Angio Neck W and/or Wo Contrast  Result Date: 08/13/2019 CLINICAL DATA:  Numbness and tingling in both upper and lower extremities. EXAM: CT ANGIOGRAPHY HEAD AND NECK TECHNIQUE: Multidetector CT imaging of the head and neck was performed using the standard protocol during bolus administration of intravenous contrast. Multiplanar CT image reconstructions and MIPs were obtained to evaluate the vascular anatomy. Carotid stenosis measurements (when applicable) are obtained utilizing NASCET criteria, using the distal internal carotid diameter as the denominator. CONTRAST:  55mL OMNIPAQUE IOHEXOL 350 MG/ML SOLN COMPARISON:  None. FINDINGS: CT HEAD FINDINGS Brain: There is no mass, hemorrhage or extra-axial collection. The size and configuration of the ventricles and extra-axial CSF spaces are normal. There is no acute or chronic infarction. The brain parenchyma is normal. Skull: The visualized skull base, calvarium and extracranial soft tissues are normal. Sinuses/Orbits: No fluid levels or advanced mucosal thickening of the visualized paranasal sinuses. No mastoid or middle ear effusion. The orbits are normal. CTA NECK FINDINGS SKELETON: There is no bony spinal canal stenosis. No lytic or blastic lesion. OTHER NECK: Normal pharynx, larynx and major salivary glands. No cervical lymphadenopathy. Unremarkable  thyroid gland. UPPER CHEST: No pneumothorax or pleural effusion. No nodules or masses. AORTIC ARCH: There is no calcific atherosclerosis of the aortic arch. There is no aneurysm, dissection or hemodynamically significant stenosis of the visualized portion of the aorta. Conventional 3 vessel aortic branching pattern. The visualized proximal subclavian arteries are widely patent. RIGHT CAROTID SYSTEM: Normal without aneurysm, dissection or stenosis. LEFT CAROTID SYSTEM: Normal without aneurysm, dissection or stenosis. VERTEBRAL ARTERIES: Left dominant configuration. Both origins are clearly patent. There is no dissection, occlusion or flow-limiting stenosis to the skull base (V1-V3 segments). CTA HEAD FINDINGS POSTERIOR CIRCULATION: --Vertebral arteries: Normal V4 segments. --Posterior inferior cerebellar arteries (PICA): Patent origins from the vertebral arteries. --Anterior inferior cerebellar arteries (AICA): Patent origins from the basilar artery. --Basilar artery: Normal. --Superior cerebellar arteries: Normal. --Posterior cerebral arteries: Normal. The right PCA is predominantly supplied by the posterior communicating artery. ANTERIOR CIRCULATION: --Intracranial internal carotid arteries: Normal. --Anterior cerebral arteries (ACA): Normal. Both A1 segments are present. Patent anterior communicating artery (a-comm). --Middle cerebral arteries (MCA): Normal. VENOUS SINUSES: As permitted by contrast timing,  patent. ANATOMIC VARIANTS: Fetal origin of the right posterior cerebral artery. Review of the MIP images confirms the above findings. IMPRESSION: Normal CTA of the head and neck. Electronically Signed   By: Ulyses Jarred M.D.   On: 08/13/2019 19:02      Mayrin Schmuck Ginette Otto MD [facility]  If 7PM-7AM, please contact night-coverage   08/13/2019, 9:27 PM

## 2019-08-14 ENCOUNTER — Observation Stay (HOSPITAL_COMMUNITY): Payer: Medicare HMO

## 2019-08-14 ENCOUNTER — Other Ambulatory Visit (HOSPITAL_COMMUNITY): Payer: Self-pay | Admitting: Cardiology

## 2019-08-14 DIAGNOSIS — Z79899 Other long term (current) drug therapy: Secondary | ICD-10-CM | POA: Diagnosis not present

## 2019-08-14 DIAGNOSIS — R270 Ataxia, unspecified: Secondary | ICD-10-CM | POA: Diagnosis not present

## 2019-08-14 DIAGNOSIS — Z7984 Long term (current) use of oral hypoglycemic drugs: Secondary | ICD-10-CM | POA: Diagnosis not present

## 2019-08-14 DIAGNOSIS — F17200 Nicotine dependence, unspecified, uncomplicated: Secondary | ICD-10-CM | POA: Diagnosis present

## 2019-08-14 DIAGNOSIS — I10 Essential (primary) hypertension: Secondary | ICD-10-CM | POA: Diagnosis present

## 2019-08-14 DIAGNOSIS — R4701 Aphasia: Secondary | ICD-10-CM | POA: Diagnosis not present

## 2019-08-14 DIAGNOSIS — I6389 Other cerebral infarction: Secondary | ICD-10-CM | POA: Diagnosis not present

## 2019-08-14 DIAGNOSIS — R200 Anesthesia of skin: Secondary | ICD-10-CM | POA: Diagnosis present

## 2019-08-14 DIAGNOSIS — Z7982 Long term (current) use of aspirin: Secondary | ICD-10-CM | POA: Diagnosis not present

## 2019-08-14 DIAGNOSIS — F4310 Post-traumatic stress disorder, unspecified: Secondary | ICD-10-CM | POA: Diagnosis present

## 2019-08-14 DIAGNOSIS — I6381 Other cerebral infarction due to occlusion or stenosis of small artery: Secondary | ICD-10-CM | POA: Diagnosis present

## 2019-08-14 DIAGNOSIS — E1165 Type 2 diabetes mellitus with hyperglycemia: Secondary | ICD-10-CM | POA: Diagnosis present

## 2019-08-14 DIAGNOSIS — F40240 Claustrophobia: Secondary | ICD-10-CM | POA: Diagnosis present

## 2019-08-14 DIAGNOSIS — E785 Hyperlipidemia, unspecified: Secondary | ICD-10-CM | POA: Diagnosis present

## 2019-08-14 DIAGNOSIS — Z20822 Contact with and (suspected) exposure to covid-19: Secondary | ICD-10-CM | POA: Diagnosis present

## 2019-08-14 DIAGNOSIS — R29700 NIHSS score 0: Secondary | ICD-10-CM | POA: Diagnosis present

## 2019-08-14 DIAGNOSIS — E119 Type 2 diabetes mellitus without complications: Secondary | ICD-10-CM | POA: Diagnosis not present

## 2019-08-14 DIAGNOSIS — Z8673 Personal history of transient ischemic attack (TIA), and cerebral infarction without residual deficits: Secondary | ICD-10-CM | POA: Diagnosis present

## 2019-08-14 DIAGNOSIS — I639 Cerebral infarction, unspecified: Secondary | ICD-10-CM | POA: Diagnosis present

## 2019-08-14 LAB — HEMOGLOBIN A1C
Hgb A1c MFr Bld: 10.3 % — ABNORMAL HIGH (ref 4.8–5.6)
Mean Plasma Glucose: 248.91 mg/dL

## 2019-08-14 LAB — LIPID PANEL
Cholesterol: 234 mg/dL — ABNORMAL HIGH (ref 0–200)
HDL: 39 mg/dL — ABNORMAL LOW (ref >40)
LDL Cholesterol: 121 mg/dL — ABNORMAL HIGH (ref 0–99)
Total CHOL/HDL Ratio: 6 RATIO
Triglycerides: 370 mg/dL — ABNORMAL HIGH (ref <150)
VLDL: 74 mg/dL — ABNORMAL HIGH (ref 0–40)

## 2019-08-14 LAB — HIV ANTIBODY (ROUTINE TESTING W REFLEX): HIV Screen 4th Generation wRfx: NONREACTIVE

## 2019-08-14 LAB — GLUCOSE, CAPILLARY
Glucose-Capillary: 199 mg/dL — ABNORMAL HIGH (ref 70–99)
Glucose-Capillary: 262 mg/dL — ABNORMAL HIGH (ref 70–99)
Glucose-Capillary: 226 mg/dL — ABNORMAL HIGH (ref 70–99)
Glucose-Capillary: 245 mg/dL — ABNORMAL HIGH (ref 70–99)

## 2019-08-14 LAB — MRSA PCR SCREENING: MRSA by PCR: NEGATIVE

## 2019-08-14 LAB — ECHOCARDIOGRAM COMPLETE
Height: 69 in
Weight: 3350.99 oz

## 2019-08-14 MED ORDER — ASPIRIN EC 325 MG PO TBEC
325.0000 mg | DELAYED_RELEASE_TABLET | Freq: Every day | ORAL | Status: DC
  Administered 2019-08-15: 08:00:00 325 mg via ORAL
  Filled 2019-08-14: qty 1

## 2019-08-14 MED ORDER — ACETAMINOPHEN 160 MG/5ML PO SOLN: 650.0000 mg | ORAL | Status: DC | PRN

## 2019-08-14 MED ORDER — ATORVASTATIN CALCIUM 40 MG PO TABS
80.0000 mg | ORAL_TABLET | Freq: Every day | ORAL | Status: DC
  Administered 2019-08-15: 80 mg via ORAL
  Filled 2019-08-14: qty 2

## 2019-08-14 MED ORDER — ACETAMINOPHEN 325 MG PO TABS: 650.0000 mg | ORAL_TABLET | ORAL | Status: DC | PRN

## 2019-08-14 MED ORDER — ENOXAPARIN SODIUM 40 MG/0.4ML ~~LOC~~ SOLN
40.0000 mg | SUBCUTANEOUS | Status: DC
  Administered 2019-08-14: 02:00:00 40 mg via SUBCUTANEOUS
  Filled 2019-08-14 (×2): qty 0.4

## 2019-08-14 MED ORDER — CHLORHEXIDINE GLUCONATE CLOTH 2 % EX PADS
6.0000 | MEDICATED_PAD | Freq: Every day | CUTANEOUS | Status: DC
  Administered 2019-08-14: 6 via TOPICAL

## 2019-08-14 MED ORDER — STROKE: EARLY STAGES OF RECOVERY BOOK: Freq: Once | Status: AC

## 2019-08-14 MED ORDER — ACETAMINOPHEN 650 MG RE SUPP: 650.0000 mg | RECTAL | Status: DC | PRN

## 2019-08-14 MED ORDER — SENNOSIDES-DOCUSATE SODIUM 8.6-50 MG PO TABS: 1.0000 | ORAL_TABLET | Freq: Every evening | ORAL | Status: DC | PRN

## 2019-08-14 MED ORDER — ATORVASTATIN CALCIUM 20 MG PO TABS
20.0000 mg | ORAL_TABLET | Freq: Every day | ORAL | Status: DC
  Administered 2019-08-14: 20 mg via ORAL
  Filled 2019-08-14: qty 1

## 2019-08-14 MED ORDER — ASPIRIN EC 81 MG PO TBEC
81.0000 mg | DELAYED_RELEASE_TABLET | Freq: Every day | ORAL | Status: DC
  Administered 2019-08-14: 11:00:00 81 mg via ORAL
  Filled 2019-08-14: qty 1

## 2019-08-14 MED ORDER — INSULIN ASPART 100 UNIT/ML ~~LOC~~ SOLN
0.0000 [IU] | Freq: Three times a day (TID) | SUBCUTANEOUS | Status: DC
  Administered 2019-08-14: 17:00:00 3 [IU] via SUBCUTANEOUS
  Administered 2019-08-14: 13:00:00 5 [IU] via SUBCUTANEOUS
  Administered 2019-08-14 – 2019-08-15 (×2): 2 [IU] via SUBCUTANEOUS
  Administered 2019-08-15 (×2): 5 [IU] via SUBCUTANEOUS

## 2019-08-14 NOTE — Plan of Care (Signed)
  Problem: Acute Rehab PT Goals(only PT should resolve) Goal: Pt Will Go Supine/Side To Sit Outcome: Progressing Flowsheets (Taken 08/14/2019 1040) Pt will go Supine/Side to Sit: with modified independence Goal: Patient Will Transfer Sit To/From Stand Outcome: Progressing Flowsheets (Taken 08/14/2019 1040) Patient will transfer sit to/from stand:  with supervision  with min guard assist Goal: Pt Will Transfer Bed To Chair/Chair To Bed Outcome: Progressing Flowsheets (Taken 08/14/2019 1040) Pt will Transfer Bed to Chair/Chair to Bed:  with supervision  min guard assist Goal: Pt Will Ambulate Outcome: Progressing Flowsheets (Taken 08/14/2019 1040) Pt will Ambulate:  75 feet  with min guard assist  with minimal assist  with cane  with rolling walker   10:41 AM, 08/14/19 [name], MPT Physical Therapist with [facility] 336 [phone] office [phone] mobile phone

## 2019-08-14 NOTE — Progress Notes (Signed)
PROGRESS NOTE    Philip [name]  [id number] DOB: Nov 10, 1950 DOA: 08/13/2019 PCP: Dettinger, Fransisca Kaufmann, MD   Brief Narrative:  Per HPI: Philip [name] is a 69 y.o. male with medical history significant of hypertension, hyperlipidemia, diabetes mellitus type 2 who presented to the ER with numbness in both upper extremities, both sides of the face which started on Sunday afternoon.  He states he had difficulty with writing and felt like his coordination was off.  He then started having difficulty with walking and worsening coordination.  He reported having difficulty with fine motor skills and difficulty with writing, getting himself dressed, buttoning his shirt.  Continued to have numbness in his fingers. Patient was seen by telemetry neurology in the ER for concern for brainstem stroke versus cervical disease and recommended admission for MRI. Patient has had no nausea, vomiting, abdominal pain, diarrhea, chest pain, shortness of breath, dysuria, seizures, syncope.  4/27: Patient has presented with numbness and ataxia with confirmed CVA noted on brain MRI to the lateral left thalamus region.  He has been seen by physical therapy with recommendations for aggressive inpatient rehabilitation.  2D echocardiogram and neurology consultation still pending.  Assessment & Plan:   Principal Problem:   Numbness Active Problems:   Hypertension   Diabetes ([place])   Hyperlipidemia   PTSD (post-traumatic stress disorder)   Ataxia   CVA (cerebral vascular accident) ([place])   Numbness with ataxia secondary to acute lateral left thalamus CVA -2D echocardiogram and neurology consultation pending -PT recommendations for CIR -LDL 121 -Hemoglobin A1c 10.3% -Continue on statin, high-dose -Change aspirin to full dose -Continue telemetry monitoring  Type 2 diabetes-with hyperglycemia -Hemoglobin A1c 10.3% -Increase SSI coverage -Continue carb modified diet  Hypertension -Currently controlled, but allow  permissive hypertension -Hold lisinopril  Dyslipidemia -LDL 121 -Continue Lipitor at higher dose   DVT prophylaxis: Lovenox Code Status: Full code Family Communication: None at bedside Disposition Plan: Ongoing work-up of CVA with 2D echocardiogram and neurology evaluation pending.  Plan for CIR on discharge if agreeable.   Consultants:   Neurology  Procedures:   See below  Antimicrobials:   None   Subjective: Patient seen and evaluated today and continues to have significant deficits.  He is fearful of his recovery.  Objective: Vitals:   08/14/19 0841 08/14/19 1000 08/14/19 1001 08/14/19 1147  BP:   (!) 163/80   Pulse:      Resp:   18   Temp: (!) 97.5 F (36.4 C)     TempSrc: Oral   (P) Oral  SpO2: 96% 100%    Weight:      Height:       No intake or output data in the 24 hours ending 08/14/19 1302 Filed Weights   08/13/19 1658 08/14/19 0140 08/14/19 0713  Weight: 94.8 kg 95.1 kg 95 kg    Examination:  General exam: Appears calm and comfortable  Respiratory system: Clear to auscultation. Respiratory effort normal. Cardiovascular system: S1 & S2 heard, RRR. No JVD, murmurs, rubs, gallops or clicks. No pedal edema. Gastrointestinal system: Abdomen is nondistended, soft and nontender. No organomegaly or masses felt. Normal bowel sounds heard. Central nervous system: Alert and oriented.  Continues to have trouble with ataxia and numbness. Extremities: No significant edema noted. Skin: No rashes, lesions or ulcers Psychiatry: Judgement and insight appear normal. Mood & affect appropriate.     Data Reviewed: I have personally reviewed following labs and imaging studies  CBC: Recent Labs  Lab 08/13/19 1659 08/13/19 1703  WBC 9.3  --   NEUTROABS 4.7  --   HGB 16.2 16.0  HCT 47.9 47.0  MCV 91.4  --   PLT 324  --    Basic Metabolic Panel: Recent Labs  Lab 08/13/19 1659 08/13/19 1703  NA 135 136  K 3.7 3.8  CL 101 103  CO2 23  --   GLUCOSE  241* 233*  BUN 12 12  CREATININE 0.79 0.80  CALCIUM 10.1  --    GFR: Estimated Creatinine Clearance: 100.5 mL/min (by C-G formula based on SCr of 0.8 mg/dL). Liver Function Tests: Recent Labs  Lab 08/13/19 1659  AST 28  ALT 32  ALKPHOS 82  BILITOT 0.9  PROT 7.7  ALBUMIN 4.4   No results for input(s): LIPASE, AMYLASE in the last 168 hours. No results for input(s): AMMONIA in the last 168 hours. Coagulation Profile: Recent Labs  Lab 08/13/19 1659  INR 1.0   Cardiac Enzymes: No results for input(s): CKTOTAL, CKMB, CKMBINDEX, TROPONINI in the last 168 hours. BNP (last 3 results) No results for input(s): PROBNP in the last 8760 hours. HbA1C: Recent Labs    08/13/19 1659  HGBA1C 10.3*   CBG: Recent Labs  Lab 08/14/19 0804 08/14/19 1150  GLUCAP 199* 262*   Lipid Profile: Recent Labs    08/14/19 0927  CHOL 234*  HDL 39*  LDLCALC 121*  TRIG 370*  CHOLHDL 6.0   Thyroid Function Tests: No results for input(s): TSH, T4TOTAL, FREET4, T3FREE, THYROIDAB in the last 72 hours. Anemia Panel: No results for input(s): VITAMINB12, FOLATE, FERRITIN, TIBC, IRON, RETICCTPCT in the last 72 hours. Sepsis Labs: No results for input(s): PROCALCITON, LATICACIDVEN in the last 168 hours.  Recent Results (from the past 240 hour(s))  Respiratory Panel by RT PCR (Flu A&B, Covid) - Nasopharyngeal Swab     Status: None   Collection Time: 08/13/19  5:00 PM   Specimen: Nasopharyngeal Swab  Result Value Ref Range Status   SARS Coronavirus 2 by RT PCR NEGATIVE NEGATIVE Final    Comment: (NOTE) SARS-CoV-2 target nucleic acids are NOT DETECTED. The SARS-CoV-2 RNA is generally detectable in upper respiratoy specimens during the acute phase of infection. The lowest concentration of SARS-CoV-2 viral copies this assay can detect is 131 copies/mL. A negative result does not preclude SARS-Cov-2 infection and should not be used as the sole basis for treatment or other patient management  decisions. A negative result may occur with  improper specimen collection/handling, submission of specimen other than nasopharyngeal swab, presence of viral mutation(s) within the areas targeted by this assay, and inadequate number of viral copies (<131 copies/mL). A negative result must be combined with clinical observations, patient history, and epidemiological information. The expected result is Negative. Fact Sheet for Patients:  PinkCheek.be Fact Sheet for Healthcare Providers:  GravelBags.it This test is not yet ap proved or cleared by the [place] FDA and  has been authorized for detection and/or diagnosis of SARS-CoV-2 by FDA under an Emergency Use Authorization (EUA). This EUA will remain  in effect (meaning this test can be used) for the duration of the COVID-19 declaration under Section 564(b)(1) of the Act, 21 U.S.C. section 360bbb-3(b)(1), unless the authorization is terminated or revoked sooner.    Influenza A by PCR NEGATIVE NEGATIVE Final   Influenza B by PCR NEGATIVE NEGATIVE Final    Comment: (NOTE) The Xpert Xpress SARS-CoV-2/FLU/RSV assay is intended as an aid in  the diagnosis of influenza from Nasopharyngeal swab specimens and  should  not be used as a sole basis for treatment. Nasal washings and  aspirates are unacceptable for Xpert Xpress SARS-CoV-2/FLU/RSV  testing. Fact Sheet for Patients: PinkCheek.be Fact Sheet for Healthcare Providers: GravelBags.it This test is not yet approved or cleared by the [place] FDA and  has been authorized for detection and/or diagnosis of SARS-CoV-2 by  FDA under an Emergency Use Authorization (EUA). This EUA will remain  in effect (meaning this test can be used) for the duration of the  Covid-19 declaration under Section 564(b)(1) of the Act, 21  U.S.C. section 360bbb-3(b)(1), unless the authorization  is  terminated or revoked. Performed at [facility], [address], [place], [place] [address]   MRSA PCR Screening     Status: None   Collection Time: 08/14/19  1:39 AM   Specimen: Nasal Mucosa; Nasopharyngeal  Result Value Ref Range Status   MRSA by PCR NEGATIVE NEGATIVE Final    Comment:        The GeneXpert MRSA Assay (FDA approved for NASAL specimens only), is one component of a comprehensive MRSA colonization surveillance program. It is not intended to diagnose MRSA infection nor to guide or monitor treatment for MRSA infections. Performed at [facility], [address], [place], [place] [address]          Radiology Studies: CT Angio Head W or Wo Contrast  Result Date: 08/13/2019 CLINICAL DATA:  Numbness and tingling in both upper and lower extremities. EXAM: CT ANGIOGRAPHY HEAD AND NECK TECHNIQUE: Multidetector CT imaging of the head and neck was performed using the standard protocol during bolus administration of intravenous contrast. Multiplanar CT image reconstructions and MIPs were obtained to evaluate the vascular anatomy. Carotid stenosis measurements (when applicable) are obtained utilizing NASCET criteria, using the distal internal carotid diameter as the denominator. CONTRAST:  7mL OMNIPAQUE IOHEXOL 350 MG/ML SOLN COMPARISON:  None. FINDINGS: CT HEAD FINDINGS Brain: There is no mass, hemorrhage or extra-axial collection. The size and configuration of the ventricles and extra-axial CSF spaces are normal. There is no acute or chronic infarction. The brain parenchyma is normal. Skull: The visualized skull base, calvarium and extracranial soft tissues are normal. Sinuses/Orbits: No fluid levels or advanced mucosal thickening of the visualized paranasal sinuses. No mastoid or middle ear effusion. The orbits are normal. CTA NECK FINDINGS SKELETON: There is no bony spinal canal stenosis. No lytic or blastic lesion. OTHER NECK: Normal pharynx, larynx and major salivary glands.  No cervical lymphadenopathy. Unremarkable thyroid gland. UPPER CHEST: No pneumothorax or pleural effusion. No nodules or masses. AORTIC ARCH: There is no calcific atherosclerosis of the aortic arch. There is no aneurysm, dissection or hemodynamically significant stenosis of the visualized portion of the aorta. Conventional 3 vessel aortic branching pattern. The visualized proximal subclavian arteries are widely patent. RIGHT CAROTID SYSTEM: Normal without aneurysm, dissection or stenosis. LEFT CAROTID SYSTEM: Normal without aneurysm, dissection or stenosis. VERTEBRAL ARTERIES: Left dominant configuration. Both origins are clearly patent. There is no dissection, occlusion or flow-limiting stenosis to the skull base (V1-V3 segments). CTA HEAD FINDINGS POSTERIOR CIRCULATION: --Vertebral arteries: Normal V4 segments. --Posterior inferior cerebellar arteries (PICA): Patent origins from the vertebral arteries. --Anterior inferior cerebellar arteries (AICA): Patent origins from the basilar artery. --Basilar artery: Normal. --Superior cerebellar arteries: Normal. --Posterior cerebral arteries: Normal. The right PCA is predominantly supplied by the posterior communicating artery. ANTERIOR CIRCULATION: --Intracranial internal carotid arteries: Normal. --Anterior cerebral arteries (ACA): Normal. Both A1 segments are present. Patent anterior communicating artery (a-comm). --Middle cerebral arteries (MCA): Normal.  VENOUS SINUSES: As permitted by contrast timing, patent. ANATOMIC VARIANTS: Fetal origin of the right posterior cerebral artery. Review of the MIP images confirms the above findings. IMPRESSION: Normal CTA of the head and neck. Electronically Signed   By: Ulyses Jarred M.D.   On: 08/13/2019 19:02   CT Angio Neck W and/or Wo Contrast  Result Date: 08/13/2019 CLINICAL DATA:  Numbness and tingling in both upper and lower extremities. EXAM: CT ANGIOGRAPHY HEAD AND NECK TECHNIQUE: Multidetector CT imaging of the head  and neck was performed using the standard protocol during bolus administration of intravenous contrast. Multiplanar CT image reconstructions and MIPs were obtained to evaluate the vascular anatomy. Carotid stenosis measurements (when applicable) are obtained utilizing NASCET criteria, using the distal internal carotid diameter as the denominator. CONTRAST:  50mL OMNIPAQUE IOHEXOL 350 MG/ML SOLN COMPARISON:  None. FINDINGS: CT HEAD FINDINGS Brain: There is no mass, hemorrhage or extra-axial collection. The size and configuration of the ventricles and extra-axial CSF spaces are normal. There is no acute or chronic infarction. The brain parenchyma is normal. Skull: The visualized skull base, calvarium and extracranial soft tissues are normal. Sinuses/Orbits: No fluid levels or advanced mucosal thickening of the visualized paranasal sinuses. No mastoid or middle ear effusion. The orbits are normal. CTA NECK FINDINGS SKELETON: There is no bony spinal canal stenosis. No lytic or blastic lesion. OTHER NECK: Normal pharynx, larynx and major salivary glands. No cervical lymphadenopathy. Unremarkable thyroid gland. UPPER CHEST: No pneumothorax or pleural effusion. No nodules or masses. AORTIC ARCH: There is no calcific atherosclerosis of the aortic arch. There is no aneurysm, dissection or hemodynamically significant stenosis of the visualized portion of the aorta. Conventional 3 vessel aortic branching pattern. The visualized proximal subclavian arteries are widely patent. RIGHT CAROTID SYSTEM: Normal without aneurysm, dissection or stenosis. LEFT CAROTID SYSTEM: Normal without aneurysm, dissection or stenosis. VERTEBRAL ARTERIES: Left dominant configuration. Both origins are clearly patent. There is no dissection, occlusion or flow-limiting stenosis to the skull base (V1-V3 segments). CTA HEAD FINDINGS POSTERIOR CIRCULATION: --Vertebral arteries: Normal V4 segments. --Posterior inferior cerebellar arteries (PICA): Patent  origins from the vertebral arteries. --Anterior inferior cerebellar arteries (AICA): Patent origins from the basilar artery. --Basilar artery: Normal. --Superior cerebellar arteries: Normal. --Posterior cerebral arteries: Normal. The right PCA is predominantly supplied by the posterior communicating artery. ANTERIOR CIRCULATION: --Intracranial internal carotid arteries: Normal. --Anterior cerebral arteries (ACA): Normal. Both A1 segments are present. Patent anterior communicating artery (a-comm). --Middle cerebral arteries (MCA): Normal. VENOUS SINUSES: As permitted by contrast timing, patent. ANATOMIC VARIANTS: Fetal origin of the right posterior cerebral artery. Review of the MIP images confirms the above findings. IMPRESSION: Normal CTA of the head and neck. Electronically Signed   By: Ulyses Jarred M.D.   On: 08/13/2019 19:02   MR BRAIN WO CONTRAST  Result Date: 08/14/2019 CLINICAL DATA:  69 year old male with upper and lower extremity numbness and tingling. Unrevealing CTA head and neck yesterday. EXAM: MRI HEAD WITHOUT CONTRAST TECHNIQUE: Multiplanar, multiecho pulse sequences of the brain and surrounding structures were obtained without intravenous contrast. COMPARISON:  CT head and CTA head and neck 08/13/2019. FINDINGS: The examination had to be discontinued prior to completion due to claustrophobia. Only axial diffusion weighted imaging could be obtained, but this is of fairly good diagnostic quality. There is a round 8 mm focus of restricted diffusion in the lateral left thalamus near the posterior limb of the left internal capsule (series 1, image 81). Elsewhere diffusion is within normal limits throughout the brain.  No superimposed intracranial mass effect or ventriculomegaly. IMPRESSION: Diffusion only MRI imaging of the brain due to patient claustrophobia - positive for a small acute lacunar infarct in the lateral left thalamus near the posterior limb of the left internal capsule. Electronically  Signed   By: Genevie Ann M.D.   On: 08/14/2019 12:36        Scheduled Meds: . aspirin EC  81 mg Oral Daily  . atorvastatin  20 mg Oral Daily  . Chlorhexidine Gluconate Cloth  6 each Topical Daily  . enoxaparin (LOVENOX) injection  40 mg Subcutaneous Q24H  . insulin aspart  0-9 Units Subcutaneous TID WC   Continuous Infusions:   LOS: 0 days    Time spent: 35 minutes    Kiowa Hollar Darleen Crocker, DO [facility]  If 7PM-7AM, please contact night-coverage [email or web address] 08/14/2019, 1:02 PM

## 2019-08-14 NOTE — Progress Notes (Signed)
Pt is A&O X4. Pt is refusing to wear fall socks and have lab draws done at this time.

## 2019-08-14 NOTE — Progress Notes (Addendum)
SLP Cancellation Note  Patient Details Name: Philip [name] MRN: [id number] DOB: 09/21/1950   Cancelled treatment:       Reason Eval/Treat Not Completed: SLP screened, no needs identified, will sign off; SLP screened Pt in room. Pt denies any changes in swallowing, speech, language, or cognition. MRI shows positive for a small acute lacunar infarct in the lateral left thalamus near the posterior limb of the left internal capsule. SLE will be deferred at this time. Pt reported an occasional hesitation with words, but no difficulty with word finding and no dysarthria present. Pt able to express complex thoughts and feelings without incident. Oral motor exam is WNL. Reconsult if indicated. SLP will sign off.   Thank you,  Genene Churn, [place]  [place] 08/14/2019, 5:39 PM

## 2019-08-14 NOTE — Progress Notes (Signed)
*  PRELIMINARY RESULTS* Echocardiogram 2D Echocardiogram has been performed.  Leavy Cella 08/14/2019, 2:01 PM

## 2019-08-14 NOTE — Plan of Care (Signed)
Discussed smoking cessation and medication compliance post-discharge. Reviewed S/S of stroke. Patient stated he read the provided stroke book and that he has some clinical knowledge and did not have questions at this time - was encouraged to ask questions as needed.

## 2019-08-14 NOTE — TOC Initial Note (Addendum)
Transition of Care [facility]) - Initial/Assessment Note    Patient Details  Name: Philip [name] MRN: [id number] Date of Birth: Oct 20, 1950  [facility] CM/SW Contact:    Boneta Lucks, RN Phone Number: 08/14/2019, 1:33 PM  Clinical Narrative:    Patient admitted with numbness. PT is recommending CIR. TOC called Jhonnie Garner at [facility] to make the referral. Patient has concerns with cost of transport.  Patient gave permission to check with his insurance and refer to CIR, but he is not yet committing to that plan.  Waiting other test results.                 Addendum :  Insurance check - patient has a $290 copay for transport, patient updated, he is waiting to talk to MD to discuss a discharge plan.   Barriers to Discharge: Continued Medical Work up  Patient Goals and CMS Choice     Expected Discharge Plan and Services    Prior Living Arrangements/Services   Activities of Daily Living Home Assistive Devices/Equipment: None ADL Screening (condition at time of admission) Patient's cognitive ability adequate to safely complete daily activities?: Yes Is the patient deaf or have difficulty hearing?: Yes Does the patient have difficulty seeing, even when wearing glasses/contacts?: No Does the patient have difficulty concentrating, remembering, or making decisions?: No Patient able to express need for assistance with ADLs?: Yes Does the patient have difficulty dressing or bathing?: No Independently performs ADLs?: Yes (appropriate for developmental age) Does the patient have difficulty walking or climbing stairs?: No Weakness of Legs: Both Weakness of Arms/Hands: None  Permission Sought/Granted     Emotional Assessment  Orientation: : Oriented to Self, Oriented to Place, Oriented to  Time, Oriented to Situation Alcohol / Substance Use: Not Applicable Psych Involvement: No (comment)  Admission diagnosis:  Numbness [R20.0] Stroke-like symptoms [R29.90] CVA (cerebral vascular  accident) [facility]) [I63.9] Patient Active Problem List   Diagnosis Date Noted  . CVA (cerebral vascular accident) ([place]) 08/14/2019  . Numbness 08/13/2019  . Ataxia 08/13/2019  . S/P lumbar microdiscectomy 08/01/2014  . PTSD (post-traumatic stress disorder) 07/23/2014  . Hyperlipidemia   . Vitamin D deficiency 08/20/2013  . Hypercalcemia 06/19/2013  . Diabetes ([place]) 10/30/2012  . Non compliance with medical treatment 10/30/2012  . Panic disorder 10/30/2012  . Hypertension 09/24/2010   PCP:  Dettinger, Fransisca Kaufmann, MD Pharmacy:   [place] [place] [facility] [place] [address] [place] [address] Phone: [phone] Fax: [phone]

## 2019-08-14 NOTE — Consult Note (Addendum)
[place] A. Merlene Laughter, MD     [email or web address]          Philip [name] is an 69 y.o. male.   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 Dual antiplatelet agents are recommended for 4 weeks. Afterwards, Plavix is recommended. A statin ( or optimization) is also recommended along with usual blood sugar control. The patient will need physical therapy given the significant gait ataxia.     This is a 69 year old ambidextrous white male who presents with the acute onset of numbness involving the right upper extremity. He reports that it alternated to involve symptoms involving the left upper extremity. He now has symptoms involving both the right and left upper extremities mostly on the right side however. He denies any symptoms involving the legs in terms of sensory symptoms. He apparently also had dysesthesia involving the facial region as if his face had cobwebs. He does not specifically reports having dysarthria but seems to report some hesitancy in his speech which is unusual for the patient. The most disabling thing however seem to be marked gait ataxia requiring significant effort to ambulate. He denies any chest pain, palpitation or loss of has consciousness. The patient reports he has been on aspirin 81 mg and has been compliant with this. The review of systems otherwise negative.  GENERAL: This is a pleasant obese male who is in no acute distress.  HEENT: The neck is supple no trauma appreciated.  ABDOMEN: Soft  EXTREMITIES: No edema   BACK: Normal alignment.  SKIN: Normal by inspection.    MENTAL STATUS: Alert and oriented - including his age and the month. Speech - slightly hesitant suggests is of slight receptive aphasia.  However, naming and comprehension are good. Cognition are generally intact. Judgment and insight normal.   CRANIAL NERVES: Pupils are equal, round and reactive to light and accommodation; extraocular movements are full, there is no significant nystagmus; upper and lower facial muscles are normal in strength and symmetric, there is no flattening of the nasolabial folds; tongue is midline; uvula is midline; shoulder elevation is normal.  MOTOR: Normal tone, bulk and strength; no pronator drift.  COORDINATION: Left finger to nose is normal, right finger to nose is normal, No rest tremor; no intention tremor; no postural tremor; no bradykinesia.  REFLEXES: Deep tendon reflexes are symmetrical but somewhat brisk. Plantar responses are flexor bilaterally.   SENSATION: Normal to light touch and temperature.  NIH stroke scale 1 for mild aphasia.   Blood pressure (!) 163/80, pulse 94, temperature (!) 97.5 F (36.4 C), temperature source Oral, resp. rate 18, height 5\' 9"  (1.753 m), weight 95 kg, SpO2 100 %.  Past Medical History:  Diagnosis Date  . Chest pain   . Diabetes mellitus without complication ([place])   . Hyperlipidemia   . Hypertension   . PTSD (post-traumatic stress disorder)     Past Surgical History:  Procedure Laterality Date  . [place]   hernitated and ruptured discs  . LUMBAR LAMINECTOMY/DECOMPRESSION MICRODISCECTOMY Right 08/01/2014   Procedure: Right L/4-5 Extraforaminal Diskectomy;  Surgeon: Eustace Moore, MD;  Location: [facility];  Service: Neurosurgery;  Laterality: Right;  Right L/4-5 Extraforaminal Diskectomy  . TONSILLECTOMY    . TONSILLECTOMY      Family History  Adopted: Yes  Social History:  reports that he has been smoking. He has a 26.50 pack-year smoking history. He has never used smokeless tobacco. He reports that he does not drink alcohol or use drugs.  Allergies:  Allergies  Allergen Reactions  . Ativan [Lorazepam] Other (See Comments)     Caused patient not to remember several days.  Marland Kitchen Blueberry [Vaccinium Angustifolium] Swelling  . Fish Oil   . Shellfish Allergy   . Ultram [Tramadol Hcl] Hives    Medications: Prior to Admission medications   Medication Sig Start Date End Date Taking? Authorizing Provider  aspirin EC 81 MG tablet Take 81 mg by mouth daily.   Yes [provider]  atorvastatin (LIPITOR) 20 MG tablet Take 1 tablet  every OD Patient taking differently: Take 20 mg by mouth every other day.  10/26/18  Yes Dettinger, Fransisca Kaufmann, MD  Vitamin D, Ergocalciferol, (DRISDOL) 1.25 MG (50000 UT) CAPS capsule Take 1 capsule (50,000 Units total) by mouth every 7 (seven) days. 10/27/18  Yes Dettinger, Fransisca Kaufmann, MD  lisinopril (ZESTRIL) 20 MG tablet Take 1 tablet (20 mg total) by mouth daily. Patient not taking: Reported on 08/13/2019 10/20/18   Dettinger, Fransisca Kaufmann, MD  metFORMIN (GLUCOPHAGE) 1000 MG tablet Take 1 tablet (1,000 mg total) by mouth 2 (two) times daily with a meal. Patient not taking: Reported on 08/13/2019 10/20/18   Dettinger, Fransisca Kaufmann, MD    Scheduled Meds: . [START ON 08/15/2019] aspirin EC  325 mg Oral Daily  . [START ON 08/15/2019] atorvastatin  80 mg Oral Daily  . Chlorhexidine Gluconate Cloth  6 each Topical Daily  . enoxaparin (LOVENOX) injection  40 mg Subcutaneous Q24H  . insulin aspart  0-9 Units Subcutaneous TID WC   Continuous Infusions: PRN Meds:.acetaminophen **OR** acetaminophen (TYLENOL) oral liquid 160 mg/5 mL **OR** acetaminophen, senna-docusate     Results for orders placed or performed during the hospital encounter of 08/13/19 (from the past 48 hour(s))  Ethanol     Status: None   Collection Time: 08/13/19  4:59 PM  Result Value Ref Range   Alcohol, Ethyl (B) <10 <10 mg/dL    Comment: (NOTE) Lowest detectable limit for serum alcohol is 10 mg/dL. For medical purposes only. Performed at [facility], [address], [place], [place] [address]   Protime-INR     Status: None    Collection Time: 08/13/19  4:59 PM  Result Value Ref Range   Prothrombin Time 12.7 11.4 - 15.2 seconds   INR 1.0 0.8 - 1.2    Comment: (NOTE) INR goal varies based on device and disease states. Performed at [facility], [address] , [place] [address]   APTT     Status: None   Collection Time: 08/13/19  4:59 PM  Result Value Ref Range   aPTT 30 24 - 36 seconds    Comment: Performed at [facility], [address], [place], [place] [address]  CBC     Status: None   Collection Time: 08/13/19  4:59 PM  Result Value Ref Range   WBC 9.3 4.0 - 10.5 K/uL   RBC 5.24 4.22 - 5.81 MIL/uL   Hemoglobin 16.2 13.0 - 17.0 g/dL   HCT 47.9 39.0 - 52.0 %   MCV 91.4 80.0 - 100.0 fL   MCH 30.9 26.0 - 34.0 pg   MCHC 33.8 30.0 - 36.0 g/dL   RDW 12.5 11.5 - 15.5 %   Platelets 324 150 - 400 K/uL   nRBC 0.0 0.0 -  0.2 %    Comment: Performed at [facility], [address], [place], [place] [address]  Differential     Status: None   Collection Time: 08/13/19  4:59 PM  Result Value Ref Range   Neutrophils Relative % 51 %   Neutro Abs 4.7 1.7 - 7.7 K/uL   Lymphocytes Relative 37 %   Lymphs Abs 3.5 0.7 - 4.0 K/uL   Monocytes Relative 9 %   Monocytes Absolute 0.9 0.1 - 1.0 K/uL   Eosinophils Relative 2 %   Eosinophils Absolute 0.2 0.0 - 0.5 K/uL   Basophils Relative 1 %   Basophils Absolute 0.1 0.0 - 0.1 K/uL   Immature Granulocytes 0 %   Abs Immature Granulocytes 0.02 0.00 - 0.07 K/uL    Comment: Performed at [facility], [address], [place], [place] [address]  Comprehensive metabolic panel     Status: Abnormal   Collection Time: 08/13/19  4:59 PM  Result Value Ref Range   Sodium 135 135 - 145 mmol/L   Potassium 3.7 3.5 - 5.1 mmol/L   Chloride 101 98 - 111 mmol/L   CO2 23 22 - 32 mmol/L   Glucose, Bld 241 (H) 70 - 99 mg/dL    Comment: Glucose reference range applies only to samples taken after fasting for at least 8 hours.   BUN 12 8 - 23 mg/dL   Creatinine, Ser 0.79 0.61 - 1.24  mg/dL   Calcium 10.1 8.9 - 10.3 mg/dL   Total Protein 7.7 6.5 - 8.1 g/dL   Albumin 4.4 3.5 - 5.0 g/dL   AST 28 15 - 41 U/L   ALT 32 0 - 44 U/L   Alkaline Phosphatase 82 38 - 126 U/L   Total Bilirubin 0.9 0.3 - 1.2 mg/dL   GFR calc non Af Amer >60 >60 mL/min   GFR calc Af Amer >60 >60 mL/min   Anion gap 11 5 - 15    Comment: Performed at [facility], [address], [place], [place] [address]  Urine rapid drug screen (hosp performed)     Status: None   Collection Time: 08/13/19  4:59 PM  Result Value Ref Range   Opiates NONE DETECTED NONE DETECTED   Cocaine NONE DETECTED NONE DETECTED   Benzodiazepines NONE DETECTED NONE DETECTED   Amphetamines NONE DETECTED NONE DETECTED   Tetrahydrocannabinol NONE DETECTED NONE DETECTED   Barbiturates NONE DETECTED NONE DETECTED    Comment: (NOTE) DRUG SCREEN FOR MEDICAL PURPOSES ONLY.  IF CONFIRMATION IS NEEDED FOR ANY PURPOSE, NOTIFY LAB WITHIN 5 DAYS. LOWEST DETECTABLE LIMITS FOR URINE DRUG SCREEN Drug Class                     Cutoff (ng/mL) Amphetamine and metabolites    1000 Barbiturate and metabolites    200 Benzodiazepine                 [id number] Tricyclics and metabolites     300 Opiates and metabolites        300 Cocaine and metabolites        300 THC                            50 Performed at [facility], [address], [place], [place] [address]   Urinalysis, Routine w reflex microscopic     Status: Abnormal   Collection Time: 08/13/19  4:59 PM  Result Value Ref Range  Color, Urine YELLOW YELLOW   APPearance CLEAR CLEAR   Specific Gravity, Urine 1.005 1.005 - 1.030   pH 6.0 5.0 - 8.0   Glucose, UA >=500 (A) NEGATIVE mg/dL   Hgb urine dipstick NEGATIVE NEGATIVE   Bilirubin Urine NEGATIVE NEGATIVE   Ketones, ur NEGATIVE NEGATIVE mg/dL   Protein, ur NEGATIVE NEGATIVE mg/dL   Nitrite NEGATIVE NEGATIVE   Leukocytes,Ua NEGATIVE NEGATIVE   RBC / HPF 0-5 0 - 5 RBC/hpf   WBC, UA 0-5 0 - 5 WBC/hpf   Bacteria, UA RARE (A) NONE  SEEN   Hyaline Casts, UA PRESENT     Comment: Performed at [facility], [address] [place], [place] [address]  Hemoglobin A1c     Status: Abnormal   Collection Time: 08/13/19  4:59 PM  Result Value Ref Range   Hgb A1c MFr Bld 10.3 (H) 4.8 - 5.6 %    Comment: (NOTE) Pre diabetes:          5.7%-6.4% Diabetes:              >6.4% Glycemic control for   <7.0% adults with diabetes    Mean Plasma Glucose 248.91 mg/dL    Comment: Performed at [facility] [address], [place], [place] [address]  Respiratory Panel by RT PCR (Flu A&B, Covid) - Nasopharyngeal Swab     Status: None   Collection Time: 08/13/19  5:00 PM   Specimen: Nasopharyngeal Swab  Result Value Ref Range   SARS Coronavirus 2 by RT PCR NEGATIVE NEGATIVE    Comment: (NOTE) SARS-CoV-2 target nucleic acids are NOT DETECTED. The SARS-CoV-2 RNA is generally detectable in upper respiratoy specimens during the acute phase of infection. The lowest concentration of SARS-CoV-2 viral copies this assay can detect is 131 copies/mL. A negative result does not preclude SARS-Cov-2 infection and should not be used as the sole basis for treatment or other patient management decisions. A negative result may occur with  improper specimen collection/handling, submission of specimen other than nasopharyngeal swab, presence of viral mutation(s) within the areas targeted by this assay, and inadequate number of viral copies (<131 copies/mL). A negative result must be combined with clinical observations, patient history, and epidemiological information. The expected result is Negative. Fact Sheet for Patients:  PinkCheek.be Fact Sheet for Healthcare Providers:  GravelBags.it This test is not yet ap proved or cleared by the [place] FDA and  has been authorized for detection and/or diagnosis of SARS-CoV-2 by FDA under an Emergency Use Authorization (EUA). This EUA will  remain  in effect (meaning this test can be used) for the duration of the COVID-19 declaration under Section 564(b)(1) of the Act, 21 U.S.C. section 360bbb-3(b)(1), unless the authorization is terminated or revoked sooner.    Influenza A by PCR NEGATIVE NEGATIVE   Influenza B by PCR NEGATIVE NEGATIVE    Comment: (NOTE) The Xpert Xpress SARS-CoV-2/FLU/RSV assay is intended as an aid in  the diagnosis of influenza from Nasopharyngeal swab specimens and  should not be used as a sole basis for treatment. Nasal washings and  aspirates are unacceptable for Xpert Xpress SARS-CoV-2/FLU/RSV  testing. Fact Sheet for Patients: PinkCheek.be Fact Sheet for Healthcare Providers: GravelBags.it This test is not yet approved or cleared by the [place] FDA and  has been authorized for detection and/or diagnosis of SARS-CoV-2 by  FDA under an Emergency Use Authorization (EUA). This EUA will remain  in effect (meaning this test can be used) for the duration of  the  Covid-19 declaration under Section 564(b)(1) of the Act, 21  U.S.C. section 360bbb-3(b)(1), unless the authorization is  terminated or revoked. Performed at [facility], [address], [place], [place] [address]   I-stat chem 8, ed     Status: Abnormal   Collection Time: 08/13/19  5:03 PM  Result Value Ref Range   Sodium 136 135 - 145 mmol/L   Potassium 3.8 3.5 - 5.1 mmol/L   Chloride 103 98 - 111 mmol/L   BUN 12 8 - 23 mg/dL   Creatinine, Ser 0.80 0.61 - 1.24 mg/dL   Glucose, Bld 233 (H) 70 - 99 mg/dL    Comment: Glucose reference range applies only to samples taken after fasting for at least 8 hours.   Calcium, Ion 1.32 1.15 - 1.40 mmol/L   TCO2 28 22 - 32 mmol/L   Hemoglobin 16.0 13.0 - 17.0 g/dL   HCT 47.0 39.0 - 52.0 %  MRSA PCR Screening     Status: None   Collection Time: 08/14/19  1:39 AM   Specimen: Nasal Mucosa; Nasopharyngeal  Result Value Ref Range   MRSA  by PCR NEGATIVE NEGATIVE    Comment:        The GeneXpert MRSA Assay (FDA approved for NASAL specimens only), is one component of a comprehensive MRSA colonization surveillance program. It is not intended to diagnose MRSA infection nor to guide or monitor treatment for MRSA infections. Performed at [facility], [address] [place], [place] [address]   Glucose, capillary     Status: Abnormal   Collection Time: 08/14/19  8:04 AM  Result Value Ref Range   Glucose-Capillary 199 (H) 70 - 99 mg/dL    Comment: Glucose reference range applies only to samples taken after fasting for at least 8 hours.  Lipid panel     Status: Abnormal   Collection Time: 08/14/19  9:27 AM  Result Value Ref Range   Cholesterol 234 (H) 0 - 200 mg/dL   Triglycerides 370 (H) <150 mg/dL   HDL 39 (L) >40 mg/dL   Total CHOL/HDL Ratio 6.0 RATIO   VLDL 74 (H) 0 - 40 mg/dL   LDL Cholesterol 121 (H) 0 - 99 mg/dL    Comment:        Total Cholesterol/HDL:CHD Risk Coronary Heart Disease Risk Table                     Men   Women  1/2 Average Risk   3.4   3.3  Average Risk       5.0   4.4  2 X Average Risk   9.6   7.1  3 X Average Risk  23.4   11.0        Use the calculated Patient Ratio above and the CHD Risk Table to determine the patient's CHD Risk.        ATP III CLASSIFICATION (LDL):  <100     mg/dL   Optimal  100-129  mg/dL   Near or Above                    Optimal  130-159  mg/dL   Borderline  160-189  mg/dL   High  >190     mg/dL   Very High Performed at [facility] [place], [place] [address]   Glucose, capillary     Status: Abnormal   Collection Time: 08/14/19 11:50 AM  Result Value Ref Range  Glucose-Capillary 262 (H) 70 - 99 mg/dL    Comment: Glucose reference range applies only to samples taken after fasting for at least 8 hours.    Studies/Results:  BRAIN MRI FINDINGS: The examination had to be discontinued prior to completion due to claustrophobia. Only axial  diffusion weighted imaging could be obtained, but this is of fairly good diagnostic quality.  There is a round 8 mm focus of restricted diffusion in the lateral left thalamus near the posterior limb of the left internal capsule (series 1, image 81). Elsewhere diffusion is within normal limits throughout the brain.  No superimposed intracranial mass effect or ventriculomegaly.  IMPRESSION: Diffusion only MRI imaging of the brain due to patient claustrophobia - positive for a small acute lacunar infarct in the lateral left thalamus near the posterior limb of the left internal capsule.    HEAD NECK CTA FINDINGS: CT HEAD FINDINGS  Brain: There is no mass, hemorrhage or extra-axial collection. The size and configuration of the ventricles and extra-axial CSF spaces are normal. There is no acute or chronic infarction. The brain parenchyma is normal.  Skull: The visualized skull base, calvarium and extracranial soft tissues are normal.  Sinuses/Orbits: No fluid levels or advanced mucosal thickening of the visualized paranasal sinuses. No mastoid or middle ear effusion. The orbits are normal.  CTA NECK FINDINGS  SKELETON: There is no bony spinal canal stenosis. No lytic or blastic lesion.  OTHER NECK: Normal pharynx, larynx and major salivary glands. No cervical lymphadenopathy. Unremarkable thyroid gland.  UPPER CHEST: No pneumothorax or pleural effusion. No nodules or masses.  AORTIC ARCH:  There is no calcific atherosclerosis of the aortic arch. There is no aneurysm, dissection or hemodynamically significant stenosis of the visualized portion of the aorta. Conventional 3 vessel aortic branching pattern. The visualized proximal subclavian arteries are widely patent.  RIGHT CAROTID SYSTEM: Normal without aneurysm, dissection or stenosis.  LEFT CAROTID SYSTEM: Normal without aneurysm, dissection or stenosis.  VERTEBRAL ARTERIES: Left dominant  configuration. Both origins are clearly patent. There is no dissection, occlusion or flow-limiting stenosis to the skull base (V1-V3 segments).  CTA HEAD FINDINGS  POSTERIOR CIRCULATION:  --Vertebral arteries: Normal V4 segments.  --Posterior inferior cerebellar arteries (PICA): Patent origins from the vertebral arteries.  --Anterior inferior cerebellar arteries (AICA): Patent origins from the basilar artery.  --Basilar artery: Normal.  --Superior cerebellar arteries: Normal.  --Posterior cerebral arteries: Normal. The right PCA is predominantly supplied by the posterior communicating artery.  ANTERIOR CIRCULATION:  --Intracranial internal carotid arteries: Normal.  --Anterior cerebral arteries (ACA): Normal. Both A1 segments are present. Patent anterior communicating artery (a-comm).  --Middle cerebral arteries (MCA): Normal.  VENOUS SINUSES: As permitted by contrast timing, patent.  ANATOMIC VARIANTS: Fetal origin of the right posterior cerebral artery.  Review of the MIP images confirms the above findings.  IMPRESSION: Normal CTA of the head and neck.    TTE ECHOCARDIOGRAM REPORT   IMPRESSIONS    1. Images are limited due to acoustic windows.  2. Left ventricular ejection fraction, by estimation, is 55 to 60%. The  left ventricle has normal function. Left ventricular endocardial border  not optimally defined to evaluate regional wall motion. Left ventricular  diastolic parameters are  indeterminate.  3. Right ventricular systolic function is normal. The right ventricular  size is normal. Tricuspid regurgitation signal is inadequate for assessing  PA pressure.  4. The mitral valve is grossly normal. Trivial mitral valve  regurgitation.  5. The aortic valve was not well visualized.  Aortic valve regurgitation  is not visualized.  6. The inferior vena cava is normal in size with greater than 50%  respiratory variability,  suggesting right atrial pressure of 3 mmHg.     The brain MRI scan is reviewed in person. Only DWI is done and shows a small infarct involving the left thalamic area.     TTE 1. Images are limited due to acoustic windows.  2. Left ventricular ejection fraction, by estimation, is 55 to 60%. The  left ventricle has normal function. Left ventricular endocardial border  not optimally defined to evaluate regional wall motion. Left ventricular  diastolic parameters are  indeterminate.  3. Right ventricular systolic function is normal. The right ventricular  size is normal. Tricuspid regurgitation signal is inadequate for assessing  PA pressure.  4. The mitral valve is grossly normal. Trivial mitral valve  regurgitation.  5. The aortic valve was not well visualized. Aortic valve regurgitation  is not visualized.  6. The inferior vena cava is normal in size with greater than 50%  respiratory variability, suggesting right atrial pressure of 3 mmHg.     Ziah Turvey A. Merlene Laughter, M.D.  Diplomate, Tax adviser of Psychiatry and Neurology ( Neurology). 08/14/2019, 2:48 PM

## 2019-08-14 NOTE — Evaluation (Signed)
Physical Therapy Evaluation Patient Details Name: Philip [name] MRN: [id number] DOB: Jun 19, 1950 Today's Date: 08/14/2019   History of Present Illness  Philip [name] is a 69 y.o. male with medical history significant of hypertension, hyperlipidemia, diabetes mellitus type 2 who presented to the ER with numbness in both upper extremities, both sides of the face which started on Sunday afternoon.  He states he had difficulty with writing and felt like his coordination was off.  He then started having difficulty with walking and worsening coordination.  He reported having difficulty with fine motor skills and difficulty with writing, getting himself dressed, buttoning his shirt.  Continued to have numbness in his fingers.Patient was seen by telemetry neurology in the ER for concern for brainstem stroke versus cervical disease and recommended admission for MRI.Patient has had no nausea, vomiting, abdominal pain, diarrhea, chest pain, shortness of breath, dysuria, seizures, syncope.    Clinical Impression  Patient has to concentrate requiring increased time to complete functional tasks due to poor coordination.  Patient demonstrates slightly labored movement for sitting up at bedside and had difficulty advancing BLE when taking steps resulting in rigid like movement requiring increased time, during ambulation has to frequently lean on nearby objects for support due to fall risk, very unsteady on feet with steppage like gait pattern to clear right toes, can heel to toe step on right foot after verbal cueing, but has to slow cadence in order to do safely with assistance.  Patient tolerated sitting up in chair after therapy to eat breakfast - RN aware.  Patient will benefit from continued physical therapy in hospital and recommended venue below to increase strength, balance, endurance for safe ADLs and gait.    Follow Up Recommendations CIR    Equipment Recommendations  Rolling walker with 5" wheels     Recommendations for Other Services       Precautions / Restrictions Precautions Precautions: Fall Restrictions Weight Bearing Restrictions: No      Mobility  Bed Mobility Overal bed mobility: Needs Assistance Bed Mobility: Supine to Sit     Supine to sit: Supervision     General bed mobility comments: increased time, slightly labored movement  Transfers Overall transfer level: Needs assistance Equipment used: None Transfers: Sit to/from Stand;Stand Pivot Transfers Sit to Stand: Min guard;Min assist Stand pivot transfers: Min guard;Min assist       General transfer comment: increased time, labored movement  Ambulation/Gait Ambulation/Gait assistance: Min assist;Mod assist Gait Distance (Feet): 45 Feet Assistive device: None Gait Pattern/deviations: Decreased step length - left;Decreased stance time - right;Decreased step length - right;Decreased dorsiflexion - right;Steppage Gait velocity: decreased   General Gait Details: slow labored cadence frequently lifting RLE higher than normal to clear toes, can heel to toe steps after verbal cues, but very unsteady having to slow cadence to control BLE due to poor coordination, frequent leaning on nearby objects for support, limited secondary to fatigue  Stairs            Wheelchair Mobility    Modified Rankin (Stroke Patients Only)       Balance Overall balance assessment: Needs assistance Sitting-balance support: Feet supported;No upper extremity supported Sitting balance-Leahy Scale: Good Sitting balance - Comments: seated at EOB   Standing balance support: During functional activity;No upper extremity supported Standing balance-Leahy Scale: Poor Standing balance comment: fair/poor without AD, has to lean on nearby objects for support  Pertinent Vitals/Pain Pain Assessment: Faces Faces Pain Scale: Hurts a little bit Pain Location: numbness in hands and face,  spider web feeling on face Pain Descriptors / Indicators: Numbness Pain Intervention(s): Limited activity within patient's tolerance;Monitored during session    [place] expects to be discharged to:: Private residence Living Arrangements: Alone Available Help at Discharge: Friend(s);Available PRN/intermittently(Patient states he has no help) Type of Home: House Home Access: Stairs to enter Entrance Stairs-Rails: Left Entrance Stairs-Number of Steps: 14 Home Layout: One level Home Equipment: Cane - single point Additional Comments: patient states he sleeps on the floor and has no furniture in house other than an office chair    Prior Function Level of Independence: Independent         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, drives     Hand Dominance   Dominant Hand: Right    Extremity/Trunk Assessment   Upper Extremity Assessment Upper Extremity Assessment: Defer to OT evaluation    Lower Extremity Assessment Lower Extremity Assessment: Overall WFL for tasks assessed    Cervical / Trunk Assessment Cervical / Trunk Assessment: Normal  Communication   Communication: No difficulties  Cognition Arousal/Alertness: Awake/alert Behavior During Therapy: WFL for tasks assessed/performed Overall Cognitive Status: Within Functional Limits for tasks assessed                                        General Comments      Exercises     Assessment/Plan    PT Assessment Patient needs continued PT services  PT Problem List Decreased activity tolerance;Decreased balance;Decreased mobility;Decreased coordination       PT Treatment Interventions Gait training;DME instruction;Stair training;Functional mobility training;Therapeutic activities;Therapeutic exercise;Patient/family education;Balance training;Neuromuscular re-education    PT Goals (Current goals can be found in the Care Plan section)  Acute Rehab PT Goals Patient Stated Goal: Return home  able to walk safely PT Goal Formulation: With patient Time For Goal Achievement: 08/28/19 Potential to Achieve Goals: Good    Frequency 7X/week   Barriers to discharge        Co-evaluation               AM-PAC PT "6 Clicks" Mobility  Outcome Measure Help needed turning from your back to your side while in a flat bed without using bedrails?: None Help needed moving from lying on your back to sitting on the side of a flat bed without using bedrails?: None Help needed moving to and from a bed to a chair (including a wheelchair)?: A Lot Help needed standing up from a chair using your arms (e.g., wheelchair or bedside chair)?: A Little Help needed to walk in hospital room?: A Lot Help needed climbing 3-5 steps with a railing? : A Lot 6 Click Score: 17    End of Session Equipment Utilized During Treatment: Gait belt Activity Tolerance: Patient tolerated treatment well;Patient limited by fatigue Patient left: in chair;with call bell/phone within reach Nurse Communication: Mobility status PT Visit Diagnosis: Unsteadiness on feet (R26.81);Other abnormalities of gait and mobility (R26.89);Muscle weakness (generalized) (M62.81)    Time: [id number] PT Time Calculation (min) (ACUTE ONLY): 28 min   Charges:   PT Evaluation $PT Eval Moderate Complexity: 1 Mod PT Treatments $Therapeutic Activity: 23-37 mins        10:26 AM, 08/14/19 [name], MPT Physical Therapist with [facility] [phone] office [phone] mobile phone

## 2019-08-15 DIAGNOSIS — R270 Ataxia, unspecified: Secondary | ICD-10-CM

## 2019-08-15 DIAGNOSIS — I10 Essential (primary) hypertension: Secondary | ICD-10-CM

## 2019-08-15 DIAGNOSIS — E119 Type 2 diabetes mellitus without complications: Secondary | ICD-10-CM

## 2019-08-15 LAB — GLUCOSE, CAPILLARY
Glucose-Capillary: 186 mg/dL — ABNORMAL HIGH (ref 70–99)
Glucose-Capillary: 268 mg/dL — ABNORMAL HIGH (ref 70–99)
Glucose-Capillary: 277 mg/dL — ABNORMAL HIGH (ref 70–99)

## 2019-08-15 MED ORDER — ATORVASTATIN CALCIUM 40 MG PO TABS: 40.0000 mg | ORAL_TABLET | Freq: Every day | ORAL | 0 refills | Status: DC

## 2019-08-15 MED ORDER — LISINOPRIL 20 MG PO TABS: 20.0000 mg | ORAL_TABLET | Freq: Every day | ORAL | 3 refills | Status: DC

## 2019-08-15 MED ORDER — METFORMIN HCL 1000 MG PO TABS: 1000.0000 mg | ORAL_TABLET | Freq: Two times a day (BID) | ORAL | 3 refills | Status: DC

## 2019-08-15 MED ORDER — GLIPIZIDE 5 MG PO TABS: 5.0000 mg | ORAL_TABLET | Freq: Every day | ORAL | 11 refills | Status: DC

## 2019-08-15 MED ORDER — CLOPIDOGREL BISULFATE 75 MG PO TABS
75.0000 mg | ORAL_TABLET | Freq: Every day | ORAL | 11 refills | Status: DC
Start: 2019-08-15 — End: 2019-09-21

## 2019-08-15 NOTE — Discharge Summary (Signed)
Physician Discharge Summary  Anan Hoard [id number] DOB: [birth year] DOA: 08/13/2019  PCP: Dettinger, Fransisca Kaufmann, MD  Admit date: 08/13/2019 Discharge date: 08/15/2019  Admitted From: Home Disposition: Home  Recommendations for Outpatient Follow-up:  1. Follow up with PCP in 1-2 weeks 2. Please obtain BMP/CBC in one week 3. Follow-up with neurology in 4 weeks  Home Health: Home health PT recommended, but patient would like to do outpatient PT Equipment/Devices:  Discharge Condition: Stable CODE STATUS:full code Diet recommendation: heart healthy/carb modified  Brief/Interim Summary: 69 year old male admitted to the hospital with acute onset of numbness in the right upper extremity.  MRI of the brain confirmed left thalamic infarct with likely extension involving the internal capsule, posterior limb.  He was seen by neurology with recommendations for dual antiplatelet therapy for the next 4 weeks after which to continue on Plavix.  His statin dose was increased to 40 mg daily.  He was restarted on lisinopril for blood pressure control.  A1c was noted to be elevated at 10.  Glipizide was added to Metformin since patient does not wish to start insulin at this time.  He will need further follow-up with his primary care physician to further address his diabetes.  Imaging of his neck did not show any significant stenosis in carotid arteries bilaterally.  Echocardiogram was also unrevealing.  He will follow-up with neurology in the next 4 weeks.  He was seen by physical therapy who recommended home health PT, but patient wishes to pursue outpatient PT.  He feels stable for discharge home.  Discharge Diagnoses:  Principal Problem:   Numbness Active Problems:   Hypertension   Diabetes ([place])   Hyperlipidemia   PTSD (post-traumatic stress disorder)   Ataxia   CVA (cerebral vascular accident) [facility])    Discharge Instructions  Discharge Instructions    Ambulatory referral to Physical Therapy    Complete by: As directed    Diet - low sodium heart healthy   Complete by: As directed    Increase activity slowly   Complete by: As directed      Allergies as of 08/15/2019      Reactions   Ativan [lorazepam] Other (See Comments)   Caused patient not to remember several days.   Blueberry [vaccinium Angustifolium] Swelling   Fish Oil    Shellfish Allergy    Ultram [tramadol Hcl] Hives      Medication List    TAKE these medications   aspirin EC 81 MG tablet Take 81 mg by mouth daily.   atorvastatin 40 MG tablet Commonly known as: LIPITOR Take 1 tablet (40 mg total) by mouth daily. What changed:   medication strength  how much to take  how to take this  when to take this  additional instructions   clopidogrel 75 MG tablet Commonly known as: Plavix Take 1 tablet (75 mg total) by mouth daily.   glipiZIDE 5 MG tablet Commonly known as: GLUCOTROL Take 1 tablet (5 mg total) by mouth daily before breakfast.   lisinopril 20 MG tablet Commonly known as: ZESTRIL Take 1 tablet (20 mg total) by mouth daily.   metFORMIN 1000 MG tablet Commonly known as: GLUCOPHAGE Take 1 tablet (1,000 mg total) by mouth 2 (two) times daily with a meal.   Vitamin D (Ergocalciferol) 1.25 MG (50000 UNIT) Caps capsule Commonly known as: DRISDOL Take 1 capsule (50,000 Units total) by mouth every 7 (seven) days.      Follow-up Information    Phillips Odor, MD. Schedule an  appointment as soon as possible for a visit in 4 week(s).   Specialty: Neurology Contact information: [address] [place] [address] [phone]        Dettinger, Fransisca Kaufmann, MD Follow up.   Specialties: Family Medicine, Cardiology Why: as scheduled Contact information: [address] [place] [address] [phone]          Allergies  Allergen Reactions  . Ativan [Lorazepam] Other (See Comments)    Caused patient not to remember several days.  Marland Kitchen Blueberry [Vaccinium Angustifolium]  Swelling  . Fish Oil   . Shellfish Allergy   . Ultram [Tramadol Hcl] Hives    Consultations:  Neurology   Procedures/Studies: CT Angio Head W or Wo Contrast  Result Date: 08/13/2019 CLINICAL DATA:  Numbness and tingling in both upper and lower extremities. EXAM: CT ANGIOGRAPHY HEAD AND NECK TECHNIQUE: Multidetector CT imaging of the head and neck was performed using the standard protocol during bolus administration of intravenous contrast. Multiplanar CT image reconstructions and MIPs were obtained to evaluate the vascular anatomy. Carotid stenosis measurements (when applicable) are obtained utilizing NASCET criteria, using the distal internal carotid diameter as the denominator. CONTRAST:  96mL OMNIPAQUE IOHEXOL 350 MG/ML SOLN COMPARISON:  None. FINDINGS: CT HEAD FINDINGS Brain: There is no mass, hemorrhage or extra-axial collection. The size and configuration of the ventricles and extra-axial CSF spaces are normal. There is no acute or chronic infarction. The brain parenchyma is normal. Skull: The visualized skull base, calvarium and extracranial soft tissues are normal. Sinuses/Orbits: No fluid levels or advanced mucosal thickening of the visualized paranasal sinuses. No mastoid or middle ear effusion. The orbits are normal. CTA NECK FINDINGS SKELETON: There is no bony spinal canal stenosis. No lytic or blastic lesion. OTHER NECK: Normal pharynx, larynx and major salivary glands. No cervical lymphadenopathy. Unremarkable thyroid gland. UPPER CHEST: No pneumothorax or pleural effusion. No nodules or masses. AORTIC ARCH: There is no calcific atherosclerosis of the aortic arch. There is no aneurysm, dissection or hemodynamically significant stenosis of the visualized portion of the aorta. Conventional 3 vessel aortic branching pattern. The visualized proximal subclavian arteries are widely patent. RIGHT CAROTID SYSTEM: Normal without aneurysm, dissection or stenosis. LEFT CAROTID SYSTEM: Normal without  aneurysm, dissection or stenosis. VERTEBRAL ARTERIES: Left dominant configuration. Both origins are clearly patent. There is no dissection, occlusion or flow-limiting stenosis to the skull base (V1-V3 segments). CTA HEAD FINDINGS POSTERIOR CIRCULATION: --Vertebral arteries: Normal V4 segments. --Posterior inferior cerebellar arteries (PICA): Patent origins from the vertebral arteries. --Anterior inferior cerebellar arteries (AICA): Patent origins from the basilar artery. --Basilar artery: Normal. --Superior cerebellar arteries: Normal. --Posterior cerebral arteries: Normal. The right PCA is predominantly supplied by the posterior communicating artery. ANTERIOR CIRCULATION: --Intracranial internal carotid arteries: Normal. --Anterior cerebral arteries (ACA): Normal. Both A1 segments are present. Patent anterior communicating artery (a-comm). --Middle cerebral arteries (MCA): Normal. VENOUS SINUSES: As permitted by contrast timing, patent. ANATOMIC VARIANTS: Fetal origin of the right posterior cerebral artery. Review of the MIP images confirms the above findings. IMPRESSION: Normal CTA of the head and neck. Electronically Signed   By: Ulyses Jarred M.D.   On: 08/13/2019 19:02   CT Angio Neck W and/or Wo Contrast  Result Date: 08/13/2019 CLINICAL DATA:  Numbness and tingling in both upper and lower extremities. EXAM: CT ANGIOGRAPHY HEAD AND NECK TECHNIQUE: Multidetector CT imaging of the head and neck was performed using the standard protocol during bolus administration of intravenous contrast. Multiplanar CT image reconstructions and MIPs were  obtained to evaluate the vascular anatomy. Carotid stenosis measurements (when applicable) are obtained utilizing NASCET criteria, using the distal internal carotid diameter as the denominator. CONTRAST:  73mL OMNIPAQUE IOHEXOL 350 MG/ML SOLN COMPARISON:  None. FINDINGS: CT HEAD FINDINGS Brain: There is no mass, hemorrhage or extra-axial collection. The size and  configuration of the ventricles and extra-axial CSF spaces are normal. There is no acute or chronic infarction. The brain parenchyma is normal. Skull: The visualized skull base, calvarium and extracranial soft tissues are normal. Sinuses/Orbits: No fluid levels or advanced mucosal thickening of the visualized paranasal sinuses. No mastoid or middle ear effusion. The orbits are normal. CTA NECK FINDINGS SKELETON: There is no bony spinal canal stenosis. No lytic or blastic lesion. OTHER NECK: Normal pharynx, larynx and major salivary glands. No cervical lymphadenopathy. Unremarkable thyroid gland. UPPER CHEST: No pneumothorax or pleural effusion. No nodules or masses. AORTIC ARCH: There is no calcific atherosclerosis of the aortic arch. There is no aneurysm, dissection or hemodynamically significant stenosis of the visualized portion of the aorta. Conventional 3 vessel aortic branching pattern. The visualized proximal subclavian arteries are widely patent. RIGHT CAROTID SYSTEM: Normal without aneurysm, dissection or stenosis. LEFT CAROTID SYSTEM: Normal without aneurysm, dissection or stenosis. VERTEBRAL ARTERIES: Left dominant configuration. Both origins are clearly patent. There is no dissection, occlusion or flow-limiting stenosis to the skull base (V1-V3 segments). CTA HEAD FINDINGS POSTERIOR CIRCULATION: --Vertebral arteries: Normal V4 segments. --Posterior inferior cerebellar arteries (PICA): Patent origins from the vertebral arteries. --Anterior inferior cerebellar arteries (AICA): Patent origins from the basilar artery. --Basilar artery: Normal. --Superior cerebellar arteries: Normal. --Posterior cerebral arteries: Normal. The right PCA is predominantly supplied by the posterior communicating artery. ANTERIOR CIRCULATION: --Intracranial internal carotid arteries: Normal. --Anterior cerebral arteries (ACA): Normal. Both A1 segments are present. Patent anterior communicating artery (a-comm). --Middle cerebral  arteries (MCA): Normal. VENOUS SINUSES: As permitted by contrast timing, patent. ANATOMIC VARIANTS: Fetal origin of the right posterior cerebral artery. Review of the MIP images confirms the above findings. IMPRESSION: Normal CTA of the head and neck. Electronically Signed   By: Ulyses Jarred M.D.   On: 08/13/2019 19:02   MR BRAIN WO CONTRAST  Result Date: 08/14/2019 CLINICAL DATA:  69 year old male with upper and lower extremity numbness and tingling. Unrevealing CTA head and neck yesterday. EXAM: MRI HEAD WITHOUT CONTRAST TECHNIQUE: Multiplanar, multiecho pulse sequences of the brain and surrounding structures were obtained without intravenous contrast. COMPARISON:  CT head and CTA head and neck 08/13/2019. FINDINGS: The examination had to be discontinued prior to completion due to claustrophobia. Only axial diffusion weighted imaging could be obtained, but this is of fairly good diagnostic quality. There is a round 8 mm focus of restricted diffusion in the lateral left thalamus near the posterior limb of the left internal capsule (series 1, image 81). Elsewhere diffusion is within normal limits throughout the brain. No superimposed intracranial mass effect or ventriculomegaly. IMPRESSION: Diffusion only MRI imaging of the brain due to patient claustrophobia - positive for a small acute lacunar infarct in the lateral left thalamus near the posterior limb of the left internal capsule. Electronically Signed   By: Genevie Ann M.D.   On: 08/14/2019 12:36   ECHOCARDIOGRAM COMPLETE  Result Date: 08/14/2019    ECHOCARDIOGRAM REPORT   Patient Name:   HADLEY HANDRICH Date of Exam: 08/14/2019 Medical Rec #:  [id number]    Height:       69.0 in Accession #:    [id number]   Weight:  209.4 lb Date of Birth:  14-Apr-1951    BSA:          2.107 m Patient Age:    69 years     BP:           163/80 mmHg Patient Gender: M            HR:           103 bpm. Exam Location:  [facility] Procedure: 2D Echo Indications:    TIA 435.9 /  G45.9  History:        Patient has no prior history of Echocardiogram examinations.                 Stroke; Risk Factors:Dyslipidemia, Diabetes and Hypertension.                 PTSD.  Sonographer:    Leavy Cella RDCS (AE) Referring Phys: [place]  1. Images are limited due to acoustic windows.  2. Left ventricular ejection fraction, by estimation, is 55 to 60%. The left ventricle has normal function. Left ventricular endocardial border not optimally defined to evaluate regional wall motion. Left ventricular diastolic parameters are indeterminate.  3. Right ventricular systolic function is normal. The right ventricular size is normal. Tricuspid regurgitation signal is inadequate for assessing PA pressure.  4. The mitral valve is grossly normal. Trivial mitral valve regurgitation.  5. The aortic valve was not well visualized. Aortic valve regurgitation is not visualized.  6. The inferior vena cava is normal in size with greater than 50% respiratory variability, suggesting right atrial pressure of 3 mmHg. FINDINGS  Left Ventricle: Left ventricular ejection fraction, by estimation, is 55 to 60%. The left ventricle has normal function. Left ventricular endocardial border not optimally defined to evaluate regional wall motion. The left ventricular internal cavity size was normal in size. There is no left ventricular hypertrophy. Left ventricular diastolic parameters are indeterminate. Right Ventricle: The right ventricular size is normal. No increase in right ventricular wall thickness. Right ventricular systolic function is normal. Tricuspid regurgitation signal is inadequate for assessing PA pressure. Left Atrium: Left atrial size was normal in size. Right Atrium: Right atrial size was normal in size. Pericardium: There is no evidence of pericardial effusion. Presence of pericardial fat pad. Mitral Valve: The mitral valve is grossly normal. Mild mitral annular calcification. Trivial  mitral valve regurgitation. Tricuspid Valve: The tricuspid valve is grossly normal. Tricuspid valve regurgitation is trivial. Aortic Valve: The aortic valve was not well visualized. Aortic valve regurgitation is not visualized. Pulmonic Valve: The pulmonic valve was not well visualized. Pulmonic valve regurgitation is not visualized. Aorta: The aortic root is normal in size and structure. Venous: The inferior vena cava is normal in size with greater than 50% respiratory variability, suggesting right atrial pressure of 3 mmHg. IAS/Shunts: No atrial level shunt detected by color flow Doppler.  LEFT VENTRICLE PLAX 2D LVOT diam:     1.90 cm  Diastology LVOT Area:     2.84 cm LV e' lateral:   6.31 cm/s                         LV E/e' lateral: 16.2                         LV e' medial:    7.83 cm/s  LV E/e' medial:  13.0  RIGHT VENTRICLE RV S prime:     13.10 cm/s TAPSE (M-mode): 2.6 cm LEFT ATRIUM             Index LA Vol (A2C):   48.9 ml 23.21 ml/m LA Vol (A4C):   26.9 ml 12.77 ml/m LA Biplane Vol: 37.2 ml 17.66 ml/m  MITRAL VALVE MV Area (PHT): 3.60 cm     SHUNTS MV Decel Time: 211 msec     Systemic Diam: 1.90 cm MV E velocity: 102.00 cm/s MV A velocity: 103.00 cm/s MV E/A ratio:  0.99 Rozann Lesches MD Electronically signed by Rozann Lesches MD Signature Date/Time: 08/14/2019/2:27:27 PM    Final        Subjective: Patient is feeling better.  Feels the numbness and right hand is better.  He feels like gait is improving.  Discharge Exam: Vitals:   08/15/19 0553 08/15/19 0900 08/15/19 0925 08/15/19 1341  BP: (!) 149/89 (!) 149/88  (!) 143/91  Pulse: 89 (!) 110 98 98  Resp: 20 18  18   Temp: 98.9 F (37.2 C) 97.7 F (36.5 C)    TempSrc: Oral Oral    SpO2: 98% 98%  99%  Weight:      Height:        General: Pt is alert, awake, not in acute distress Cardiovascular: RRR, S1/S2 +, no rubs, no gallops Respiratory: CTA bilaterally, no wheezing, no rhonchi Abdominal: Soft,  NT, ND, bowel sounds + Extremities: no edema, no cyanosis    The results of significant diagnostics from this hospitalization (including imaging, microbiology, ancillary and laboratory) are listed below for reference.     Microbiology: Recent Results (from the past 240 hour(s))  Respiratory Panel by RT PCR (Flu A&B, Covid) - Nasopharyngeal Swab     Status: None   Collection Time: 08/13/19  5:00 PM   Specimen: Nasopharyngeal Swab  Result Value Ref Range Status   SARS Coronavirus 2 by RT PCR NEGATIVE NEGATIVE Final    Comment: (NOTE) SARS-CoV-2 target nucleic acids are NOT DETECTED. The SARS-CoV-2 RNA is generally detectable in upper respiratoy specimens during the acute phase of infection. The lowest concentration of SARS-CoV-2 viral copies this assay can detect is 131 copies/mL. A negative result does not preclude SARS-Cov-2 infection and should not be used as the sole basis for treatment or other patient management decisions. A negative result may occur with  improper specimen collection/handling, submission of specimen other than nasopharyngeal swab, presence of viral mutation(s) within the areas targeted by this assay, and inadequate number of viral copies (<131 copies/mL). A negative result must be combined with clinical observations, patient history, and epidemiological information. The expected result is Negative. Fact Sheet for Patients:  PinkCheek.be Fact Sheet for Healthcare Providers:  GravelBags.it This test is not yet ap proved or cleared by the [place] FDA and  has been authorized for detection and/or diagnosis of SARS-CoV-2 by FDA under an Emergency Use Authorization (EUA). This EUA will remain  in effect (meaning this test can be used) for the duration of the COVID-19 declaration under Section 564(b)(1) of the Act, 21 U.S.C. section 360bbb-3(b)(1), unless the authorization is terminated or revoked  sooner.    Influenza A by PCR NEGATIVE NEGATIVE Final   Influenza B by PCR NEGATIVE NEGATIVE Final    Comment: (NOTE) The Xpert Xpress SARS-CoV-2/FLU/RSV assay is intended as an aid in  the diagnosis of influenza from Nasopharyngeal swab specimens and  should not be used as a sole  basis for treatment. Nasal washings and  aspirates are unacceptable for Xpert Xpress SARS-CoV-2/FLU/RSV  testing. Fact Sheet for Patients: PinkCheek.be Fact Sheet for Healthcare Providers: GravelBags.it This test is not yet approved or cleared by the [place] FDA and  has been authorized for detection and/or diagnosis of SARS-CoV-2 by  FDA under an Emergency Use Authorization (EUA). This EUA will remain  in effect (meaning this test can be used) for the duration of the  Covid-19 declaration under Section 564(b)(1) of the Act, 21  U.S.C. section 360bbb-3(b)(1), unless the authorization is  terminated or revoked. Performed at [facility], [address] [place], [place] [address]   MRSA PCR Screening     Status: None   Collection Time: 08/14/19  1:39 AM   Specimen: Nasal Mucosa; Nasopharyngeal  Result Value Ref Range Status   MRSA by PCR NEGATIVE NEGATIVE Final    Comment:        The GeneXpert MRSA Assay (FDA approved for NASAL specimens only), is one component of a comprehensive MRSA colonization surveillance program. It is not intended to diagnose MRSA infection nor to guide or monitor treatment for MRSA infections. Performed at [facility], [address], [place],  [address]      Labs: BNP (last 3 results) No results for input(s): BNP in the last 8760 hours. Basic Metabolic Panel: Recent Labs  Lab 08/13/19 1659 08/13/19 1703  NA 135 136  K 3.7 3.8  CL 101 103  CO2 23  --   GLUCOSE 241* 233*  BUN 12 12  CREATININE 0.79 0.80  CALCIUM 10.1  --    Liver Function Tests: Recent Labs  Lab 08/13/19 1659  AST 28   ALT 32  ALKPHOS 82  BILITOT 0.9  PROT 7.7  ALBUMIN 4.4   No results for input(s): LIPASE, AMYLASE in the last 168 hours. No results for input(s): AMMONIA in the last 168 hours. CBC: Recent Labs  Lab 08/13/19 1659 08/13/19 1703  WBC 9.3  --   NEUTROABS 4.7  --   HGB 16.2 16.0  HCT 47.9 47.0  MCV 91.4  --   PLT 324  --    Cardiac Enzymes: No results for input(s): CKTOTAL, CKMB, CKMBINDEX, TROPONINI in the last 168 hours. BNP: Invalid input(s): POCBNP CBG: Recent Labs  Lab 08/14/19 1708 08/14/19 2013 08/15/19 0719 08/15/19 1120 08/15/19 1617  GLUCAP 226* 245* 186* 268* 277*   D-Dimer No results for input(s): DDIMER in the last 72 hours. Hgb A1c Recent Labs    08/13/19 1659  HGBA1C 10.3*   Lipid Profile Recent Labs    08/14/19 0927  CHOL 234*  HDL 39*  LDLCALC 121*  TRIG 370*  CHOLHDL 6.0   Thyroid function studies No results for input(s): TSH, T4TOTAL, T3FREE, THYROIDAB in the last 72 hours.  Invalid input(s): FREET3 Anemia work up No results for input(s): VITAMINB12, FOLATE, FERRITIN, TIBC, IRON, RETICCTPCT in the last 72 hours. Urinalysis    Component Value Date/Time   COLORURINE YELLOW 08/13/2019 1659   APPEARANCEUR CLEAR 08/13/2019 1659   APPEARANCEUR Clear 10/20/2018 0916   LABSPEC 1.005 08/13/2019 1659   PHURINE 6.0 08/13/2019 1659   GLUCOSEU >=500 (A) 08/13/2019 1659   HGBUR NEGATIVE 08/13/2019 1659   BILIRUBINUR NEGATIVE 08/13/2019 1659   BILIRUBINUR Negative 10/20/2018 0916   KETONESUR NEGATIVE 08/13/2019 1659   PROTEINUR NEGATIVE 08/13/2019 1659   UROBILINOGEN 4.0 (H) 07/31/2014 1550   NITRITE NEGATIVE 08/13/2019 1659   LEUKOCYTESUR NEGATIVE 08/13/2019 1659   Sepsis  Labs Invalid input(s): PROCALCITONIN,  WBC,  LACTICIDVEN Microbiology Recent Results (from the past 240 hour(s))  Respiratory Panel by RT PCR (Flu A&B, Covid) - Nasopharyngeal Swab     Status: None   Collection Time: 08/13/19  5:00 PM   Specimen: Nasopharyngeal  Swab  Result Value Ref Range Status   SARS Coronavirus 2 by RT PCR NEGATIVE NEGATIVE Final    Comment: (NOTE) SARS-CoV-2 target nucleic acids are NOT DETECTED. The SARS-CoV-2 RNA is generally detectable in upper respiratoy specimens during the acute phase of infection. The lowest concentration of SARS-CoV-2 viral copies this assay can detect is 131 copies/mL. A negative result does not preclude SARS-Cov-2 infection and should not be used as the sole basis for treatment or other patient management decisions. A negative result may occur with  improper specimen collection/handling, submission of specimen other than nasopharyngeal swab, presence of viral mutation(s) within the areas targeted by this assay, and inadequate number of viral copies (<131 copies/mL). A negative result must be combined with clinical observations, patient history, and epidemiological information. The expected result is Negative. Fact Sheet for Patients:  PinkCheek.be Fact Sheet for Healthcare Providers:  GravelBags.it This test is not yet ap proved or cleared by the [place] FDA and  has been authorized for detection and/or diagnosis of SARS-CoV-2 by FDA under an Emergency Use Authorization (EUA). This EUA will remain  in effect (meaning this test can be used) for the duration of the COVID-19 declaration under Section 564(b)(1) of the Act, 21 U.S.C. section 360bbb-3(b)(1), unless the authorization is terminated or revoked sooner.    Influenza A by PCR NEGATIVE NEGATIVE Final   Influenza B by PCR NEGATIVE NEGATIVE Final    Comment: (NOTE) The Xpert Xpress SARS-CoV-2/FLU/RSV assay is intended as an aid in  the diagnosis of influenza from Nasopharyngeal swab specimens and  should not be used as a sole basis for treatment. Nasal washings and  aspirates are unacceptable for Xpert Xpress SARS-CoV-2/FLU/RSV  testing. Fact Sheet for  Patients: PinkCheek.be Fact Sheet for Healthcare Providers: GravelBags.it This test is not yet approved or cleared by the [place] FDA and  has been authorized for detection and/or diagnosis of SARS-CoV-2 by  FDA under an Emergency Use Authorization (EUA). This EUA will remain  in effect (meaning this test can be used) for the duration of the  Covid-19 declaration under Section 564(b)(1) of the Act, 21  U.S.C. section 360bbb-3(b)(1), unless the authorization is  terminated or revoked. Performed at [facility], [address] [place], [place] [address]   MRSA PCR Screening     Status: None   Collection Time: 08/14/19  1:39 AM   Specimen: Nasal Mucosa; Nasopharyngeal  Result Value Ref Range Status   MRSA by PCR NEGATIVE NEGATIVE Final    Comment:        The GeneXpert MRSA Assay (FDA approved for NASAL specimens only), is one component of a comprehensive MRSA colonization surveillance program. It is not intended to diagnose MRSA infection nor to guide or monitor treatment for MRSA infections. Performed at [facility], [address], [place],  [address]      Time coordinating discharge: 12mins  SIGNED:   Kathie Dike, MD  [facility] 08/15/2019, 9:04 PM   If 7PM-7AM, please contact night-coverage [email or web address]

## 2019-08-15 NOTE — Evaluation (Signed)
Occupational Therapy Evaluation Patient Details Name: Philip [name] MRN: [id number] DOB: 07-Feb-1951 Today's Date: 08/15/2019    History of Present Illness Philip [name] is a 69 y.o. male with medical history significant of hypertension, hyperlipidemia, diabetes mellitus type 2 who presented to the ER with numbness in both upper extremities, both sides of the face which started on Sunday afternoon.  He states he had difficulty with writing and felt like his coordination was off.  He then started having difficulty with walking and worsening coordination.  He reported having difficulty with fine motor skills and difficulty with writing, getting himself dressed, buttoning his shirt.  Continued to have numbness in his fingers.Patient was seen by telemetry neurology in the ER for concern for brainstem stroke versus cervical disease and recommended admission for MRI.Patient has had no nausea, vomiting, abdominal pain, diarrhea, chest pain, shortness of breath, dysuria, seizures, syncope.   Clinical Impression   Pt agreeable to OT evaluation this am, reports he has been practicing his coordination overnight. Pt demonstrating much improved coordination since admission, no difficulty with fine motor skills during ADLs today. Pt also performing functional mobility around room with supervision, occasional unsteadiness if he rushes and moves too fast, however overall good balance during ADLs. Only occasionally reaching for wall or object to steady himself. Pt has made vast improvements since yesterday per chart review and discussion with PT. No further OT services required at this time.     Follow Up Recommendations  No OT follow up    Equipment Recommendations  None recommended by OT       Precautions / Restrictions Precautions Precautions: Fall Restrictions Weight Bearing Restrictions: No      Mobility Bed Mobility Overal bed mobility: Independent                Transfers Overall transfer  level: Independent                        ADL either performed or assessed with clinical judgement   ADL Overall ADL's : Needs assistance/impaired     Grooming: Wash/dry hands;Wash/dry face;Supervision/safety;Standing Grooming Details (indicate cue type and reason): pt performing grooming tasks standing at sink         Upper Body Dressing : Modified independent;Sitting;Standing       Toilet Transfer: Modified Independent;Ambulation   Toileting- Clothing Manipulation and Hygiene: Modified independent;Sit to/from stand       Functional mobility during ADLs: Supervision/safety General ADL Comments: Pt walking around room carefully, standing on one foot, squatting down to floor without difficulty. Occasionally reaching for objects to steady himself however infrequent     Vision Baseline Vision/History: No visual deficits Patient Visual Report: No change from baseline Vision Assessment?: No apparent visual deficits            Pertinent Vitals/Pain Pain Assessment: No/denies pain     Hand Dominance Right   Extremity/Trunk Assessment Upper Extremity Assessment Upper Extremity Assessment: Overall WFL for tasks assessed   Lower Extremity Assessment Lower Extremity Assessment: Defer to PT evaluation   Cervical / Trunk Assessment Cervical / Trunk Assessment: Normal   Communication Communication Communication: No difficulties   Cognition Arousal/Alertness: Awake/alert Behavior During Therapy: WFL for tasks assessed/performed Overall Cognitive Status: Within Functional Limits for tasks assessed  Home Living Family/patient expects to be discharged to:: Private residence Living Arrangements: Alone   Type of Home: House Home Access: Stairs to enter Technical brewer of Steps: 14 Entrance Stairs-Rails: Left Home Layout: One level     Bathroom Shower/Tub: Engineer, site: [place] - single point   Additional Comments: patient states he sleeps on the floor and has no furniture in house other than an office chair      Prior Functioning/Environment Level of Independence: Independent        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, drives        OT Problem List: Decreased activity tolerance;Impaired balance (sitting and/or standing)       End of Session    Activity Tolerance: Patient tolerated treatment well Patient left: in bed;with call bell/phone within reach  OT Visit Diagnosis: Unsteadiness on feet (R26.81);Muscle weakness (generalized) (M62.81)                Time: [id number] OT Time Calculation (min): 15 min Charges:  OT General Charges $OT Visit: 1 Visit OT Evaluation $OT Eval Low Complexity: [place], OTR/L  [phone] 08/15/2019, 7:59 AM

## 2019-08-15 NOTE — Progress Notes (Signed)
Inpatient Rehab Admissions:  Inpatient Rehab Consult received.  Chart reviewed.  Pt with no OT/SLP needs.  In order to be a candidate for CIR, pt would need to have therapy needs in at least 2 disciplines.  Will sign off at this time.  TOC team aware.   Signed: Shann Medal, PT, DPT Admissions Coordinator [phone] 08/15/19  11:56 AM

## 2019-08-15 NOTE — Care Management (Signed)
Patient much improved. Now recommended for home health vs outpatient. Patient discussed with attending, only interested in outpatient PT. Order placed.

## 2019-08-15 NOTE — Progress Notes (Signed)
Patient refusing morning lab draw.  Explained to the patient the importance of labs and patient still refusing stating that he doesn't need them.  On-call MD notified via text page.  Will continue to monitor.

## 2019-08-15 NOTE — Progress Notes (Addendum)
Physical Therapy Treatment Patient Details Name: Philip [name] MRN: [id number] DOB: 08-04-1950 Today's Date: 08/15/2019    History of Present Illness Philip [name] is a 69 y.o. male with medical history significant of hypertension, hyperlipidemia, diabetes mellitus type 2 who presented to the ER with numbness in both upper extremities, both sides of the face which started on Sunday afternoon.  He states he had difficulty with writing and felt like his coordination was off.  He then started having difficulty with walking and worsening coordination.  He reported having difficulty with fine motor skills and difficulty with writing, getting himself dressed, buttoning his shirt.  Continued to have numbness in his fingers.Patient was seen by telemetry neurology in the ER for concern for brainstem stroke versus cervical disease and recommended admission for MRI.Patient has had no nausea, vomiting, abdominal pain, diarrhea, chest pain, shortness of breath, dysuria, seizures, syncope.    PT Comments    Pt. friendly and willing to participate with therapy today.  Pt progressing well towards POC.  Reports increased feeling with 10% numbness Rt LE.  Pt sitting in chair upon entrance and able to increase distance with gait, tendency to weave down hallway and cueing for equal stride length to improve gait mechanics, no LOB.  Pt able to complete step to pattern up and down stairs safely with supervision.   Therex complete for balance and strengthening with good form.  EOS pt was limited by fatigue with decreased heel strike noted.  Pt left in chair with call bell within reach.  No reports of pain today.     Follow Up Recommendations  Home health PT     Equipment Recommendations  Rolling walker with 5" wheels    Recommendations for Other Services       Precautions / Restrictions Precautions Precautions: Fall Restrictions Weight Bearing Restrictions: No    Mobility  Bed Mobility Overal bed mobility:  Independent             General bed mobility comments: pt sitting in chair upon entrance  Transfers Overall transfer level: Independent Equipment used: None Transfers: Sit to/from Stand Sit to Stand: Supervision            Ambulation/Gait   Gait Distance (Feet): 110 Feet Assistive device: None Gait Pattern/deviations: Decreased step length - left;Decreased stance time - right;Decreased step length - right;Decreased dorsiflexion - right;Steppage Gait velocity: decreased   General Gait Details: increased cadence, occassional drifting, improved foot clearance no LOB, limited by fatiuge   Stairs Stairs: Yes Stairs assistance: Supervision Stair Management: One rail Right Number of Stairs: 8 General stair comments: step to pattern with 1 HR A   Wheelchair Mobility    Modified Rankin (Stroke Patients Only)       Balance                                            Cognition Arousal/Alertness: Awake/alert Behavior During Therapy: WFL for tasks assessed/performed Overall Cognitive Status: Within Functional Limits for tasks assessed                                        Exercises General Exercises - Lower Extremity Heel Raises: 10 reps Mini-Sqauts: 10 reps Other Exercises Other Exercises: tandem stance 2x 20"; SLS x 3  General Comments        Pertinent Vitals/Pain Pain Assessment: 0-10    Home Living                      Prior Function            PT Goals (current goals can now be found in the care plan section)      Frequency    7X/week      PT Plan      Co-evaluation              AM-PAC PT "6 Clicks" Mobility   Outcome Measure  Help needed turning from your back to your side while in a flat bed without using bedrails?: None Help needed moving from lying on your back to sitting on the side of a flat bed without using bedrails?: None Help needed moving to and from a bed to a chair  (including a wheelchair)?: A Little Help needed standing up from a chair using your arms (e.g., wheelchair or bedside chair)?: A Little Help needed to walk in hospital room?: A Little Help needed climbing 3-5 steps with a railing? : A Little 6 Click Score: 20    End of Session Equipment Utilized During Treatment: Gait belt Activity Tolerance: Patient tolerated treatment well;Patient limited by fatigue Patient left: in chair;with call bell/phone within reach Nurse Communication: Mobility status PT Visit Diagnosis: Unsteadiness on feet (R26.81);Other abnormalities of gait and mobility (R26.89);Muscle weakness (generalized) (M62.81)     Time: [id number] PT Time Calculation (min) (ACUTE ONLY): 18 min  Charges:  $Therapeutic Activity: 8-22 mins                     Ihor Austin, LPTA/CLT; CBIS [phone]  [name] 08/15/2019, 1:44 PM

## 2019-08-15 NOTE — Progress Notes (Signed)
Inpatient Diabetes Program Recommendations  AACE/ADA: New Consensus Statement on Inpatient Glycemic Control (2015)  Target Ranges:  Prepandial:   less than 140 mg/dL      Peak postprandial:   less than 180 mg/dL (1-2 hours)      Critically ill patients:  140 - 180 mg/dL   Lab Results  Component Value Date   GLUCAP 186 (H) 08/15/2019   HGBA1C 10.3 (H) 08/13/2019    Review of Glycemic Control  Diabetes history: DM 2 Outpatient Diabetes medications: Metformin 1000 mg bid Current orders for Inpatient glycemic control:  Novolog 0-9 units tid  Inpatient Diabetes Program Recommendations:     Add Glipizide 5 mg Daily   Spoke with pt over the phone regarding A1c and DM control. Pt last saw his PCP July last year and has not had a check up since. His PCP office was not able to work with him easily in scheduling appts due to [place].  Pt reports a fear in needles and says it took him " 3 weeks to be able to check his own glucose."  Discussed current A1c level of 10.3%. Discussed glucose and A1c goals. Pt familiar with DM and lifestyle modifications. [facility] but has not been able to go recently and last year. Spoke with pt about exercising locally even walking around his yard.   Discussed the possibly need for insulin. Pt has questions about the injectables once a week. Spoke with pt about getting a referral from his PCP for Endocrinology in his area and going to see the specialist.   Recommend starting Glipizide daily for now to see how low his glucose trends go. Pt only on correction scale now with glucose 186 and 268 today.  Emailed pt video on how to use insulin pen. Pt wants to talk to his PCP about insulin at his follow up. Described importance of calling his PCP if glucose is above goal.  Pt uses [employer] meter and supplies. Pt receives 4-5 meals on wheels. Eats wheat breads. But can improve some on diet.  Pt to check glucose 1-2 times  a day. Pt has only checked glucose when he felt ad in the past as he "ould not do anything about it." Spoke with pt about when to call PCP and that glucose trends are always changing.   Thanks,  Tama Headings RN, MSN, BC-ADM Inpatient Diabetes Coordinator Team Pager [phone] (8a-5p)

## 2019-08-16 ENCOUNTER — Telehealth: Payer: Self-pay | Admitting: *Deleted

## 2019-08-16 NOTE — Telephone Encounter (Signed)
    TRANSITIONAL CARE MANAGEMENT TELEPHONE OUTREACH NOTE   Contact Date: 08/16/2019 Contacted By: Lynnea Ferrier, LPN   DISCHARGE INFORMATION Date of Discharge:08/15/2019 Discharge Facility: [facility] Discharge Diagnosis: Numbness   Outpatient Follow Up Recommendations (copied from discharge summary) Follow up with PCP in 1-2 weeks Please obtain BMP/CBC in one week  Follow-up with neurology in 4 weeks  Philip [name] is a male primary care patient of Dettinger, Fransisca Kaufmann, MD. An outgoing telephone call was made today and I spoke with patient.  Philip [name] condition(s) and treatment(s) were discussed. An opportunity to ask questions was provided and all were answered or forwarded as appropriate.    ACTIVITIES OF DAILY LIVING  Philip [name] lives alone and he can perform ADLs independently. his primary caregiver is patient. he is able to depend on his primary caregiver(s) for consistent help. Transportation to appointments, to pick up medications, and to run errands is a problem.  (Consider referral to [facility] if transportation or a consistent caregiver is a problem)   Fall Risk Fall Risk  12/29/2018 10/20/2018  Falls in the past year? 0 0  Number falls in past yr: - -  Injury with Fall? - -    medium [place] Modifications/Assistive Devices Wheelchair: No Cane: Yes Ramp: No Bedside Toilet: No Hospital Bed:  No Other:    [place] he is not receiving home health services.     MEDICATION RECONCILIATION  Philip [name] has been able to pick-up all prescribed discharge medications from the pharmacy.   A post discharge medication reconciliation was performed and the complete medication list was reviewed with the patient/caregiver and is current as of 08/16/2019. Changes highlighted below.  Discontinued Medications none  Current Medication List Allergies as of 08/16/2019      Reactions   Ativan [lorazepam] Other (See Comments)   Caused patient not  to remember several days.   Blueberry [vaccinium Angustifolium] Swelling   Fish Oil    Shellfish Allergy    Ultram [tramadol Hcl] Hives      Medication List       Accurate as of August 16, 2019  9:06 AM. If you have any questions, ask your nurse or doctor.        aspirin EC 81 MG tablet Take 81 mg by mouth daily.   atorvastatin 40 MG tablet Commonly known as: LIPITOR Take 1 tablet (40 mg total) by mouth daily.   clopidogrel 75 MG tablet Commonly known as: Plavix Take 1 tablet (75 mg total) by mouth daily.   glipiZIDE 5 MG tablet Commonly known as: GLUCOTROL Take 1 tablet (5 mg total) by mouth daily before breakfast.   lisinopril 20 MG tablet Commonly known as: ZESTRIL Take 1 tablet (20 mg total) by mouth daily.   metFORMIN 1000 MG tablet Commonly known as: GLUCOPHAGE Take 1 tablet (1,000 mg total) by mouth 2 (two) times daily with a meal.   Vitamin D (Ergocalciferol) 1.25 MG (50000 UNIT) Caps capsule Commonly known as: DRISDOL Take 1 capsule (50,000 Units total) by mouth every 7 (seven) days.        PATIENT EDUCATION & FOLLOW-UP PLAN  An appointment for Transitional Care Management is scheduled with Dettinger, Fransisca Kaufmann, MD on 08/24/2019 at 11:10am.  Take all medications as prescribed  Contact our office by calling [phone] if you have any questions or concerns

## 2019-08-24 ENCOUNTER — Ambulatory Visit (INDEPENDENT_AMBULATORY_CARE_PROVIDER_SITE_OTHER): Payer: Medicare HMO | Admitting: Family Medicine

## 2019-08-24 ENCOUNTER — Other Ambulatory Visit: Payer: Self-pay

## 2019-08-24 ENCOUNTER — Encounter: Payer: Self-pay | Admitting: Family Medicine

## 2019-08-24 VITALS — BP 157/92 | HR 100 | Temp 97.1°F | Ht 69.0 in | Wt 213.5 lb

## 2019-08-24 DIAGNOSIS — R278 Other lack of coordination: Secondary | ICD-10-CM | POA: Diagnosis not present

## 2019-08-24 DIAGNOSIS — R2689 Other abnormalities of gait and mobility: Secondary | ICD-10-CM

## 2019-08-24 DIAGNOSIS — E1149 Type 2 diabetes mellitus with other diabetic neurological complication: Secondary | ICD-10-CM | POA: Diagnosis not present

## 2019-08-24 DIAGNOSIS — Z8673 Personal history of transient ischemic attack (TIA), and cerebral infarction without residual deficits: Secondary | ICD-10-CM

## 2019-08-24 MED ORDER — RYBELSUS 3 MG PO TABS: 3.0000 mg | ORAL_TABLET | Freq: Every day | ORAL | 3 refills | Status: DC

## 2019-08-24 NOTE — Patient Instructions (Addendum)
Double baby aspirin and take 2/day  Placed referral to see physical therapy and neurology  Also make an appointment for Philip [name] clinical pharmacist in our office for diabetes.

## 2019-08-24 NOTE — Progress Notes (Signed)
BP (!) 157/92   Pulse 100   Temp (!) 97.1 F (36.2 C) (Temporal)   Ht 5\' 9"  (1.753 m)   Wt 213 lb 8 oz (96.8 kg)   BMI 31.53 kg/m    Subjective:   Patient ID: Philip [name], male    DOB: 09-14-1950, 69 y.o.   MRN: [id number]  HPI: Philip [name] is a 69 y.o. male presenting on 08/24/2019 for Transitions Of Care   HPI TRANSITIONAL CARE MANAGEMENT TELEPHONE OUTREACH NOTE   Contact Date: 08/16/2019 Contacted By: Lynnea Ferrier, LPN   DISCHARGE INFORMATION Date of Discharge:08/15/2019 Discharge Facility: [facility] Discharge Diagnosis:Numbness  Patient is coming in for hospital follow-up after stroke for numbness and tingling.  Patient was admitted to the hospital on 08/13/2019 and discharged on 08/15/2019.  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  He has a lot of coordination issues and now he has a stutter and has numbness and some weakness on his left lower extremity and numbness on his right upper extremity.  He has some difficulty forming words at times.  These are all the things that been left since his stroke since he left the hospital.  He does feel like the numbness is improving and is starting get some tingling and burning.  He does feel like his speech is somewhat slowly improving and he is working on therapies and exercises at home and doing crossword puzzles to try to improve things.  It was recommended to talk to everybody can regularly over the phone to practice.  Relevant past medical, surgical, family and social history reviewed and updated as indicated. Interim medical history since our last visit reviewed. Allergies and medications reviewed and updated.  Review of Systems  Constitutional: Negative for chills and fever.  Eyes: Negative for visual disturbance.  Respiratory: Negative for shortness of breath and wheezing.   Cardiovascular: Negative for chest pain and leg swelling.  Musculoskeletal: Positive for gait problem. Negative for  back pain.  Skin: Negative for rash.  Neurological: Positive for weakness and numbness. Negative for dizziness and light-headedness.  All other systems reviewed and are negative.   Per HPI unless specifically indicated above   Allergies as of 08/24/2019      Reactions   Ativan [lorazepam] Other (See Comments)   Caused patient not to remember several days.   Blueberry [vaccinium Angustifolium] Swelling   Fish Oil    Shellfish Allergy    Ultram [tramadol Hcl] Hives      Medication List       Accurate as of Aug 24, 2019 12:14 PM. If you have any questions, ask your nurse or doctor.        aspirin EC 81 MG tablet Take 81 mg by mouth daily.   atorvastatin 40 MG tablet Commonly known as: LIPITOR Take 1 tablet (40 mg total) by mouth daily.   clopidogrel 75 MG tablet Commonly known as: Plavix Take 1 tablet (75 mg total) by mouth daily.   glipiZIDE 5 MG tablet Commonly known as: GLUCOTROL Take 1 tablet (5 mg total) by mouth daily before breakfast.   lisinopril 20 MG tablet Commonly known as: ZESTRIL Take 1 tablet (20 mg total) by mouth daily.   metFORMIN 1000 MG tablet Commonly known as: GLUCOPHAGE Take 1 tablet (1,000 mg total) by mouth 2 (two) times daily with a meal.   Rybelsus 3 MG Tabs Generic drug: Semaglutide Take 3 mg by mouth daily. Started by: Worthy Rancher, MD  Vitamin D (Ergocalciferol) 1.25 MG (50000 UNIT) Caps capsule Commonly known as: DRISDOL Take 1 capsule (50,000 Units total) by mouth every 7 (seven) days.        Objective:   BP (!) 157/92   Pulse 100   Temp (!) 97.1 F (36.2 C) (Temporal)   Ht 5\' 9"  (1.753 m)   Wt 213 lb 8 oz (96.8 kg)   BMI 31.53 kg/m   Wt Readings from Last 3 Encounters:  08/24/19 213 lb 8 oz (96.8 kg)  08/14/19 209 lb 7 oz (95 kg)  12/29/18 213 lb (96.6 kg)    Physical Exam Vitals and nursing note reviewed.  Constitutional:      General: He is not in acute distress.    Appearance: He is well-developed.  He is not diaphoretic.  Eyes:     General: No scleral icterus.       Right eye: No discharge.        Left eye: No discharge.     Extraocular Movements:     Right eye: Nystagmus (Small amount of nystagmus) present.     Left eye: Nystagmus present.     Conjunctiva/sclera: Conjunctivae normal.     Pupils: Pupils are equal, round, and reactive to light.  Neck:     Thyroid: No thyromegaly.  Cardiovascular:     Rate and Rhythm: Normal rate and regular rhythm.     Heart sounds: Normal heart sounds. No murmur.  Pulmonary:     Effort: Pulmonary effort is normal. No respiratory distress.     Breath sounds: Normal breath sounds. No wheezing.  Musculoskeletal:        General: Normal range of motion.     Cervical back: Neck supple.     Comments: 5 out of 5 strength in all 4 extremities  Lymphadenopathy:     Cervical: No cervical adenopathy.  Skin:    General: Skin is warm and dry.     Findings: No rash.  Neurological:     Mental Status: He is alert and oriented to person, place, and time.     Sensory: No sensory deficit.     Motor: No weakness.     Coordination: Coordination normal.  Psychiatric:        Behavior: Behavior normal.       Assessment & Plan:   Problem List Items Addressed This Visit      Endocrine   Diabetes ([place])   Relevant Medications   Semaglutide (RYBELSUS) 3 MG TABS    Other Visit Diagnoses    Status post CVA    -  Primary   Relevant Orders   Ambulatory referral to Physical Therapy   Ambulatory referral to Neurology   Balance disorder       Relevant Orders   Ambulatory referral to Physical Therapy   Ambulatory referral to Neurology   Coordination impairment       Relevant Orders   Ambulatory referral to Physical Therapy   Ambulatory referral to Neurology      Patient is leaving the hospital after lacunar infarct.  He was given Plavix and recommended to take that for a month along with the baby aspirin, he says he cannot take the Plavix because he  had too much bruising and bleeding excessively over his arms and he bled too easy so he had to stop taking it, he is still taking the baby aspirin, recommended that he double the baby aspirin and take 162 mg daily. Follow up plan: Return in  about 4 weeks (around 09/21/2019), or if symptoms worsen or fail to improve, for 4-week follow-up for diabetes and stroke with me, appointment with [facility] ASAP if possible.  Counseling provided for all of the vaccine components Orders Placed This Encounter  Procedures  . Ambulatory referral to Physical Therapy  . Ambulatory referral to Neurology    Caryl Pina, MD [facility] 08/24/2019, 12:14 PM

## 2019-08-27 ENCOUNTER — Ambulatory Visit (INDEPENDENT_AMBULATORY_CARE_PROVIDER_SITE_OTHER): Payer: Medicare HMO | Admitting: Pharmacist

## 2019-08-27 ENCOUNTER — Other Ambulatory Visit: Payer: Self-pay

## 2019-08-27 ENCOUNTER — Encounter: Payer: Self-pay | Admitting: Pharmacist

## 2019-08-27 VITALS — BP 117/69 | HR 70

## 2019-08-27 DIAGNOSIS — E1149 Type 2 diabetes mellitus with other diabetic neurological complication: Secondary | ICD-10-CM | POA: Diagnosis not present

## 2019-08-27 MED ORDER — RYBELSUS 7 MG PO TABS: 7.0000 mg | ORAL_TABLET | Freq: Every day | ORAL | 3 refills | Status: DC

## 2019-08-27 NOTE — Progress Notes (Signed)
   [place] -- DIABETES  08/27/2019 Name: Philip [name] MRN: [id number] DOB: [birth year]  Referred by: Dettinger, Fransisca Kaufmann, MD Reason for referral : Diabetes  S:  24 yOM presents for diabetes evaluation, education, and management.  He is recently s/p left thalamic infarct (08/13/19) with likely extension involving the internal capsule, posterior limb.  He uses a cane to assist with walking.  Patient was referred and last seen by Primary Care Provider on 08/24/19. Insurance coverage/medication affordability: [employer]  Patient reports adherence with medications.    Current diabetes medications include metformin only (pt has not started Rybelsus)  Current hypertension medications include: lisinopril  Current hyperlipidemia medications include: LDL 121  Patient denies hypoglycemic events.   Patient reported dietary habits: Eats 2-3 meals/day Breakfast:   wheat toast (2 slices)  oatmeal   Eggs/omlet  raisin bran Lunch: burgers, hot dogs Dinner:  brussel sprouts, [place] hot dogs  [place] TV dinners   mixed greens/black eyed peas  [place] fries  Snacks: pepper jack cheese, nutrigrain strawberry bar  Drinks: coffee, water  Patient-reported exercise habits: n/a, s/p recent stroke  O:   Lab Results  Component Value Date   HGBA1C 10.3 (H) 08/13/2019    Vitals:   08/27/19 1336  BP: 117/69  Pulse: 70   Lipid Panel     Component Value Date/Time   CHOL 234 (H) 08/14/2019 0927   CHOL 165 10/23/2018 0839   TRIG 370 (H) 08/14/2019 0927   TRIG 232 (H) 05/08/2013 1200   HDL 39 (L) 08/14/2019 0927   HDL 38 (L) 10/23/2018 0839   HDL 52 05/08/2013 1200   CHOLHDL 6.0 08/14/2019 0927   VLDL 74 (H) 08/14/2019 0927   LDLCALC 121 (H) 08/14/2019 0927   LDLCALC 89 10/23/2018 0839   LDLCALC 200 (H) 05/08/2013 1200   Home fasting blood sugars:  5/2 208 5/3 235 5/4 -- 5/5 178 5/6 228 5/7 -- 5/8 -- 5/9 188  2 hour post-meal/random blood sugars:  n/a  A/P:  Diabetes T2DM currently uncontrolled. Patient has not started new medication (Rybelsus) due to inability to research medication over the weekend. Control is suboptimal due to dietary/lifestyle.  -Instructed patient to START PO GLP-1 Rybelsus (generic name: semglutide) 3mg  by mouth daily.  -patient was given 30-day sample pack at last PCP visit on 08/24/19  -new RX called in for subsequent Rybelsus RX for patient to increase to 7mg  daily (therapuetic dose)-[facility] in [place], [place] per patient request  -reviewed adverse effects and discussed medication in-depth  -CONTINUE metformin 1g BID   -DISCONTINUE glipizide per PCP  -Extensively discussed pathophysiology of diabetes, recommended lifestyle interventions, dietary effects on blood sugar control  -Patient counseled on DAPT recommended by neuro for s/p left infarct for 30-days, however patient has refused to take this medication due to bruising and bleeding.  He is taking Aspirin 162mg  daily.  Discussed benefits vs risk  -Counseled on s/sx of and management of hypoglycemia  -Next A1C anticipated 3 months.   Written patient instructions provided.  Total time in face to face counseling 35 minutes.   Follow up PCP Clinic Visit 3 months    Regina Eck, PharmD, BCPS Clinical Pharmacist, [place]  II Phone [phone]

## 2019-08-29 ENCOUNTER — Ambulatory Visit: Payer: Medicare HMO | Attending: Family Medicine | Admitting: Physical Therapy

## 2019-08-29 ENCOUNTER — Other Ambulatory Visit: Payer: Self-pay

## 2019-08-29 ENCOUNTER — Encounter: Payer: Self-pay | Admitting: Physical Therapy

## 2019-08-29 DIAGNOSIS — R2681 Unsteadiness on feet: Secondary | ICD-10-CM | POA: Insufficient documentation

## 2019-08-29 DIAGNOSIS — M6281 Muscle weakness (generalized): Secondary | ICD-10-CM | POA: Insufficient documentation

## 2019-08-29 NOTE — Therapy (Signed)
[facility] [place], [place], [address] Phone: [phone]   Fax:  [phone]  Physical Therapy Evaluation  Patient Details  Name: Philip [name] MRN: [id number] Date of Birth: [birth year] Referring Provider (PT): Caryl Pina, MD   Encounter Date: 08/29/2019  PT End of Session - 08/29/19 2034    Visit Number  1    Number of Visits  4    Date for PT Re-Evaluation  10/03/19    Authorization Type  [employer] Medicare; Progress note every 10th visit    PT Start Time  1300    PT Stop Time  1346    PT Time Calculation (min)  46 min    Activity Tolerance  Patient tolerated treatment well    Behavior During Therapy  [facility] for tasks assessed/performed       Past Medical History:  Diagnosis Date  . Chest pain   . Diabetes mellitus without complication ([place])   . Hyperlipidemia   . Hypertension   . PTSD (post-traumatic stress disorder)     Past Surgical History:  Procedure Laterality Date  . [place]   hernitated and ruptured discs  . LUMBAR LAMINECTOMY/DECOMPRESSION MICRODISCECTOMY Right 08/01/2014   Procedure: Right L/4-5 Extraforaminal Diskectomy;  Surgeon: Eustace Moore, MD;  Location: [place] [facility];  Service: Neurosurgery;  Laterality: Right;  Right L/4-5 Extraforaminal Diskectomy  . TONSILLECTOMY    . TONSILLECTOMY      There were no vitals filed for this visit.   Subjective Assessment - 08/29/19 2028    Subjective  COVID-19 screening performed upon arrival. Patient arrives to physical therapy with reports of right arm numbness and weakness secondary to a stroke on 08/13/2019. Patient reports difficulties with fine motor task such as buttons and zippers. Patient noted with decreased coordination and balance but has been utilizing a cane since a previous lumbar surgery. Patient denies pain. Patient is consistent with exercising and has been challenging his exercise routine to improve strength. Patient's goals are  to improve movement, improve strength, and return to PLOF.    Pertinent History  Lacunar infarct 08/13/2019, HTN, DM    Limitations  Standing;Walking;House hold activities;Lifting    Diagnostic tests  MRI    Patient Stated Goals  return to normal    Currently in Pain?  No/denies         [facility] PT Assessment - 08/29/19 0001      Assessment   Medical Diagnosis  Status post CVA, Balance disorder, Coordination impairment    Referring Provider (PT)  Caryl Pina, MD    Onset Date/Surgical Date  08/13/19    Hand Dominance  Right    Prior Therapy  no      Precautions   Precautions  None      Restrictions   Weight Bearing Restrictions  No      Balance Screen   Has the patient fallen in the past 6 months  No    Has the patient had a decrease in activity level because of a fear of falling?   No    Is the patient reluctant to leave their home because of a fear of falling?   No      Home Environment   Living Environment  Private residence    Living Arrangements  Alone    Type of [place] to enter    Entrance Stairs-Number of Steps  13 and 9  Entrance Stairs-Rails  Left      Prior Function   Level of Independence  Independent      Sensation   Light Touch  Impaired by gross assessment   right C6-C7 dermatome diminished     ROM / Strength   AROM / PROM / Strength  AROM;Strength      AROM   Overall AROM   Within functional limits for tasks performed    Overall AROM Comments  bilateral UE AROM WFL      Strength   Strength Assessment Site  Hand;Shoulder    Right/Left Shoulder  Right;Left    Right Shoulder Flexion  4+/5    Right Shoulder ABduction  4+/5    Right Shoulder External Rotation  5/5    Left Shoulder Flexion  5/5    Left Shoulder ABduction  5/5    Left Shoulder Internal Rotation  5/5    Left Shoulder External Rotation  5/5    Right/Left hand  Right;Left    Right Hand Grip (lbs)  80    Left Hand Grip (lbs)  75      Transfers    Five time sit to stand comments   13 seconds no UE support      Ambulation/Gait   Assistive device  Straight cane    Gait Pattern  Step-through pattern;Decreased stance time - right;Decreased stride length;Decreased step length - left      Standardized Balance Assessment   Standardized Balance Assessment  Berg Balance Test      Berg Balance Test   Sit to Stand  Able to stand without using hands and stabilize independently    Standing Unsupported  Able to stand safely 2 minutes    Sitting with Back Unsupported but Feet Supported on Floor or Stool  Able to sit safely and securely 2 minutes    Stand to Sit  Sits safely with minimal use of hands    Transfers  Able to transfer safely, minor use of hands    Standing Unsupported with Eyes Closed  Able to stand 10 seconds safely    Standing Unsupported with Feet Together  Able to place feet together independently and stand 1 minute safely    From Standing, Reach Forward with Outstretched Arm  Can reach confidently >25 cm (10")    From Standing Position, Pick up Object from Floor  Able to pick up shoe, needs supervision    From Standing Position, Turn to Look Behind Over each Shoulder  Looks behind from both sides and weight shifts well    Turn 360 Degrees  Able to turn 360 degrees safely in 4 seconds or less    Standing Unsupported, Alternately Place Feet on Step/Stool  Able to stand independently and safely and complete 8 steps in 20 seconds    Standing Unsupported, One Foot in Front  Able to place foot tandem independently and hold 30 seconds    Standing on One Leg  Able to lift leg independently and hold > 10 seconds    Total Score  55                  Objective measurements completed on examination: See above findings.                   PT Long Term Goals - 08/29/19 2037      PT LONG TERM GOAL #1   Title  Patient will be independent with HEP    Time  4  Period  Weeks    Status  New      PT LONG TERM GOAL  #2   Title  Patient will demonstrate 85+ lb of right grip strength to improve right hand tasks.    Time  4    Period  Weeks    Status  New      PT LONG TERM GOAL #3   Title  Patient will report ability to perform all ADLs and fine motor tasks without dropping objects from right hand    Time  4    Period  Weeks    Status  New             Plan - 08/29/19 2035    Clinical Impression Statement  Patient is a 69 year old right handed male who presents to physical therapy with right hand numbness secondary to a lacunar infarct on 10/13/2019. Patient with diminished right forearm in C6-C7 dermatome. Berg Balance Scale score of 55/56. Patient ambulates with decreased right stance time, decreased left step length and with wooden SPC in left UE. Patient and PT discussed HEP and plan for physical therapy. Patient reported understanding. Patient would benefit from skilled physical therapy to address deficits and address patient's goals.    Personal Factors and Comorbidities  Comorbidity 3+    Comorbidities  Lacunar infarct CVA 08/13/2019, HTN, DM    Examination-Activity Limitations  Carry    Stability/Clinical Decision Making  Stable/Uncomplicated    Clinical Decision Making  Low    Rehab Potential  Excellent    PT Frequency  1x / week    PT Duration  4 weeks    PT Treatment/Interventions  ADLs/Self Care Home Management;Gait training;Stair training;Functional mobility training;Therapeutic activities;Therapeutic exercise;Balance training;Neuromuscular re-education;Manual techniques;Passive range of motion;Patient/family education    PT Next Visit Plan  nustep or bike, UBE, gripping for right hand/arm, fine motor tasks, advanced balance activities.    Consulted and Agree with Plan of Care  Patient       Patient will benefit from skilled therapeutic intervention in order to improve the following deficits and impairments:  Impaired sensation, Decreased activity tolerance, Difficulty walking,  Impaired UE functional use  Visit Diagnosis: Muscle weakness (generalized) - Plan: PT plan of care cert/re-cert  Unsteadiness on feet - Plan: PT plan of care cert/re-cert     Problem List Patient Active Problem List   Diagnosis Date Noted  . CVA (cerebral vascular accident) ([place]) 08/14/2019  . Numbness 08/13/2019  . Ataxia 08/13/2019  . S/P lumbar microdiscectomy 08/01/2014  . PTSD (post-traumatic stress disorder) 07/23/2014  . Hyperlipidemia   . Vitamin D deficiency 08/20/2013  . Hypercalcemia 06/19/2013  . Diabetes ([place]) 10/30/2012  . Non compliance with medical treatment 10/30/2012  . Panic disorder 10/30/2012  . Hypertension 09/24/2010    Gabriela Eves, PT, DPT 08/29/2019, 9:08 PM  [facility] [address] [place], [place], [address] Phone: [phone]   Fax:  [phone]  Name: Philip [name] MRN: [id number] Date of Birth: Feb 01, 1951

## 2019-08-30 ENCOUNTER — Ambulatory Visit: Payer: Medicare HMO | Admitting: Physical Therapy

## 2019-09-05 ENCOUNTER — Ambulatory Visit: Payer: Medicare HMO | Admitting: Physical Therapy

## 2019-09-05 ENCOUNTER — Other Ambulatory Visit: Payer: Self-pay

## 2019-09-05 ENCOUNTER — Encounter: Payer: Self-pay | Admitting: Physical Therapy

## 2019-09-05 DIAGNOSIS — M6281 Muscle weakness (generalized): Secondary | ICD-10-CM

## 2019-09-05 DIAGNOSIS — R2681 Unsteadiness on feet: Secondary | ICD-10-CM | POA: Diagnosis not present

## 2019-09-05 NOTE — Therapy (Signed)
[facility] [place], [place], [address] Phone: [phone]   Fax:  [phone]  Physical Therapy Treatment  Patient Details  Name: Philip [name] MRN: [id number] Date of Birth: January 14, 1951 Referring Provider (PT): Caryl Pina, MD   Encounter Date: 09/05/2019  PT End of Session - 09/05/19 1211    Visit Number  2    Number of Visits  4    Date for PT Re-Evaluation  10/03/19    Authorization Type  [employer] Medicare; Progress note every 10th visit    PT Start Time  1116    PT Stop Time  1200    PT Time Calculation (min)  44 min    Activity Tolerance  Patient tolerated treatment well    Behavior During Therapy  [facility] for tasks assessed/performed       Past Medical History:  Diagnosis Date  . Chest pain   . Diabetes mellitus without complication ([place])   . Hyperlipidemia   . Hypertension   . PTSD (post-traumatic stress disorder)     Past Surgical History:  Procedure Laterality Date  . [place]   hernitated and ruptured discs  . LUMBAR LAMINECTOMY/DECOMPRESSION MICRODISCECTOMY Right 08/01/2014   Procedure: Right L/4-5 Extraforaminal Diskectomy;  Surgeon: Eustace Moore, MD;  Location: [place] [facility];  Service: Neurosurgery;  Laterality: Right;  Right L/4-5 Extraforaminal Diskectomy  . TONSILLECTOMY    . TONSILLECTOMY      There were no vitals filed for this visit.  Subjective Assessment - 09/05/19 1117    Subjective  COVID 19 screening performed on patient upon arrival. Patient reports that his R hand is numb and has trouble with holding any weight object.    Pertinent History  Lacunar infarct 08/13/2019, HTN, DM    Limitations  Standing;Walking;House hold activities;Lifting    Diagnostic tests  MRI    Patient Stated Goals  return to normal    Currently in Pain?  No/denies         [facility] PT Assessment - 09/05/19 0001      Assessment   Medical Diagnosis  Status post CVA, Balance disorder, Coordination  impairment    Referring Provider (PT)  Caryl Pina, MD    Onset Date/Surgical Date  08/13/19    Hand Dominance  Right    Prior Therapy  no      Precautions   Precautions  None      Restrictions   Weight Bearing Restrictions  No                    [facility] Adult PT Treatment/Exercise - 09/05/19 0001      Exercises   Exercises  Hand;Shoulder      Shoulder Exercises: Seated   Protraction  Strengthening;Right;20 reps;Theraband    Theraband Level (Shoulder Protraction)  Level 3 (Green)    Protraction Limitations  with different grips    Diagonals  Strengthening;Right;20 reps;Theraband    Theraband Level (Shoulder Diagonals)  Level 3 (Green)    Diagonals Limitations  different grips    Other Seated Exercises  R upper cut with different grips green theraband x20 reps      Hand Exercises   Opposition  AROM;Right    Opposition Limitations  individual with foam square    Hand Gripper with Large Beads  Yellow putty with marbles    Digiticizer  Red digitizer power grip, individual finger grip     Fine Motor Coordination  Thumb opposition  Other Hand Exercises  Yellow putty gripping                  PT Long Term Goals - 08/29/19 2037      PT LONG TERM GOAL #1   Title  Patient will be independent with HEP    Time  4    Period  Weeks    Status  New      PT LONG TERM GOAL #2   Title  Patient will demonstrate 85+ lb of right grip strength to improve right hand tasks.    Time  4    Period  Weeks    Status  New      PT LONG TERM GOAL #3   Title  Patient will report ability to perform all ADLs and fine motor tasks without dropping objects from right hand    Time  4    Period  Weeks    Status  New            Plan - 09/05/19 1216    Clinical Impression Statement  Patient presented in clinic with only reports of numbness of R hand. Patient progressed through advanced fine motor skills as well as grip strengthening with grip involvement. 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 VCs provided during treatment were to avoid looking at his hand for more sensory involvement nad all hand activities were utilized with pillowcase over his hand. No complaints during treatment.    Personal Factors and Comorbidities  Comorbidity 3+    Comorbidities  Lacunar infarct CVA 08/13/2019, HTN, DM    Examination-Activity Limitations  Carry    Stability/Clinical Decision Making  Stable/Uncomplicated    Rehab Potential  Excellent    PT Frequency  1x / week    PT Duration  4 weeks    PT Treatment/Interventions  ADLs/Self Care Home Management;Gait training;Stair training;Functional mobility training;Therapeutic activities;Therapeutic exercise;Balance training;Neuromuscular re-education;Manual techniques;Passive range of motion;Patient/family education    PT Next Visit Plan  Advanced grip and fine motor skills per patient.    Consulted and Agree with Plan of Care  Patient       Patient will benefit from skilled therapeutic intervention in order to improve the following deficits and impairments:  Impaired sensation, Decreased activity tolerance, Difficulty walking, Impaired UE functional use  Visit Diagnosis: Muscle weakness (generalized)  Unsteadiness on feet     Problem List Patient Active Problem List   Diagnosis Date Noted  . CVA (cerebral vascular accident) ([place]) 08/14/2019  . Numbness 08/13/2019  . Ataxia 08/13/2019  . S/P lumbar microdiscectomy 08/01/2014  . PTSD (post-traumatic stress disorder) 07/23/2014  . Hyperlipidemia   . Vitamin D deficiency 08/20/2013  . Hypercalcemia 06/19/2013  . Diabetes ([place]) 10/30/2012  . Non compliance with medical treatment 10/30/2012  . Panic disorder 10/30/2012  . Hypertension 09/24/2010    Standley Brooking, PTA 09/05/2019, 12:23 PM  [facility] [address] [place], [place],  [address] Phone: [phone]   Fax:  [phone]  Name: Philip [name] MRN: [id number] Date of Birth: 1951/01/20

## 2019-09-12 ENCOUNTER — Encounter: Payer: Self-pay | Admitting: Physical Therapy

## 2019-09-12 ENCOUNTER — Other Ambulatory Visit: Payer: Self-pay

## 2019-09-12 ENCOUNTER — Ambulatory Visit: Payer: Medicare HMO | Admitting: Physical Therapy

## 2019-09-12 DIAGNOSIS — R2681 Unsteadiness on feet: Secondary | ICD-10-CM | POA: Diagnosis not present

## 2019-09-12 DIAGNOSIS — M6281 Muscle weakness (generalized): Secondary | ICD-10-CM

## 2019-09-12 NOTE — Therapy (Signed)
[facility] [place], [place], [address] Phone: [phone]   Fax:  [phone]  Physical Therapy Treatment  Patient Details  Name: Philip [name] MRN: [id number] Date of Birth: 1951-04-11 Referring Provider (PT): Caryl Pina, MD   Encounter Date: 09/12/2019  PT End of Session - 09/12/19 1302    Visit Number  3    Number of Visits  4    Date for PT Re-Evaluation  10/03/19    Authorization Type  [employer]; Progress note every 10th visit    PT Start Time  1302    PT Stop Time  1345    PT Time Calculation (min)  43 min    Activity Tolerance  Patient tolerated treatment well    Behavior During Therapy  [facility] for tasks assessed/performed       Past Medical History:  Diagnosis Date  . Chest pain   . Diabetes mellitus without complication ([place])   . Hyperlipidemia   . Hypertension   . PTSD (post-traumatic stress disorder)     Past Surgical History:  Procedure Laterality Date  . [place]   hernitated and ruptured discs  . LUMBAR LAMINECTOMY/DECOMPRESSION MICRODISCECTOMY Right 08/01/2014   Procedure: Right L/4-5 Extraforaminal Diskectomy;  Surgeon: Eustace Moore, MD;  Location: [facility];  Service: Neurosurgery;  Laterality: Right;  Right L/4-5 Extraforaminal Diskectomy  . TONSILLECTOMY    . TONSILLECTOMY      There were no vitals filed for this visit.  Subjective Assessment - 09/12/19 1301    Subjective  COVID 19 screening performed on patient upon arrival. Patient reports that numbness is present throughout whole R hand.    Pertinent History  Lacunar infarct 08/13/2019, HTN, DM    Limitations  Standing;Walking;House hold activities;Lifting    Diagnostic tests  MRI    Patient Stated Goals  return to normal    Currently in Pain?  No/denies         [facility] PT Assessment - 09/12/19 0001      Assessment   Medical Diagnosis  Status post CVA, Balance disorder, Coordination impairment    Referring  Provider (PT)  Caryl Pina, MD    Onset Date/Surgical Date  08/13/19    Hand Dominance  Right    Prior Therapy  no      Precautions   Precautions  None      Restrictions   Weight Bearing Restrictions  No                    [facility] Adult PT Treatment/Exercise - 09/12/19 0001      Standardized Balance Assessment   Standardized Balance Assessment  Berg Balance Test      Berg Balance Test   Sit to Stand  Able to stand without using hands and stabilize independently    Standing Unsupported  Able to stand safely 2 minutes    Sitting with Back Unsupported but Feet Supported on Floor or Stool  Able to sit safely and securely 2 minutes    Stand to Sit  Sits safely with minimal use of hands    Transfers  Able to transfer safely, minor use of hands    Standing Unsupported with Eyes Closed  Able to stand 10 seconds safely    Standing Ubsupported with Feet Together  Able to place feet together independently and stand 1 minute safely    From Standing, Reach Forward with Outstretched Arm  Can reach confidently >25 cm (10")  From Standing Position, Pick up Object from [place] to pick up shoe safely and easily    From Standing Position, Turn to Look Behind Over each Shoulder  Looks behind from both sides and weight shifts well    Turn 360 Degrees  Able to turn 360 degrees safely in 4 seconds or less    Standing Unsupported, Alternately Place Feet on Step/Stool  Able to stand independently and safely and complete 8 steps in 20 seconds    Standing Unsupported, One Foot in [place] to place foot tandem independently and hold 30 seconds    Standing on One Leg  Able to lift leg independently and hold > 10 seconds    Total Score  56          Balance Exercises - 09/12/19 1437      Balance Exercises: Standing   Standing Eyes Opened  Narrow base of support (BOS);Wide (BOA);Foam/compliant surface;Cognitive challenge   for various functional and recreational strengthening for  UE   Tandem Stance  Eyes open;Foam/compliant surface;Intermittent upper extremity support;Cognitive challenge   in various positions for function and strengthening for UE   Other Standing Exercises  All standing activiities completed with UE activity such as rows, punches, forearm and shoulder strengthening with and without resistance             PT Long Term Goals - 08/29/19 2037      PT LONG TERM GOAL #1   Title  Patient will be independent with HEP    Time  4    Period  Weeks    Status  New      PT LONG TERM GOAL #2   Title  Patient will demonstrate 85+ lb of right grip strength to improve right hand tasks.    Time  4    Period  Weeks    Status  New      PT LONG TERM GOAL #3   Title  Patient will report ability to perform all ADLs and fine motor tasks without dropping objects from right hand    Time  4    Period  Weeks    Status  New            Plan - 09/12/19 1440    Clinical Impression Statement  Patient presented in clinic requesting more balance training today. Patient states that he feels "off" and doesn't trust balance and doesn't trust himself. Patient understands that confidence is also a big factor after a huge medical event. Patient progressed through more recreational training with balance training on airex beam and pad. Intermittant UE resistance utilized as well due to patient reporting lack of strength after not working out due to [employer] and CVA. Patient is eager to return to activites such as boxing/martial arts, rock climbing, [place] sword training, shooting and Customer service manager as well. All balance activities were based off of activities from patient's pasttimes with NBOS or WBOS on unever surface.    Personal Factors and Comorbidities  Comorbidity 3+    Comorbidities  Lacunar infarct CVA 08/13/2019, HTN, DM    Examination-Activity Limitations  Carry    Stability/Clinical Decision Making  Stable/Uncomplicated    Rehab Potential  Excellent    PT  Frequency  1x / week    PT Duration  4 weeks    PT Treatment/Interventions  ADLs/Self Care Home Management;Gait training;Stair training;Functional mobility training;Therapeutic activities;Therapeutic exercise;Balance training;Neuromuscular re-education;Manual techniques;Passive range of motion;Patient/family education    PT Next Visit  Plan  Plan for machine UE training for safety and high level recreational balance activities.    Consulted and Agree with Plan of Care  Patient       Patient will benefit from skilled therapeutic intervention in order to improve the following deficits and impairments:  Impaired sensation, Decreased activity tolerance, Difficulty walking, Impaired UE functional use  Visit Diagnosis: Muscle weakness (generalized)  Unsteadiness on feet     Problem List Patient Active Problem List   Diagnosis Date Noted  . CVA (cerebral vascular accident) ([place]) 08/14/2019  . Numbness 08/13/2019  . Ataxia 08/13/2019  . S/P lumbar microdiscectomy 08/01/2014  . PTSD (post-traumatic stress disorder) 07/23/2014  . Hyperlipidemia   . Vitamin D deficiency 08/20/2013  . Hypercalcemia 06/19/2013  . Diabetes ([place]) 10/30/2012  . Non compliance with medical treatment 10/30/2012  . Panic disorder 10/30/2012  . Hypertension 09/24/2010    Standley Brooking, PTA 09/12/2019, 2:52 PM  [facility] [address] [place], [place], [address] Phone: [phone]   Fax:  [phone]  Name: Philip [name] MRN: [id number] Date of Birth: [birth year]

## 2019-09-19 ENCOUNTER — Ambulatory Visit: Payer: Medicare HMO | Attending: Family Medicine | Admitting: Physical Therapy

## 2019-09-19 ENCOUNTER — Encounter: Payer: Self-pay | Admitting: Physical Therapy

## 2019-09-19 ENCOUNTER — Other Ambulatory Visit: Payer: Self-pay

## 2019-09-19 DIAGNOSIS — M6281 Muscle weakness (generalized): Secondary | ICD-10-CM

## 2019-09-19 DIAGNOSIS — R2681 Unsteadiness on feet: Secondary | ICD-10-CM | POA: Diagnosis not present

## 2019-09-19 NOTE — Therapy (Addendum)
[facility] [place], [place], [address] Phone: [phone]   Fax:  [phone]  Physical Therapy Treatment PHYSICAL THERAPY DISCHARGE SUMMARY  Visits from Start of Care: 4  Current functional level related to goals / functional outcomes: See below   Remaining deficits: See goals   Education / Equipment: HEP Plan: Patient agrees to discharge.  Patient goals were partially met. Patient is being discharged due to being pleased with the current functional level.  ?????      Patient Details  Name: Philip [name] MRN: [id number] Date of Birth: 10/25/1950 Referring Provider (PT): Caryl Pina, MD   Encounter Date: 09/19/2019  PT End of Session - 09/19/19 1308    Visit Number  4    Number of Visits  4    Date for PT Re-Evaluation  10/03/19    Authorization Type  [employer]; Progress note every 10th visit    PT Start Time  1300    PT Stop Time  1345    PT Time Calculation (min)  45 min    Activity Tolerance  Patient tolerated treatment well    Behavior During Therapy  [facility] for tasks assessed/performed       Past Medical History:  Diagnosis Date  . Chest pain   . Diabetes mellitus without complication ([place])   . Hyperlipidemia   . Hypertension   . PTSD (post-traumatic stress disorder)     Past Surgical History:  Procedure Laterality Date  . [place]   hernitated and ruptured discs  . LUMBAR LAMINECTOMY/DECOMPRESSION MICRODISCECTOMY Right 08/01/2014   Procedure: Right L/4-5 Extraforaminal Diskectomy;  Surgeon: Eustace Moore, MD;  Location: [place] [facility];  Service: Neurosurgery;  Laterality: Right;  Right L/4-5 Extraforaminal Diskectomy  . TONSILLECTOMY    . TONSILLECTOMY      There were no vitals filed for this visit.  Subjective Assessment - 09/19/19 1306    Subjective  COVID 19 screening performed on patient upon arrival. Reports over doing it today and having more back pain. 8/10     Pertinent History  Lacunar infarct 08/13/2019, HTN, DM    Limitations  Standing;Walking;House hold activities;Lifting    Diagnostic tests  MRI    Patient Stated Goals  return to normal    Currently in Pain?  Yes    Pain Score  8     Pain Location  Back         [facility] PT Assessment - 09/19/19 0001      Assessment   Medical Diagnosis  Status post CVA, Balance disorder, Coordination impairment    Referring Provider (PT)  Caryl Pina, MD    Onset Date/Surgical Date  08/13/19    Hand Dominance  Right    Prior Therapy  no      Precautions   Precautions  None      Restrictions   Weight Bearing Restrictions  No      Strength   Right Hand Grip (lbs)  82    Left Hand Grip (lbs)  80                    [facility] Adult PT Treatment/Exercise - 09/19/19 0001      Self-Care   Self-Care  Other Self-Care Comments    Other Self-Care Comments   Review of all gym machines, ways to progress, when to stop, how to set up for various exercise machines and technique.      Shoulder Exercises: ROM/Strengthening  UBE (Upper Arm Bike)  90 RPM x10 mins (5 fwd, 5 bwd)          Balance Exercises - 09/19/19 1754      Balance Exercises: Standing   Standing Eyes Opened  Foam/compliant surface;Narrow base of support (BOS);Other (comment)   ball toss x3 mins   Standing Eyes Closed  Narrow base of support (BOS);Foam/compliant surface;5 reps;Other (comment)    SLS with Vectors  Solid surface;5 reps   with colored pod touches in square bilaterally   Balance Beam  lateral stepping with red theraband punches x3 mins; tandem walking forward and backward  with 2# weight distribution from L to R x3 mins              PT Long Term Goals - 09/19/19 1806      PT LONG TERM GOAL #1   Title  Patient will be independent with HEP    Time  4    Period  Weeks    Status  Achieved      PT LONG TERM GOAL #2   Title  Patient will demonstrate 85+ lb of right grip strength to improve right hand  tasks.    Time  4    Period  Weeks    Status  Partially Met      PT LONG TERM GOAL #3   Title  Patient will report ability to perform all ADLs and fine motor tasks without dropping objects from right hand    Time  4    Period  Weeks    Status  Partially Met            Plan - 09/19/19 1807    Clinical Impression Statement  Patient responded well to therapy session despite low back pain reported at start of session. Patient guided through balance activities to which supervision was required. Patient able to perform all activites with minimal loss of balance and use of balance strategies to maintain balance. Patient and PT reviewed exercise machines as patient will be [facility] to the [facility] for his exercise routine. Patient reviewed proper technique, how to progress/regress exercises and how to modify as needed. Patient educated on how recovery s/p CVA will take time and slow progression of exercises and strengthening is important. Patient responded he understands it could take up to 6 months for recovery but states he does not have the time. Patient's goals were partially met.    Personal Factors and Comorbidities  Comorbidity 3+    Comorbidities  Lacunar infarct CVA 08/13/2019, HTN, DM    Examination-Activity Limitations  Carry    Stability/Clinical Decision Making  Stable/Uncomplicated    Clinical Decision Making  Low    Rehab Potential  Excellent    PT Frequency  1x / week    PT Duration  4 weeks    PT Treatment/Interventions  ADLs/Self Care Home Management;Gait training;Stair training;Functional mobility training;Therapeutic activities;Therapeutic exercise;Balance training;Neuromuscular re-education;Manual techniques;Passive range of motion;Patient/family education    PT Next Visit Plan  DC    Consulted and Agree with Plan of Care  Patient       Patient will benefit from skilled therapeutic intervention in order to improve the following deficits and impairments:  Impaired sensation,  Decreased activity tolerance, Difficulty walking, Impaired UE functional use  Visit Diagnosis: Muscle weakness (generalized)  Unsteadiness on feet     Problem List Patient Active Problem List   Diagnosis Date Noted  . CVA (cerebral vascular accident) ([place]) 08/14/2019  . Numbness 08/13/2019  .  Ataxia 08/13/2019  . S/P lumbar microdiscectomy 08/01/2014  . PTSD (post-traumatic stress disorder) 07/23/2014  . Hyperlipidemia   . Vitamin D deficiency 08/20/2013  . Hypercalcemia 06/19/2013  . Diabetes ([place]) 10/30/2012  . Non compliance with medical treatment 10/30/2012  . Panic disorder 10/30/2012  . Hypertension 09/24/2010    Gabriela Eves, PT, DPT 09/19/2019, 6:19 PM  [facility] [address] [place], [place], [address] Phone: [phone]   Fax:  [phone]  Name: Philip [name] MRN: [id number] Date of Birth: [birth year]

## 2019-09-20 DIAGNOSIS — L603 Nail dystrophy: Secondary | ICD-10-CM | POA: Diagnosis not present

## 2019-09-21 ENCOUNTER — Telehealth: Payer: Self-pay | Admitting: Pharmacist

## 2019-09-21 ENCOUNTER — Encounter: Payer: Self-pay | Admitting: Family Medicine

## 2019-09-21 ENCOUNTER — Other Ambulatory Visit: Payer: Self-pay

## 2019-09-21 ENCOUNTER — Ambulatory Visit (INDEPENDENT_AMBULATORY_CARE_PROVIDER_SITE_OTHER): Payer: Medicare HMO | Admitting: Family Medicine

## 2019-09-21 VITALS — BP 153/77 | HR 98 | Temp 98.2°F | Ht 69.0 in | Wt 206.5 lb

## 2019-09-21 DIAGNOSIS — I10 Essential (primary) hypertension: Secondary | ICD-10-CM

## 2019-09-21 DIAGNOSIS — Z8673 Personal history of transient ischemic attack (TIA), and cerebral infarction without residual deficits: Secondary | ICD-10-CM

## 2019-09-21 DIAGNOSIS — E782 Mixed hyperlipidemia: Secondary | ICD-10-CM | POA: Diagnosis not present

## 2019-09-21 DIAGNOSIS — E1149 Type 2 diabetes mellitus with other diabetic neurological complication: Secondary | ICD-10-CM | POA: Diagnosis not present

## 2019-09-21 LAB — BAYER DCA HB A1C WAIVED: HB A1C (BAYER DCA - WAIVED): 8.9 % — ABNORMAL HIGH (ref <7.0)

## 2019-09-21 NOTE — Telephone Encounter (Signed)
Rybelsus 7mg  daily copay at [facility]: -$47 for 30 days -$135 for 90 days

## 2019-09-21 NOTE — Progress Notes (Signed)
BP (!) 153/77   Pulse 98   Temp 98.2 F (36.8 C)   Ht 5' 9"  (1.753 m)   Wt 206 lb 8 oz (93.7 kg)   SpO2 98%   BMI 30.49 kg/m    Subjective:   Patient ID: Philip [name], male    DOB: 12-05-1950, 69 y.o.   MRN: [id number]  HPI: Philip [name] is a 69 y.o. male presenting on 09/21/2019 for Diabetes and Cerebrovascular Accident   HPI Type 2 diabetes mellitus Patient comes in today for recheck of his diabetes. Patient has been currently taking Rybelsus. Patient is currently on an ACE inhibitor/ARB. Patient has not seen an ophthalmologist this year. Patient denies any issues with their feet. The symptom started onset as an adult hypertension and hyperlipidemia ARE RELATED TO DM   Hyperlipidemia Patient is coming in for recheck of his hyperlipidemia. The patient is currently taking atorvastatin. They deny any issues with myalgias or history of liver damage from it. They deny any focal numbness or weakness or chest pain.   Hypertension Patient is currently on lisinopril, and their blood pressure today is 153/77. Patient denies any lightheadedness or dizziness. Patient denies headaches, blurred vision, chest pains, shortness of breath, or weakness. Denies any side effects from medication and is content with current medication.    Relevant past medical, surgical, family and social history reviewed and updated as indicated. Interim medical history since our last visit reviewed. Allergies and medications reviewed and updated.  Review of Systems  Constitutional: Negative for chills and fever.  Eyes: Negative for discharge.  Respiratory: Negative for shortness of breath and wheezing.   Cardiovascular: Negative for chest pain and leg swelling.  Musculoskeletal: Negative for back pain and gait problem.  Skin: Negative for rash.  Neurological: Negative for dizziness, weakness and light-headedness.  All other systems reviewed and are negative.   Per HPI unless specifically indicated  above   Allergies as of 09/21/2019      Reactions   Ativan [lorazepam] Other (See Comments)   Caused patient not to remember several days.   Blueberry [vaccinium Angustifolium] Swelling   Fish Oil    Shellfish Allergy    Ultram [tramadol Hcl] Hives      Medication List       Accurate as of September 21, 2019  3:21 PM. If you have any questions, ask your nurse or doctor.        STOP taking these medications   clopidogrel 75 MG tablet Commonly known as: Plavix Stopped by: Fransisca Kaufmann Lakesia Dahle, MD   glipiZIDE 5 MG tablet Commonly known as: GLUCOTROL Stopped by: Fransisca Kaufmann Mylez Venable, MD     TAKE these medications   aspirin EC 81 MG tablet Take 81 mg by mouth in the morning and at bedtime.   atorvastatin 40 MG tablet Commonly known as: LIPITOR Take 1 tablet (40 mg total) by mouth daily.   lisinopril 20 MG tablet Commonly known as: ZESTRIL Take 1 tablet (20 mg total) by mouth daily.   metFORMIN 1000 MG tablet Commonly known as: GLUCOPHAGE Take 1 tablet (1,000 mg total) by mouth 2 (two) times daily with a meal.   Rybelsus 7 MG Tabs Generic drug: Semaglutide Take 7 mg by mouth daily.   Vitamin D (Ergocalciferol) 1.25 MG (50000 UNIT) Caps capsule Commonly known as: DRISDOL Take 1 capsule (50,000 Units total) by mouth every 7 (seven) days.        Objective:   BP (!) 153/77   Pulse 98  Temp 98.2 F (36.8 C)   Ht 5' 9"  (1.753 m)   Wt 206 lb 8 oz (93.7 kg)   SpO2 98%   BMI 30.49 kg/m   Wt Readings from Last 3 Encounters:  09/21/19 206 lb 8 oz (93.7 kg)  08/24/19 213 lb 8 oz (96.8 kg)  08/14/19 209 lb 7 oz (95 kg)    Physical Exam Vitals and nursing note reviewed.  Constitutional:      General: He is not in acute distress.    Appearance: He is well-developed. He is not diaphoretic.  Eyes:     General: No scleral icterus.    Conjunctiva/sclera: Conjunctivae normal.  Neck:     Thyroid: No thyromegaly.  Cardiovascular:     Rate and Rhythm: Normal rate and  regular rhythm.     Heart sounds: Normal heart sounds. No murmur heard.   Pulmonary:     Effort: Pulmonary effort is normal. No respiratory distress.     Breath sounds: Normal breath sounds. No wheezing.  Musculoskeletal:        General: Normal range of motion.     Cervical back: Neck supple.  Lymphadenopathy:     Cervical: No cervical adenopathy.  Skin:    General: Skin is warm and dry.     Findings: No rash.  Neurological:     Mental Status: He is alert and oriented to person, place, and time.     Coordination: Coordination normal.  Psychiatric:        Behavior: Behavior normal.       Assessment & Plan:   Problem List Items Addressed This Visit      Cardiovascular and Mediastinum   Hypertension   Relevant Orders   CMP14+EGFR     Endocrine   Diabetes ([place]) - Primary   Relevant Orders   Bayer DCA Hb A1c Waived   CMP14+EGFR     Other   Hyperlipidemia   Status post CVA      Patient never tried to pick up the Rybelsus, he has been taking a sample still and has 5 days left.  He was concerned about the pricing. Follow up plan: Return in about 3 months (around 12/22/2019), or if symptoms worsen or fail to improve, for Diabetes and hypertension and cholesterol.  Counseling provided for all of the vaccine components Orders Placed This Encounter  Procedures  . Bayer DCA Hb A1c Waived  . Post Santonio Speakman, MD [facility] 09/21/2019, 3:21 PM

## 2019-09-21 NOTE — Telephone Encounter (Addendum)
   Sample of Rybelsus 3mg  (take 2 tabs daily at the same time to equal 6mg  daily) given to patient #30-->LOT#[id number], EXP 3/23  Patient will pick up 7mg  daily RX from [facility]

## 2019-09-22 LAB — CMP14+EGFR
ALT: 15 IU/L (ref 0–44)
AST: 15 IU/L (ref 0–40)
Albumin/Globulin Ratio: 2.1 (ref 1.2–2.2)
Albumin: 4.6 g/dL (ref 3.8–4.8)
Alkaline Phosphatase: 89 IU/L (ref 48–121)
BUN/Creatinine Ratio: 11 (ref 10–24)
BUN: 9 mg/dL (ref 8–27)
Bilirubin Total: 0.4 mg/dL (ref 0.0–1.2)
CO2: 22 mmol/L (ref 20–29)
Calcium: 10.7 mg/dL — ABNORMAL HIGH (ref 8.6–10.2)
Chloride: 105 mmol/L (ref 96–106)
Creatinine, Ser: 0.83 mg/dL (ref 0.76–1.27)
GFR calc Af Amer: 105 mL/min/{1.73_m2} (ref >59)
GFR calc non Af Amer: 90 mL/min/{1.73_m2} (ref >59)
Globulin, Total: 2.2 g/dL (ref 1.5–4.5)
Glucose: 161 mg/dL — ABNORMAL HIGH (ref 65–99)
Potassium: 4.5 mmol/L (ref 3.5–5.2)
Sodium: 141 mmol/L (ref 134–144)
Total Protein: 6.8 g/dL (ref 6.0–8.5)

## 2019-09-26 ENCOUNTER — Ambulatory Visit: Payer: Medicare HMO | Admitting: Physical Therapy

## 2019-10-05 ENCOUNTER — Telehealth: Payer: Self-pay | Admitting: Family Medicine

## 2019-10-05 NOTE — Telephone Encounter (Signed)
°  Prescription Request  10/05/2019  What is the name of the medication or equipment? Semaglutide (RYBELSUS) 7 MG TABS/ pt wants to know if you have samples  Have you contacted your pharmacy to request a refill? (if applicable) no  Which pharmacy would you like this sent to? na   Patient notified that their request is being sent to the clinical staff for review and that they should receive a response within 2 business days.

## 2019-10-10 NOTE — Telephone Encounter (Signed)
Rybelsus samples placed up front for patient -- please call patient to inform him We only have 3 mg tablets Patient has been instructed to double up on dose (take two tablets by mouth daily=6mg  daily)  EOF#[id number] Exp 3/23

## 2019-10-11 NOTE — Telephone Encounter (Signed)
Spoke to pt and he says he was able to get his rx for the 7 mg at [facility] for $150 for 3 months so he doesn't need the samples.

## 2019-10-11 NOTE — Telephone Encounter (Signed)
No answer and couldn't leave message.

## 2019-10-15 ENCOUNTER — Ambulatory Visit: Payer: Medicare HMO | Admitting: Neurology

## 2019-11-28 ENCOUNTER — Ambulatory Visit: Payer: Medicare HMO

## 2019-12-26 ENCOUNTER — Ambulatory Visit (INDEPENDENT_AMBULATORY_CARE_PROVIDER_SITE_OTHER): Payer: Medicare HMO | Admitting: Family Medicine

## 2019-12-26 ENCOUNTER — Encounter: Payer: Self-pay | Admitting: Family Medicine

## 2019-12-26 ENCOUNTER — Other Ambulatory Visit: Payer: Self-pay

## 2019-12-26 VITALS — BP 136/79 | HR 134 | Temp 98.2°F | Ht 69.0 in | Wt 190.0 lb

## 2019-12-26 DIAGNOSIS — I10 Essential (primary) hypertension: Secondary | ICD-10-CM | POA: Diagnosis not present

## 2019-12-26 DIAGNOSIS — E1149 Type 2 diabetes mellitus with other diabetic neurological complication: Secondary | ICD-10-CM | POA: Diagnosis not present

## 2019-12-26 DIAGNOSIS — E782 Mixed hyperlipidemia: Secondary | ICD-10-CM

## 2019-12-26 DIAGNOSIS — R5383 Other fatigue: Secondary | ICD-10-CM | POA: Diagnosis not present

## 2019-12-26 DIAGNOSIS — E785 Hyperlipidemia, unspecified: Secondary | ICD-10-CM | POA: Diagnosis not present

## 2019-12-26 LAB — BAYER DCA HB A1C WAIVED: HB A1C (BAYER DCA - WAIVED): 6.8 % (ref <7.0)

## 2019-12-26 NOTE — Progress Notes (Signed)
BP 136/79   Pulse (!) 134   Temp 98.2 F (36.8 C)   Ht 5' 9"  (1.753 m)   Wt 190 lb (86.2 kg)   SpO2 99%   BMI 28.06 kg/m    Subjective:   Patient ID: Philip [name], male    DOB: Mar 15, 1951, 69 y.o.   MRN: [id number]  HPI: Philip [name] is a 69 y.o. male presenting on 12/26/2019 for Medical Management of Chronic Issues and Diabetes   HPI Type 2 diabetes mellitus Patient comes in today for recheck of his diabetes. Patient has been currently taking Rybelsus and Metformin. Patient is currently on an ACE inhibitor/ARB. Patient has seen an ophthalmologist this year. Patient denies any issues with their feet. The symptom started onset as an adult hypertension hyperlipidemia ARE RELATED TO DM   Hypertension Patient is currently on lisinopril, and their blood pressure today is 136/79. Patient denies any lightheadedness or dizziness. Patient denies headaches, blurred vision, chest pains, shortness of breath, or weakness. Denies any side effects from medication and is content with current medication.   Hyperlipidemia Patient is coming in for recheck of his hyperlipidemia. The patient is currently taking atorvastatin. They deny any issues with myalgias or history of liver damage from it. They deny any focal numbness or weakness or chest pain.   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  Over the past 3 months his A1c went from 9-6.8 so it is likely the cause, he denies any blood loss anywhere but just says he feels fatigued and less energy.  Relevant past medical, surgical, family and social history reviewed and updated as indicated. Interim medical history since our last visit reviewed. Allergies and medications reviewed and updated.  Review of Systems  Constitutional: Positive for fatigue. Negative for chills and  fever.  Eyes: Negative for visual disturbance.  Respiratory: Negative for shortness of breath and wheezing.   Cardiovascular: Negative for chest pain and leg swelling.  Musculoskeletal: Negative for back pain and gait problem.  Skin: Negative for rash.  Neurological: Negative for dizziness, weakness and light-headedness.  All other systems reviewed and are negative.   Per HPI unless specifically indicated above   Allergies as of 12/26/2019      Reactions   Ativan [lorazepam] Other (See Comments)   Caused patient not to remember several days.   Blueberry [vaccinium Angustifolium] Swelling   Fish Oil    Shellfish Allergy    Ultram [tramadol Hcl] Hives      Medication List       Accurate as of December 26, 2019  8:43 AM. If you have any questions, ask your nurse or doctor.        aspirin EC 81 MG tablet Take 81 mg by mouth in the morning and at bedtime.   atorvastatin 40 MG tablet Commonly known as: LIPITOR Take 1 tablet (40 mg total) by mouth daily.   lisinopril 20 MG tablet Commonly known as: ZESTRIL Take 1 tablet (20 mg total) by mouth daily.   metFORMIN 1000 MG tablet Commonly known as: GLUCOPHAGE Take 1 tablet (1,000 mg total) by mouth 2 (two) times daily with a meal.   Rybelsus 7 MG Tabs Generic drug: Semaglutide Take 7 mg by mouth daily.   Vitamin D (Ergocalciferol) 1.25 MG (50000 UNIT) Caps capsule Commonly known  as: DRISDOL Take 1 capsule (50,000 Units total) by mouth every 7 (seven) days.        Objective:   BP 136/79   Pulse (!) 134   Temp 98.2 F (36.8 C)   Ht 5' 9"  (1.753 m)   Wt 190 lb (86.2 kg)   SpO2 99%   BMI 28.06 kg/m   Wt Readings from Last 3 Encounters:  12/26/19 190 lb (86.2 kg)  09/21/19 206 lb 8 oz (93.7 kg)  08/24/19 213 lb 8 oz (96.8 kg)    Physical Exam Vitals and nursing note reviewed.  Constitutional:      General: He is not in acute distress.    Appearance: He is well-developed. He is not diaphoretic.  Eyes:      General: No scleral icterus.    Conjunctiva/sclera: Conjunctivae normal.  Neck:     Thyroid: No thyromegaly.  Cardiovascular:     Rate and Rhythm: Normal rate and regular rhythm.     Heart sounds: Normal heart sounds. No murmur heard.   Pulmonary:     Effort: Pulmonary effort is normal. No respiratory distress.     Breath sounds: Normal breath sounds. No wheezing.  Musculoskeletal:        General: Normal range of motion.     Cervical back: Neck supple.  Lymphadenopathy:     Cervical: No cervical adenopathy.  Skin:    General: Skin is warm and dry.     Findings: No rash.  Neurological:     Mental Status: He is alert and oriented to person, place, and time.     Coordination: Coordination normal.  Psychiatric:        Behavior: Behavior normal.     Results for orders placed or performed in visit on 12/26/19  Bayer DCA Hb A1c Waived  Result Value Ref Range   HB A1C (BAYER DCA - WAIVED) 6.8 <7.0 %    Assessment & Plan:   Problem List Items Addressed This Visit      Cardiovascular and Mediastinum   Hypertension   Relevant Orders   CMP14+EGFR     Endocrine   Diabetes ([place]) - Primary   Relevant Orders   Bayer DCA Hb A1c Waived (Completed)   CMP14+EGFR     Other   Hyperlipidemia   Relevant Orders   Lipid panel    Other Visit Diagnoses    Fatigue, unspecified type       Relevant Orders   CBC with Differential/Platelet   TSH      Patient is a little bit fatigued and it is likely because he is getting used to his blood sugars being lower but will check his thyroid and his blood counts, also we'll keep on the back burner because he is a loud snoring that he may want to get a sleep study in the future.  A1c is 6.8 which is much improved, continue with current medication and continue with diet and exercise, likely will continue to improve. Follow up plan: Return in about 3 months (around 03/26/2020), or if symptoms worsen or fail to improve, for Diabetes  recheck.  Counseling provided for all of the vaccine components Orders Placed This Encounter  Procedures  . [facility] Hb A1c Waived    Caryl Pina, MD [facility] 12/26/2019, 8:43 AM

## 2019-12-27 LAB — CMP14+EGFR
ALT: 21 IU/L (ref 0–44)
AST: 21 IU/L (ref 0–40)
Albumin/Globulin Ratio: 2 (ref 1.2–2.2)
Albumin: 4.5 g/dL (ref 3.8–4.8)
Alkaline Phosphatase: 84 IU/L (ref 48–121)
BUN/Creatinine Ratio: 9 — ABNORMAL LOW (ref 10–24)
BUN: 9 mg/dL (ref 8–27)
Bilirubin Total: 0.5 mg/dL (ref 0.0–1.2)
CO2: 21 mmol/L (ref 20–29)
Calcium: 11.9 mg/dL — ABNORMAL HIGH (ref 8.6–10.2)
Chloride: 100 mmol/L (ref 96–106)
Creatinine, Ser: 1.04 mg/dL (ref 0.76–1.27)
GFR calc Af Amer: 85 mL/min/{1.73_m2} (ref >59)
GFR calc non Af Amer: 73 mL/min/{1.73_m2} (ref >59)
Globulin, Total: 2.2 g/dL (ref 1.5–4.5)
Glucose: 211 mg/dL — ABNORMAL HIGH (ref 65–99)
Potassium: 5 mmol/L (ref 3.5–5.2)
Sodium: 137 mmol/L (ref 134–144)
Total Protein: 6.7 g/dL (ref 6.0–8.5)

## 2019-12-27 LAB — LIPID PANEL
Chol/HDL Ratio: 5 ratio (ref 0.0–5.0)
Cholesterol, Total: 194 mg/dL (ref 100–199)
HDL: 39 mg/dL — ABNORMAL LOW (ref >39)
LDL Chol Calc (NIH): 123 mg/dL — ABNORMAL HIGH (ref 0–99)
Triglycerides: 179 mg/dL — ABNORMAL HIGH (ref 0–149)
VLDL Cholesterol Cal: 32 mg/dL (ref 5–40)

## 2019-12-27 LAB — CBC WITH DIFFERENTIAL/PLATELET
Basophils Absolute: 0.1 10*3/uL (ref 0.0–0.2)
Basos: 0 %
EOS (ABSOLUTE): 0.2 10*3/uL (ref 0.0–0.4)
Eos: 2 %
Hematocrit: 43.7 % (ref 37.5–51.0)
Hemoglobin: 15.2 g/dL (ref 13.0–17.7)
Immature Grans (Abs): 0 10*3/uL (ref 0.0–0.1)
Immature Granulocytes: 0 %
Lymphocytes Absolute: 4.1 10*3/uL — ABNORMAL HIGH (ref 0.7–3.1)
Lymphs: 36 %
MCH: 31.6 pg (ref 26.6–33.0)
MCHC: 34.8 g/dL (ref 31.5–35.7)
MCV: 91 fL (ref 79–97)
Monocytes Absolute: 1 10*3/uL — ABNORMAL HIGH (ref 0.1–0.9)
Monocytes: 9 %
Neutrophils Absolute: 6 10*3/uL (ref 1.4–7.0)
Neutrophils: 53 %
Platelets: 351 10*3/uL (ref 150–450)
RBC: 4.81 x10E6/uL (ref 4.14–5.80)
RDW: 12.6 % (ref 11.6–15.4)
WBC: 11.3 10*3/uL — ABNORMAL HIGH (ref 3.4–10.8)

## 2019-12-27 LAB — TSH: TSH: 1.03 u[IU]/mL (ref 0.450–4.500)

## 2019-12-31 ENCOUNTER — Other Ambulatory Visit: Payer: Self-pay | Admitting: Family Medicine

## 2019-12-31 DIAGNOSIS — E785 Hyperlipidemia, unspecified: Secondary | ICD-10-CM

## 2019-12-31 MED ORDER — ATORVASTATIN CALCIUM 40 MG PO TABS: 40.0000 mg | ORAL_TABLET | Freq: Every day | ORAL | 1 refills | Status: DC

## 2019-12-31 NOTE — Addendum Note (Signed)
Addended by: Nigel Berthold C on: 12/31/2019 10:02 AM   Modules accepted: Orders

## 2019-12-31 NOTE — Progress Notes (Signed)
Attempted to contact - NVM

## 2019-12-31 NOTE — Progress Notes (Signed)
Sent atorvastatin for the patient.

## 2020-02-04 ENCOUNTER — Other Ambulatory Visit: Payer: Self-pay

## 2020-02-04 ENCOUNTER — Ambulatory Visit (INDEPENDENT_AMBULATORY_CARE_PROVIDER_SITE_OTHER): Payer: Medicare HMO | Admitting: Pharmacist

## 2020-02-04 DIAGNOSIS — E119 Type 2 diabetes mellitus without complications: Secondary | ICD-10-CM | POA: Diagnosis not present

## 2020-02-04 NOTE — Progress Notes (Signed)
    02/04/2020 Name: Philip [name] MRN: [id number] DOB: 05/17/1950   S:  6 yOM presents for diabetes evaluation, education, and management.  Patient was referred and last seen by Primary Care Provider on 12/26/19.  A1c has decreased to 6.8% Insurance coverage/medication affordability: [employer]  Patient reports adherence with medications. . Current diabetes medications include: metformin (GFR 73), rybelsus . Current hypertension medications include: lisinopril Goal 130/80 . Current hyperlipidemia medications include: atorvastatin   Patient denies hypoglycemic events.   Patient reported dietary habits: Eats 2 meals/day Diet varies He will eat breakfast (coffee, entree) and sensible dinners (I.e. liver/onions/green beans, lasagna does work to portion control)  Discussed meal planning options and Plate method for healthy eating . Avoid sugary drinks and desserts . Incorporate balanced protein, non starchy veggies, 1 serving of carbohydrate with each meal . Increase water intake . Increase physical activity as able  The patient is asked to make an attempt to improve diet and exercise patterns to aid in medical management of this problem.  Patient-reported exercise habits: n/a, recent stroke  O:  Lab Results  Component Value Date   HGBA1C 6.8 12/26/2019   Lipid Panel     Component Value Date/Time   CHOL 194 12/26/2019 0923   TRIG 179 (H) 12/26/2019 0923   TRIG 232 (H) 05/08/2013 1200   HDL 39 (L) 12/26/2019 0923   HDL 52 05/08/2013 1200   CHOLHDL 5.0 12/26/2019 0923   CHOLHDL 6.0 08/14/2019 0927   VLDL 74 (H) 08/14/2019 0927   LDLCALC 123 (H) 12/26/2019 0923   LDLCALC 200 (H) 05/08/2013 1200    Home fasting blood sugars: 112, 209, 111, 123, 189, 149, 193, 137, 207,118, 175, 133, 121, 98, 127, 139, 115, 148  2 hour post-meal/random blood sugars: n/a.   A/P:  Diabetes T2DM currently controlled.  Patient is adherent with medication.  FBG does vary. A1c is  controlled.  Counseled patient to take Rybelsus with water only.  This could be affecting efficacy of Rybelsus.  -Continue Rybelsus 7mg  daily (GLP-1)  Counseled patient to take with water--he has been taking every morning with orange juice  Sample IRS#[id number], exp 2/22  Will apply for patient assistance through [employer] patient assistance  -Continue Metformin  -Consider adding SGLT2 to regimen at follow up based on CV benefits and additional control  -Extensively discussed pathophysiology of diabetes, recommended lifestyle interventions, dietary effects on blood sugar control  -Counseled on s/sx of and management of hypoglycemia  -Next A1C anticipated 3-6 months    Written patient instructions provided.  Total time in face to face counseling 30 minutes.   Follow up PCP Clinic Visit ON 03/26/20.   Regina Eck, PharmD, BCPS Clinical Pharmacist, [place]  [facility] Phone [phone]

## 2020-02-19 DIAGNOSIS — H5200 Hypermetropia, unspecified eye: Secondary | ICD-10-CM | POA: Diagnosis not present

## 2020-02-19 DIAGNOSIS — Z0100 Encounter for examination of eyes and vision without abnormal findings: Secondary | ICD-10-CM | POA: Diagnosis not present

## 2020-03-26 ENCOUNTER — Encounter: Payer: Self-pay | Admitting: Family Medicine

## 2020-03-26 ENCOUNTER — Other Ambulatory Visit: Payer: Self-pay

## 2020-03-26 ENCOUNTER — Ambulatory Visit (INDEPENDENT_AMBULATORY_CARE_PROVIDER_SITE_OTHER): Payer: Medicare HMO | Admitting: Family Medicine

## 2020-03-26 VITALS — BP 140/87 | HR 114 | Ht 69.0 in | Wt 195.0 lb

## 2020-03-26 DIAGNOSIS — I10 Essential (primary) hypertension: Secondary | ICD-10-CM

## 2020-03-26 DIAGNOSIS — E785 Hyperlipidemia, unspecified: Secondary | ICD-10-CM | POA: Diagnosis not present

## 2020-03-26 DIAGNOSIS — E119 Type 2 diabetes mellitus without complications: Secondary | ICD-10-CM | POA: Diagnosis not present

## 2020-03-26 LAB — BAYER DCA HB A1C WAIVED: HB A1C (BAYER DCA - WAIVED): 6.6 % (ref <7.0)

## 2020-03-26 MED ORDER — ATORVASTATIN CALCIUM 40 MG PO TABS: 40.0000 mg | ORAL_TABLET | Freq: Every day | ORAL | 3 refills | Status: DC

## 2020-03-26 MED ORDER — LISINOPRIL 20 MG PO TABS: 20.0000 mg | ORAL_TABLET | Freq: Every day | ORAL | 3 refills | Status: DC

## 2020-03-26 MED ORDER — VITAMIN D (ERGOCALCIFEROL) 1.25 MG (50000 UNIT) PO CAPS
50000.0000 [IU] | ORAL_CAPSULE | ORAL | 3 refills | Status: DC
Start: 2020-03-26 — End: 2021-04-01

## 2020-03-26 MED ORDER — METFORMIN HCL 1000 MG PO TABS: 1000.0000 mg | ORAL_TABLET | Freq: Two times a day (BID) | ORAL | 3 refills | Status: DC

## 2020-03-26 NOTE — Progress Notes (Signed)
BP 140/87   Pulse (!) 114   Ht 5\' 9"  (1.753 m)   Wt 195 lb (88.5 kg)   SpO2 98%   BMI 28.80 kg/m    Subjective:   Patient ID: Philip [name], male    DOB: [birth year], 69 y.o.   MRN: [id number]  HPI: Philip [name] is a 69 y.o. male presenting on 03/26/2020 for Medical Management of Chronic Issues and Diabetes   HPI Hypertension Patient is currently on lisinopril, and their blood pressure today is 140/87. Patient denies any lightheadedness or dizziness. Patient denies headaches, blurred vision, chest pains, shortness of breath, or weakness. Denies any side effects from medication and is content with current medication.   Hyperlipidemia Patient is coming in for recheck of his hyperlipidemia. The patient is currently taking atorvastatin. They deny any issues with myalgias or history of liver damage from it. They deny any focal numbness or weakness or chest pain.   Type 2 diabetes mellitus Patient comes in today for recheck of his diabetes. Patient has been currently taking Metformin and Rybelsus, ran out of Rybelsus over the past month because of price.  A1c is 6.6 still today.. Patient is currently on an ACE inhibitor/ARB. Patient has seen an ophthalmologist this year. Patient denies any issues with their feet. The symptom started onset as an adult hypertension and hyperlipidemia ARE RELATED TO DM   Relevant past medical, surgical, family and social history reviewed and updated as indicated. Interim medical history since our last visit reviewed. Allergies and medications reviewed and updated.  Review of Systems  Constitutional: Negative for chills and fever.  Eyes: Negative for visual disturbance.  Respiratory: Negative for shortness of breath and wheezing.   Cardiovascular: Negative for chest pain and leg swelling.  Musculoskeletal: Negative for back pain and gait problem.  Skin: Negative for rash.  Neurological: Positive for numbness. Negative for dizziness.  All other systems  reviewed and are negative.   Per HPI unless specifically indicated above   Allergies as of 03/26/2020      Reactions   Ativan [lorazepam] Other (See Comments)   Caused patient not to remember several days.   Blueberry [vaccinium Angustifolium] Swelling   Fish Oil    Glucosamine Forte [nutritional Supplements]    Weak, flu like symptoms   Shellfish Allergy    Ultram [tramadol Hcl] Hives      Medication List       Accurate as of March 26, 2020  8:59 AM. If you have any questions, ask your nurse or doctor.        aspirin EC 81 MG tablet Take 81 mg by mouth in the morning and at bedtime.   atorvastatin 40 MG tablet Commonly known as: LIPITOR Take 1 tablet (40 mg total) by mouth daily.   lisinopril 20 MG tablet Commonly known as: ZESTRIL Take 1 tablet (20 mg total) by mouth daily.   metFORMIN 1000 MG tablet Commonly known as: GLUCOPHAGE Take 1 tablet (1,000 mg total) by mouth 2 (two) times daily with a meal.   Rybelsus 7 MG Tabs Generic drug: Semaglutide Take 7 mg by mouth daily.   Vitamin D (Ergocalciferol) 1.25 MG (50000 UNIT) Caps capsule Commonly known as: DRISDOL Take 1 capsule (50,000 Units total) by mouth every 7 (seven) days.        Objective:   BP 140/87   Pulse (!) 114   Ht 5\' 9"  (1.753 m)   Wt 195 lb (88.5 kg)   SpO2 98%   BMI  28.80 kg/m   Wt Readings from Last 3 Encounters:  03/26/20 195 lb (88.5 kg)  12/26/19 190 lb (86.2 kg)  09/21/19 206 lb 8 oz (93.7 kg)    Physical Exam Vitals and nursing note reviewed.  Constitutional:      General: He is not in acute distress.    Appearance: He is well-developed. He is not diaphoretic.  Eyes:     General: No scleral icterus.    Conjunctiva/sclera: Conjunctivae normal.  Neck:     Thyroid: No thyromegaly.  Cardiovascular:     Rate and Rhythm: Normal rate and regular rhythm.     Heart sounds: Normal heart sounds. No murmur heard.   Pulmonary:     Effort: Pulmonary effort is normal. No  respiratory distress.     Breath sounds: Normal breath sounds. No wheezing.  Musculoskeletal:        General: Normal range of motion.     Cervical back: Neck supple.  Lymphadenopathy:     Cervical: No cervical adenopathy.  Skin:    General: Skin is warm and dry.     Findings: No rash.  Neurological:     Mental Status: He is alert and oriented to person, place, and time.     Coordination: Coordination normal.  Psychiatric:        Behavior: Behavior normal.       Assessment & Plan:   Problem List Items Addressed This Visit      Cardiovascular and Mediastinum   Hypertension   Relevant Medications   atorvastatin (LIPITOR) 40 MG tablet   lisinopril (ZESTRIL) 20 MG tablet     Endocrine   Diabetes ([facility]) - Primary   Relevant Medications   atorvastatin (LIPITOR) 40 MG tablet   lisinopril (ZESTRIL) 20 MG tablet   metFORMIN (GLUCOPHAGE) 1000 MG tablet   Other Relevant Orders   Bayer DCA Hb A1c Waived     Other   Hyperlipidemia   Relevant Medications   atorvastatin (LIPITOR) 40 MG tablet   lisinopril (ZESTRIL) 20 MG tablet    Other Visit Diagnoses    Essential hypertension       Relevant Medications   atorvastatin (LIPITOR) 40 MG tablet   lisinopril (ZESTRIL) 20 MG tablet      Continue current medication.  A1c looks good at 6.6. Follow up plan: Return in about 3 months (around 06/24/2020), or if symptoms worsen or fail to improve, for Diabetes and hypertension cholesterol.  Counseling provided for all of the vaccine components Orders Placed This Encounter  Procedures  . [facility] Hb A1c [place], MD [facility] 03/26/2020, 8:59 AM

## 2020-03-28 ENCOUNTER — Telehealth: Payer: Self-pay | Admitting: Pharmacist

## 2020-03-28 NOTE — Telephone Encounter (Signed)
2 MONTH SUPPLY OF RYBELSUS 7MG  DAILY SHIPPED TO PCP OFFICE FROM [facility] [place] [facility] CALLED PATIENT TO NOTIFY WILL RE-APPLY FOR [facility] [place] [facility] 2022

## 2020-04-01 ENCOUNTER — Ambulatory Visit (INDEPENDENT_AMBULATORY_CARE_PROVIDER_SITE_OTHER): Payer: Medicare HMO

## 2020-04-01 DIAGNOSIS — Z0000 Encounter for general adult medical examination without abnormal findings: Secondary | ICD-10-CM | POA: Diagnosis not present

## 2020-04-01 NOTE — Progress Notes (Signed)
MEDICARE ANNUAL WELLNESS VISIT  04/01/2020  Telephone Visit Disclaimer This Medicare AWV was conducted by telephone due to national recommendations for restrictions regarding the COVID-19 Pandemic (e.g. social distancing).  I verified, using two identifiers, that I am speaking with Philip [name] or their authorized healthcare agent. I discussed the limitations, risks, security, and privacy concerns of performing an evaluation and management service by telephone and the potential availability of an in-person appointment in the future. The patient expressed understanding and agreed to proceed.  Location of Patient: Home Location of Provider (nurse):  [facility]  Subjective:    Philip [name] is a 69 y.o. male patient of Dettinger, Fransisca Kaufmann, MD who had a Medicare Annual Wellness Visit today via telephone. Philip [name] is Retired and lives alone. He has no children. He reports that he is socially active and does interact with friends/family regularly. He is minimally physically active and enjoys exercising and target shooting.  Patient Care Team: Dettinger, Fransisca Kaufmann, MD as PCP - General (Family Medicine) Lavera Guise, [facility] (Pharmacist)  Advanced Directives 04/01/2020 08/29/2019 08/14/2019 08/13/2019 07/27/2014 07/27/2014 07/23/2014  Does Patient Have a Medical Advance Directive? No No No No No No No  Would patient like information on creating a medical advance directive? No - Patient declined - No - Patient declined - No - patient declined information No - patient declined information No - patient declined information    Hospital Utilization Over the Past 12 Months: # of hospitalizations or ER visits: 1 # of surgeries: 0  Review of Systems    Patient reports that his overall health is better compared to last year.  History obtained from chart review and the patient  Patient Reported Readings (BP, Pulse, CBG, Weight, etc) none  Pain Assessment Pain : No/denies pain     Current Medications &  Allergies (verified) Allergies as of 04/01/2020      Reactions   Ativan [lorazepam] Other (See Comments)   Caused patient not to remember several days.   Blueberry [vaccinium Angustifolium] Swelling   Fish Oil    Glucosamine Forte [nutritional Supplements]    Weak, flu like symptoms   Shellfish Allergy    Ultram [tramadol Hcl] Hives      Medication List       Accurate as of April 01, 2020  8:43 AM. If you have any questions, ask your nurse or doctor.        aspirin EC 81 MG tablet Take 81 mg by mouth in the morning and at bedtime.   atorvastatin 40 MG tablet Commonly known as: LIPITOR Take 1 tablet (40 mg total) by mouth daily.   lisinopril 20 MG tablet Commonly known as: ZESTRIL Take 1 tablet (20 mg total) by mouth daily.   metFORMIN 1000 MG tablet Commonly known as: GLUCOPHAGE Take 1 tablet (1,000 mg total) by mouth 2 (two) times daily with a meal.   Rybelsus 7 MG Tabs Generic drug: Semaglutide Take 7 mg by mouth daily.   Vitamin D (Ergocalciferol) 1.25 MG (50000 UNIT) Caps capsule Commonly known as: DRISDOL Take 1 capsule (50,000 Units total) by mouth every 7 (seven) days.       History (reviewed): Past Medical History:  Diagnosis Date  . Chest pain   . Diabetes mellitus without complication ([place])   . Hyperlipidemia   . Hypertension   . PTSD (post-traumatic stress disorder)    Past Surgical History:  Procedure Laterality Date  . [place]   hernitated and ruptured  discs  . LUMBAR LAMINECTOMY/DECOMPRESSION MICRODISCECTOMY Right 08/01/2014   Procedure: Right L/4-5 Extraforaminal Diskectomy;  Surgeon: Eustace Moore, MD;  Location: [facility];  Service: Neurosurgery;  Laterality: Right;  Right L/4-5 Extraforaminal Diskectomy  . TONSILLECTOMY    . TONSILLECTOMY     Family History  Adopted: Yes   Social History   Socioeconomic History  . Marital status: Single    Spouse name: Not on file  . Number of children: Not on file  . Years of  education: senior college  . Highest education level: Bachelor's degree (e.g., BA, AB, BS)  Occupational History  . Occupation: Retired  Tobacco Use  . Smoking status: Current Some Day Smoker    Packs/day: 0.50    Years: 53.00    Pack years: 26.50  . Smokeless tobacco: Never Used  Vaping Use  . Vaping Use: Never used  Substance and Sexual Activity  . Alcohol use: No  . Drug use: No  . Sexual activity: Not Currently  Other Topics Concern  . Not on file  Social History Narrative  . Not on file   Social Determinants of Health   Financial Resource Strain: Not on file  Food Insecurity: Not on file  Transportation Needs: Not on file  Physical Activity: Not on file  Stress: Not on file  Social Connections: Not on file    Activities of Daily Living In your present state of health, do you have any difficulty performing the following activities: 04/01/2020 08/14/2019  Hearing? N Y  Vision? N N  Difficulty concentrating or making decisions? N N  Walking or climbing stairs? N N  Dressing or bathing? N N  Doing errands, shopping? N N  Preparing Food and eating ? N -  Using the Toilet? N -  In the past six months, have you accidently leaked urine? N -  Do you have problems with loss of bowel control? N -  Managing your Medications? N -  Managing your Finances? N -  Housekeeping or managing your Housekeeping? N -  Some recent data might be hidden    Patient Education/ Literacy How often do you need to have someone help you when you read instructions, pamphlets, or other written materials from your doctor or pharmacy?: 1 - Never What is the last grade level you completed in school?: 4 year college degree  Exercise Current Exercise Habits: Structured exercise class, Type of exercise: walking;strength training/weights, Time (Minutes): 60, Frequency (Times/Week): 6, Weekly Exercise (Minutes/Week): 360, Intensity: Mild  Diet Patient reports consuming 1 meals a day and 0 snack(s) a  day Patient reports that his primary diet is: Diabetic Patient reports that he does have regular access to food.   Depression Screen PHQ 2/9 Scores 04/01/2020 03/26/2020 12/26/2019 12/26/2019 09/21/2019 08/24/2019 12/29/2018  PHQ - 2 Score 1 2 2 5 5 5  0  PHQ- 9 Score - - - 7 7 7  -     Fall Risk Fall Risk  04/01/2020 03/26/2020 12/26/2019 09/21/2019 08/24/2019  Falls in the past year? 0 0 0 0 0  Number falls in past yr: - - - - -  Injury with Fall? - - - - -  Follow up Falls evaluation completed - - - -     Objective:  Chay Mazzoni seemed alert and oriented and he participated appropriately during our telephone visit.  Blood Pressure Weight BMI  BP Readings from Last 3 Encounters:  03/26/20 140/87  12/26/19 136/79  09/21/19 (!) 153/77   [facility]  Readings from Last 3 Encounters:  03/26/20 195 lb (88.5 kg)  12/26/19 190 lb (86.2 kg)  09/21/19 206 lb 8 oz (93.7 kg)   BMI Readings from Last 1 Encounters:  03/26/20 28.80 kg/m    *Unable to obtain current vital signs, weight, and BMI due to telephone visit type  Hearing/Vision  . [place] did not seem to have difficulty with hearing/understanding during the telephone conversation . Reports that he has had a formal eye exam by an eye care professional within the past year . Reports that he has not had a formal hearing evaluation within the past year *Unable to fully assess hearing and vision during telephone visit type  Cognitive Function: 6CIT Screen 04/01/2020  What Year? 0 points  What month? 0 points  What time? 0 points  Count back from 20 0 points  Months in reverse 0 points  Repeat phrase 0 points  Total Score 0   (Normal:0-7, Significant for Dysfunction: >8)  Normal Cognitive Function Screening: Yes   Immunization & Health Maintenance Record  There is no immunization history on file for this patient.  Patient has declined all vaccines.  Health Maintenance  Topic Date Due  . Hepatitis C Screening  Never done  . INFLUENZA VACCINE   07/17/2020 (Originally 11/18/2019)  . HEMOGLOBIN A1C  09/24/2020  . FOOT EXAM  12/25/2020  . OPHTHALMOLOGY EXAM  03/26/2021  . COLONOSCOPY  Discontinued  . TETANUS/TDAP  Discontinued  . COVID-19 Vaccine  Discontinued  . PNA vac Low Risk Adult  Discontinued       Assessment  This is a routine wellness examination for [employer].  Health Maintenance: Due or Overdue Health Maintenance Due  Topic Date Due  . Hepatitis C Screening  Never done    Philip [name] does not need a referral for Community Assistance: Care Management:   no Social Work:    no Prescription Assistance:  no Nutrition/Diabetes Education:  no   Plan:  Personalized Goals Goals Addressed            This Visit's Progress   . Patient Stated       04/01/2020 AWV Goal: Fall Prevention  . Over the next year, patient will decrease their risk for falls by: o Using assistive devices, such as a cane or walker, as needed o Identifying fall risks within their home and correcting them by: - Removing throw rugs - Adding handrails to stairs or ramps - Removing clutter and keeping a clear pathway throughout the home - Increasing light, especially at night - Adding shower handles/bars - Raising toilet seat o Identifying potential personal risk factors for falls: - Medication side effects - Incontinence/urgency - Vestibular dysfunction - Hearing loss - Musculoskeletal disorders - Neurological disorders - Orthostatic hypotension        Personalized Health Maintenance & Screening Recommendations  Pneumococcal vaccine  Influenza vaccine Td vaccine Colorectal cancer screening   Previously patient has declined all.  Lung Cancer Screening Recommended: yes (Low Dose CT Chest recommended if Age 42-80 years, 30 pack-year currently smoking OR have quit w/in past 15 years) Hepatitis C Screening recommended: yes HIV Screening recommended: no  Advanced Directives: Written information was not prepared per  patient's request.  Referrals & Orders No orders of the defined types were placed in this encounter.   Follow-up Plan . Follow-up with Dettinger, Fransisca Kaufmann, MD as planned    I have personally reviewed and noted the following in the patient's chart:   . Medical and social  history . Use of alcohol, tobacco or illicit drugs  . Current medications and supplements . Functional ability and status . Nutritional status . Physical activity . Advanced directives . List of other physicians . Hospitalizations, surgeries, and ER visits in previous 12 months . Vitals . Screenings to include cognitive, depression, and falls . Referrals and appointments  In addition, I have reviewed and discussed with Philip [name] certain preventive protocols, quality metrics, and best practice recommendations. A written personalized care plan for preventive services as well as general preventive health recommendations is available and can be mailed to the patient at his request.      Felicity Coyer, LPN    [phone]

## 2020-04-23 ENCOUNTER — Telehealth: Payer: Self-pay | Admitting: Pharmacist

## 2020-04-23 NOTE — Telephone Encounter (Signed)
   PATIENT ASSISTANCE APPLICATION SUBMITTED FOR [facility] PATIENT ASSISTANCE PROGRAM FOR 2022  MEDICATION: RYBELSUS 7MG   MEDICATION WILL SHIP TO PCP EVERY 4 MONTHS; WE MUST SUBMIT FOR REFILLS EVERY 4 MONTHS  MAY NEED PATIENT'S NEWEST BANK STATEMENT, BUT WILL SUBMIT TO TRY FOR APPROVAL WITH 2021 STATEMENT

## 2020-06-26 ENCOUNTER — Telehealth: Payer: Self-pay

## 2020-06-26 ENCOUNTER — Other Ambulatory Visit: Payer: Self-pay

## 2020-06-26 ENCOUNTER — Encounter: Payer: Self-pay | Admitting: Family Medicine

## 2020-06-26 ENCOUNTER — Ambulatory Visit (INDEPENDENT_AMBULATORY_CARE_PROVIDER_SITE_OTHER): Payer: Medicare HMO | Admitting: Family Medicine

## 2020-06-26 VITALS — BP 129/79 | HR 113 | Ht 69.0 in | Wt 194.0 lb

## 2020-06-26 DIAGNOSIS — S76302A Unspecified injury of muscle, fascia and tendon of the posterior muscle group at thigh level, left thigh, initial encounter: Secondary | ICD-10-CM

## 2020-06-26 DIAGNOSIS — E782 Mixed hyperlipidemia: Secondary | ICD-10-CM

## 2020-06-26 DIAGNOSIS — I10 Essential (primary) hypertension: Secondary | ICD-10-CM | POA: Diagnosis not present

## 2020-06-26 DIAGNOSIS — E119 Type 2 diabetes mellitus without complications: Secondary | ICD-10-CM | POA: Diagnosis not present

## 2020-06-26 LAB — BAYER DCA HB A1C WAIVED: HB A1C (BAYER DCA - WAIVED): 7.1 % — ABNORMAL HIGH (ref <7.0)

## 2020-06-26 MED ORDER — BACLOFEN 10 MG PO TABS: 10.0000 mg | ORAL_TABLET | Freq: Three times a day (TID) | ORAL | 0 refills | Status: DC

## 2020-06-26 MED ORDER — RYBELSUS 7 MG PO TABS: 7.0000 mg | ORAL_TABLET | Freq: Every day | ORAL | 3 refills | Status: DC

## 2020-06-26 NOTE — Progress Notes (Signed)
BP 129/79   Pulse (!) 113   Ht 5' 9"  (1.753 m)   Wt 194 lb (88 kg)   SpO2 97%   BMI 28.65 kg/m    Subjective:   Patient ID: Philip [name], male    DOB: 01/15/1951, 70 y.o.   MRN: [id number]  HPI: Philip [name] is a 70 y.o. male presenting on 06/26/2020 for Medical Management of Chronic Issues and Diabetes   HPI Type 2 diabetes mellitus Patient comes in today for recheck of his diabetes. Patient has been currently taking Metformin and Rybelsus. Patient is currently on an ACE inhibitor/ARB. Patient has not seen an ophthalmologist this year. Patient denies any issues with their feet. The symptom started onset as an adult hypertension hyperlipidemia ARE RELATED TO DM   Hypertension Patient is currently on lisinopril, and their blood pressure today is 129/79. Patient denies any lightheadedness or dizziness. Patient denies headaches, blurred vision, chest pains, shortness of breath, or weakness. Denies any side effects from medication and is content with current medication.   Hyperlipidemia Patient is coming in for recheck of his hyperlipidemia. The patient is currently taking atorvastatin. They deny any issues with myalgias or history of liver damage from it. They deny any focal numbness or weakness or chest pain.   Patient has pain in his left posterior hamstring right in the center, he says he was doing yoga and exercises and then felt like he pulled something in his been hurting since, this was 3 days ago.  He has been using some muscle rubs and gentle massage and it has helped but it still hurts and sometimes keeps him up at night.  Relevant past medical, surgical, family and social history reviewed and updated as indicated. Interim medical history since our last visit reviewed. Allergies and medications reviewed and updated.  Review of Systems  Constitutional: Negative for chills and fever.  Eyes: Negative for visual disturbance.  Respiratory: Negative for shortness of breath and  wheezing.   Cardiovascular: Negative for chest pain and leg swelling.  Musculoskeletal: Positive for myalgias. Negative for back pain and gait problem.  Skin: Negative for rash.  Neurological: Negative for dizziness, weakness and light-headedness.  All other systems reviewed and are negative.   Per HPI unless specifically indicated above   Allergies as of 06/26/2020      Reactions   Ativan [lorazepam] Other (See Comments)   Caused patient not to remember several days.   Blueberry [vaccinium Angustifolium] Swelling   Fish Oil    Glucosamine Forte [nutritional Supplements]    Weak, flu like symptoms   Shellfish Allergy    Ultram [tramadol Hcl] Hives      Medication List       Accurate as of June 26, 2020  8:41 AM. If you have any questions, ask your nurse or doctor.        aspirin EC 81 MG tablet Take 81 mg by mouth in the morning and at bedtime.   atorvastatin 40 MG tablet Commonly known as: LIPITOR Take 1 tablet (40 mg total) by mouth daily.   lisinopril 20 MG tablet Commonly known as: ZESTRIL Take 1 tablet (20 mg total) by mouth daily.   metFORMIN 1000 MG tablet Commonly known as: GLUCOPHAGE Take 1 tablet (1,000 mg total) by mouth 2 (two) times daily with a meal.   Rybelsus 7 MG Tabs Generic drug: Semaglutide Take 7 mg by mouth daily.   Vitamin D (Ergocalciferol) 1.25 MG (50000 UNIT) Caps capsule Commonly known as: DRISDOL  Take 1 capsule (50,000 Units total) by mouth every 7 (seven) days.        Objective:   Ht 5' 9"  (1.753 m)   Wt 194 lb (88 kg)   BMI 28.65 kg/m   Wt Readings from Last 3 Encounters:  06/26/20 194 lb (88 kg)  03/26/20 195 lb (88.5 kg)  12/26/19 190 lb (86.2 kg)    Physical Exam Vitals and nursing note reviewed.  Constitutional:      General: He is not in acute distress.    Appearance: He is well-developed. He is not diaphoretic.  Eyes:     General: No scleral icterus.    Conjunctiva/sclera: Conjunctivae normal.  Neck:      Thyroid: No thyromegaly.  Cardiovascular:     Rate and Rhythm: Normal rate and regular rhythm.     Heart sounds: Normal heart sounds. No murmur heard.   Pulmonary:     Effort: Pulmonary effort is normal. No respiratory distress.     Breath sounds: Normal breath sounds. No wheezing.  Musculoskeletal:        General: Tenderness (Central hamstring tenderness, pinpoint, likely pulled hamstring) present. Normal range of motion.     Cervical back: Neck supple.  Lymphadenopathy:     Cervical: No cervical adenopathy.  Skin:    General: Skin is warm and dry.     Findings: No rash.  Neurological:     Mental Status: He is alert and oriented to person, place, and time.     Coordination: Coordination normal.  Psychiatric:        Behavior: Behavior normal.       Assessment & Plan:   Problem List Items Addressed This Visit      Cardiovascular and Mediastinum   Hypertension   Relevant Orders   CMP14+EGFR   TSH     Endocrine   Diabetes ([place]) - Primary   Relevant Medications   Semaglutide (RYBELSUS) 7 MG TABS   Other Relevant Orders   CBC with Differential/Platelet   CMP14+EGFR   Lipid panel   Bayer DCA Hb A1c Waived   TSH     Other   Hyperlipidemia   Relevant Orders   Lipid panel   TSH    Other Visit Diagnoses    Left hamstring injury, initial encounter       Relevant Medications   baclofen (LIORESAL) 10 MG tablet      Continue current medication, A1c 7.1 so we will go back on the Rybelsus.  We will give the muscle relaxer to help with his leg pain.   Follow up plan: Return in about 3 months (around 09/26/2020), or if symptoms worsen or fail to improve, for Diabetes and hypertension and cholesterol.  Counseling provided for all of the vaccine components Orders Placed This Encounter  Procedures  . CBC with Differential/Platelet  . CMP14+EGFR  . Lipid panel  . Bayer DCA Hb A1c Waived  . TSH    Caryl Pina, MD [facility] 06/26/2020,  8:41 AM

## 2020-06-26 NOTE — Telephone Encounter (Signed)
[facility] in [place] informed that they may deactivate pt's Glipizide and Plavix. Pt is no longer taking them per Dr.Dettinger.  Per pharmacy pt had asked for the refills in in January via text.   Pt brought in medication today to be disguarded.

## 2020-06-27 LAB — CBC WITH DIFFERENTIAL/PLATELET
Basophils Absolute: 0.1 10*3/uL (ref 0.0–0.2)
Basos: 1 %
EOS (ABSOLUTE): 0.3 10*3/uL (ref 0.0–0.4)
Eos: 3 %
Hematocrit: 46 % (ref 37.5–51.0)
Hemoglobin: 15.3 g/dL (ref 13.0–17.7)
Immature Grans (Abs): 0 10*3/uL (ref 0.0–0.1)
Immature Granulocytes: 0 %
Lymphocytes Absolute: 3.3 10*3/uL — ABNORMAL HIGH (ref 0.7–3.1)
Lymphs: 39 %
MCH: 30.7 pg (ref 26.6–33.0)
MCHC: 33.3 g/dL (ref 31.5–35.7)
MCV: 92 fL (ref 79–97)
Monocytes Absolute: 0.8 10*3/uL (ref 0.1–0.9)
Monocytes: 10 %
Neutrophils Absolute: 4.1 10*3/uL (ref 1.4–7.0)
Neutrophils: 47 %
Platelets: 357 10*3/uL (ref 150–450)
RBC: 4.98 x10E6/uL (ref 4.14–5.80)
RDW: 12.9 % (ref 11.6–15.4)
WBC: 8.6 10*3/uL (ref 3.4–10.8)

## 2020-06-27 LAB — CMP14+EGFR
ALT: 15 IU/L (ref 0–44)
AST: 17 IU/L (ref 0–40)
Albumin/Globulin Ratio: 1.9 (ref 1.2–2.2)
Albumin: 4.5 g/dL (ref 3.8–4.8)
Alkaline Phosphatase: 98 IU/L (ref 44–121)
BUN/Creatinine Ratio: 11 (ref 10–24)
BUN: 11 mg/dL (ref 8–27)
Bilirubin Total: 0.4 mg/dL (ref 0.0–1.2)
CO2: 20 mmol/L (ref 20–29)
Calcium: 11.8 mg/dL — ABNORMAL HIGH (ref 8.6–10.2)
Chloride: 103 mmol/L (ref 96–106)
Creatinine, Ser: 0.96 mg/dL (ref 0.76–1.27)
Globulin, Total: 2.4 g/dL (ref 1.5–4.5)
Glucose: 155 mg/dL — ABNORMAL HIGH (ref 65–99)
Potassium: 4.9 mmol/L (ref 3.5–5.2)
Sodium: 139 mmol/L (ref 134–144)
Total Protein: 6.9 g/dL (ref 6.0–8.5)
eGFR: 86 mL/min/{1.73_m2} (ref >59)

## 2020-06-27 LAB — LIPID PANEL
Chol/HDL Ratio: 3.8 ratio (ref 0.0–5.0)
Cholesterol, Total: 169 mg/dL (ref 100–199)
HDL: 45 mg/dL (ref >39)
LDL Chol Calc (NIH): 91 mg/dL (ref 0–99)
Triglycerides: 195 mg/dL — ABNORMAL HIGH (ref 0–149)
VLDL Cholesterol Cal: 33 mg/dL (ref 5–40)

## 2020-06-27 LAB — TSH: TSH: 1.27 u[IU]/mL (ref 0.450–4.500)

## 2020-07-03 ENCOUNTER — Other Ambulatory Visit: Payer: Self-pay | Admitting: *Deleted

## 2020-07-03 DIAGNOSIS — E785 Hyperlipidemia, unspecified: Secondary | ICD-10-CM

## 2020-09-12 LAB — HM DIABETES EYE EXAM

## 2020-09-26 ENCOUNTER — Ambulatory Visit (INDEPENDENT_AMBULATORY_CARE_PROVIDER_SITE_OTHER): Payer: Medicare HMO | Admitting: Family Medicine

## 2020-09-26 ENCOUNTER — Encounter: Payer: Self-pay | Admitting: Family Medicine

## 2020-09-26 ENCOUNTER — Other Ambulatory Visit: Payer: Self-pay

## 2020-09-26 VITALS — BP 165/91 | HR 101 | Ht 69.0 in | Wt 199.0 lb

## 2020-09-26 DIAGNOSIS — E782 Mixed hyperlipidemia: Secondary | ICD-10-CM

## 2020-09-26 DIAGNOSIS — I10 Essential (primary) hypertension: Secondary | ICD-10-CM

## 2020-09-26 DIAGNOSIS — E119 Type 2 diabetes mellitus without complications: Secondary | ICD-10-CM | POA: Diagnosis not present

## 2020-09-26 LAB — BAYER DCA HB A1C WAIVED: HB A1C (BAYER DCA - WAIVED): 6.7 % (ref <7.0)

## 2020-09-26 NOTE — Progress Notes (Addendum)
BP (!) 165/91   Pulse (!) 101   Ht 5\' 9"  (1.753 m)   Wt 199 lb (90.3 kg)   SpO2 97%   BMI 29.39 kg/m    Subjective:   Patient ID: Philip [name], male    DOB: 1950-07-06, 70 y.o.   MRN: [id number]  HPI: Philip [name] is a 70 y.o. male presenting on 09/26/2020 for Medical Management of Chronic Issues and Diabetes   HPI Type 2 diabetes mellitus Patient comes in today for recheck of his diabetes. Patient has been currently taking rybelsus and metformin, has been out of the Rybelsus for a week because of cost but is going to reapply for prescription assistance.  His A1c is still 6.7. Patient is currently on an ACE inhibitor/ARB. Patient has seen an ophthalmologist this year. Patient denies any issues with their feet. The symptom started onset as an adult hypertension and hyperlipidemia ARE RELATED TO DM   Hypertension Patient is currently on lisinopril, and their blood pressure today is 165/91. Patient denies any lightheadedness or dizziness. Patient denies headaches, blurred vision, chest pains, shortness of breath, or weakness. Denies any side effects from medication and is content with current medication.   Hyperlipidemia Patient is coming in for recheck of his hyperlipidemia. The patient is currently taking atorvastatin. They deny any issues with myalgias or history of liver damage from it. They deny any focal numbness or weakness or chest pain.   Relevant past medical, surgical, family and social history reviewed and updated as indicated. Interim medical history since our last visit reviewed. Allergies and medications reviewed and updated.  Review of Systems  Constitutional:  Negative for chills and fever.  Eyes:  Negative for visual disturbance.  Respiratory:  Negative for shortness of breath and wheezing.   Cardiovascular:  Negative for chest pain and leg swelling.  Musculoskeletal:  Negative for back pain and gait problem.  Skin:  Negative for rash.  Neurological:  Negative for  dizziness, weakness and light-headedness.  All other systems reviewed and are negative.  Per HPI unless specifically indicated above   Allergies as of 09/26/2020       Reactions   Ativan [lorazepam] Other (See Comments)   Caused patient not to remember several days.   Blueberry [vaccinium Angustifolium] Swelling   Fish Oil    Glucosamine Forte [nutritional Supplements]    Weak, flu like symptoms   Shellfish Allergy    Ultram [tramadol Hcl] Hives        Medication List        Accurate as of September 26, 2020  9:14 AM. If you have any questions, ask your nurse or doctor.          STOP taking these medications    baclofen 10 MG tablet Commonly known as: LIORESAL Stopped by: Fransisca Kaufmann Kazue Cerro, MD       TAKE these medications    aspirin EC 81 MG tablet Take 81 mg by mouth in the morning and at bedtime.   atorvastatin 40 MG tablet Commonly known as: LIPITOR Take 1 tablet (40 mg total) by mouth daily.   lisinopril 20 MG tablet Commonly known as: ZESTRIL Take 1 tablet (20 mg total) by mouth daily.   metFORMIN 1000 MG tablet Commonly known as: GLUCOPHAGE Take 1 tablet (1,000 mg total) by mouth 2 (two) times daily with a meal.   Rybelsus 7 MG Tabs Generic drug: Semaglutide Take 7 mg by mouth daily.   Vitamin D (Ergocalciferol) 1.25 MG (50000 UNIT) Caps  capsule Commonly known as: DRISDOL Take 1 capsule (50,000 Units total) by mouth every 7 (seven) days.         Objective:   BP (!) 165/91   Pulse (!) 101   Ht 5\' 9"  (1.753 m)   Wt 199 lb (90.3 kg)   SpO2 97%   BMI 29.39 kg/m   Wt Readings from Last 3 Encounters:  09/26/20 199 lb (90.3 kg)  06/26/20 194 lb (88 kg)  03/26/20 195 lb (88.5 kg)    Physical Exam Vitals and nursing note reviewed.  Constitutional:      General: He is not in acute distress.    Appearance: He is well-developed. He is not diaphoretic.  Eyes:     General: No scleral icterus.    Conjunctiva/sclera: Conjunctivae normal.   Neck:     Thyroid: No thyromegaly.  Cardiovascular:     Rate and Rhythm: Normal rate and regular rhythm.     Heart sounds: Normal heart sounds. No murmur heard. Pulmonary:     Effort: Pulmonary effort is normal. No respiratory distress.     Breath sounds: Normal breath sounds. No wheezing.  Musculoskeletal:        General: Normal range of motion.     Cervical back: Neck supple.  Lymphadenopathy:     Cervical: No cervical adenopathy.  Skin:    General: Skin is warm and dry.     Findings: No rash.  Neurological:     Mental Status: He is alert and oriented to person, place, and time.     Coordination: Coordination normal.  Psychiatric:        Behavior: Behavior normal.    Results for orders placed or performed in visit on 09/23/20  HM DIABETES EYE EXAM  Result Value Ref Range   HM Diabetic Eye Exam No Retinopathy No Retinopathy    Assessment & Plan:   Problem List Items Addressed This Visit       Cardiovascular and Mediastinum   Hypertension     Endocrine   Diabetes ([place]) - Primary   Relevant Orders   Bayer DCA Hb A1c Waived     Other   Hyperlipidemia    Continue current medication, gave sample of Rybelsus.  He is going to follow-up with [facility] next week to do prescription assistance.  He does have some aches and pains in his wrist and his leg but he says they are doing better.  He does keep very active.  BP elevated because of some aches and pains, will monitor closely and recheck at next visit. Follow up plan: Return in about 3 months (around 12/27/2020), or if symptoms worsen or fail to improve, for Diabetes and hypertension and cholesterol.  Counseling provided for all of the vaccine components Orders Placed This Encounter  Procedures   Bayer [place] Hb A1c Philip Allahna Husband, MD [facility] 09/26/2020, 9:14 AM

## 2020-10-01 ENCOUNTER — Other Ambulatory Visit: Payer: Self-pay

## 2020-10-01 ENCOUNTER — Ambulatory Visit (INDEPENDENT_AMBULATORY_CARE_PROVIDER_SITE_OTHER): Payer: Medicare HMO | Admitting: Pharmacist

## 2020-10-01 DIAGNOSIS — E119 Type 2 diabetes mellitus without complications: Secondary | ICD-10-CM | POA: Diagnosis not present

## 2020-10-01 NOTE — Progress Notes (Signed)
Chronic Care Management Pharmacy Note  10/01/2020 Name:  Philip [name] MRN:  [id number] DOB:  Jan 18, 1951  Summary: patient has been out of Rybelsus due to cost/program assistance issues  Recommendations/Changes made from today's visit: assisted patient with   Plan: RESTART RYBELSUS; CONTINUE METFORMIN; HEALTHY PLATE METHOD FOR EATING  Subjective: Philip [name] is an 70 y.o. year old male who is a primary patient of Dettinger, Fransisca Kaufmann, MD.  The CCM team was consulted for assistance with disease management and care coordination needs.    Engaged with patient face to face for initial visit in response to provider referral for pharmacy case management and/or care coordination services.   Consent to Services:  The patient was given the following information about Chronic Care Management services today, agreed to services, and gave verbal consent: 1. CCM service includes personalized support from designated clinical staff supervised by the primary care provider, including individualized plan of care and coordination with other care providers 2. 24/7 contact phone numbers for assistance for urgent and routine care needs. 3. Service will only be billed when office clinical staff spend 20 minutes or more in a month to coordinate care. 4. Only one practitioner may furnish and bill the service in a calendar month. 5.The patient may stop CCM services at any time (effective at the end of the month) by phone call to the office staff. 6. The patient will be responsible for cost sharing (co-pay) of up to 20% of the service fee (after annual deductible is met). Patient agreed to services and consent obtained.  Patient Care Team: Dettinger, Fransisca Kaufmann, MD as PCP - General (Family Medicine) Lavera Guise, [facility] (Pharmacist)  Recent office visits: PCP 09/26/20  Recent consult visits: [facility] visits: None in previous 6 months  Objective:  Lab Results  Component Value Date   CREATININE 0.96  06/26/2020   CREATININE 1.04 12/26/2019   CREATININE 0.83 09/21/2019    Lab Results  Component Value Date   HGBA1C 6.7 09/26/2020   Last diabetic Eye exam:  Lab Results  Component Value Date/Time   HMDIABEYEEXA No Retinopathy 09/12/2020 12:00 AM    Last diabetic Foot exam: No results found for: HMDIABFOOTEX      Component Value Date/Time   CHOL 169 06/26/2020 0840   TRIG 195 (H) 06/26/2020 0840   TRIG 232 (H) 05/08/2013 1200   HDL 45 06/26/2020 0840   HDL 52 05/08/2013 1200   CHOLHDL 3.8 06/26/2020 0840   CHOLHDL 6.0 08/14/2019 0927   VLDL 74 (H) 08/14/2019 0927   LDLCALC 91 06/26/2020 0840   LDLCALC 200 (H) 05/08/2013 1200    Hepatic Function Latest Ref Rng & Units 06/26/2020 12/26/2019 09/21/2019  Total Protein 6.0 - 8.5 g/dL 6.9 6.7 6.8  Albumin 3.8 - 4.8 g/dL 4.5 4.5 4.6  AST 0 - 40 IU/L 17 21 15   ALT 0 - 44 IU/L 15 21 15   Alk Phosphatase 44 - 121 IU/L 98 84 89  Total Bilirubin 0.0 - 1.2 mg/dL 0.4 0.5 0.4  Bilirubin, Direct 0.00 - 0.40 mg/dL - - -    Lab Results  Component Value Date/Time   TSH 1.270 06/26/2020 08:40 AM   TSH 1.030 12/26/2019 09:23 AM    CBC Latest Ref Rng & Units 06/26/2020 12/26/2019 08/13/2019  WBC 3.4 - 10.8 x10E3/uL 8.6 11.3(H) -  Hemoglobin 13.0 - 17.7 g/dL 15.3 15.2 16.0  Hematocrit 37.5 - 51.0 % 46.0 43.7 47.0  Platelets 150 - 450 x10E3/uL 357 351 -  Lab Results  Component Value Date/Time   VD25OH 13.2 (L) 10/23/2018 08:39 AM   VD25OH 18.2 (L) 08/16/2016 02:41 PM    Clinical ASCVD: Yes  The 10-year ASCVD risk score Mikey Bussing DC Jr., et al., 2013) is: 55.2%   Values used to calculate the score:     Age: 28 years     Sex: Male     Is Non-Hispanic African American: No     Diabetic: Yes     Tobacco smoker: Yes     Systolic Blood Pressure: 856 mmHg     Is BP treated: Yes     HDL Cholesterol: 45 mg/dL     Total Cholesterol: 169 mg/dL    Other: (CHADS2VASc if Afib, PHQ9 if depression, MMRC or CAT for COPD, ACT, DEXA)  Social History    Tobacco Use  Smoking Status Some Days   Packs/day: 0.50   Years: 53.00   Pack years: 26.50   Types: Cigarettes  Smokeless Tobacco Never   BP Readings from Last 3 Encounters:  09/26/20 (!) 165/91  06/26/20 129/79  03/26/20 140/87   Pulse Readings from Last 3 Encounters:  09/26/20 (!) 101  06/26/20 (!) 113  03/26/20 (!) 114   Wt Readings from Last 3 Encounters:  09/26/20 199 lb (90.3 kg)  06/26/20 194 lb (88 kg)  03/26/20 195 lb (88.5 kg)    Assessment: Review of patient past medical history, allergies, medications, health status, including review of consultants reports, laboratory and other test data, was performed as part of comprehensive evaluation and provision of chronic care management services.   SDOH:  (Social Determinants of Health) assessments and interventions performed:    CCM Care Plan  Allergies  Allergen Reactions   Ativan [Lorazepam] Other (See Comments)    Caused patient not to remember several days.   Blueberry [Vaccinium Angustifolium] Swelling   Fish Oil    Glucosamine Forte [Nutritional Supplements]     Weak, flu like symptoms   Shellfish Allergy    Ultram [Tramadol Hcl] Hives    Medications Reviewed Today     Reviewed by Lavera Guise, [facility] (Pharmacist) on 10/08/20 at [phone]  Med List Status: <None>   Medication Order Taking? Sig Documenting Provider Last Dose Status Informant  aspirin EC 81 MG tablet [id number] No Take 81 mg by mouth in the morning and at bedtime.  [provider] Taking Active Self  atorvastatin (LIPITOR) 40 MG tablet [id number] No Take 1 tablet (40 mg total) by mouth daily. Dettinger, Fransisca Kaufmann, MD Taking Active   lisinopril (ZESTRIL) 20 MG tablet [id number] No Take 1 tablet (20 mg total) by mouth daily. Dettinger, Fransisca Kaufmann, MD Taking Active   metFORMIN (GLUCOPHAGE) 1000 MG tablet [id number] No Take 1 tablet (1,000 mg total) by mouth 2 (two) times daily with a meal. Dettinger, Fransisca Kaufmann, MD Taking Active   Semaglutide  (RYBELSUS) 7 MG TABS [id number] No Take 7 mg by mouth daily. Dettinger, Fransisca Kaufmann, MD Taking Active   Vitamin D, Ergocalciferol, (DRISDOL) 1.25 MG (50000 UNIT) CAPS capsule [id number] No Take 1 capsule (50,000 Units total) by mouth every 7 (seven) days. Dettinger, Fransisca Kaufmann, MD Taking Active             Patient Active Problem List   Diagnosis Date Noted   Status post CVA 08/14/2019   Numbness 08/13/2019   Ataxia 08/13/2019   S/P lumbar microdiscectomy 08/01/2014   PTSD (post-traumatic stress disorder) 07/23/2014   Hyperlipidemia    Vitamin D deficiency 08/20/2013  Hypercalcemia 06/19/2013   Diabetes ([place]) 10/30/2012   Non compliance with medical treatment 10/30/2012   Panic disorder 10/30/2012   Hypertension 09/24/2010     There is no immunization history on file for this patient.  Conditions to be addressed/monitored: HTN, HLD, and DMII  Care Plan : PHARMD MEDICATION MANAGEMENT  Updates made by Lavera Guise, RPH since 10/08/2020 12:00 AM     Problem: DISEASE PROGRESSION PREVENTION   Priority: High     Long-Range Goal: T2DM   This Visit's Progress: On track  Priority: High  Note:   Current Barriers:  Unable to independently afford treatment regimen Unable to achieve control of T2DM ; mostly due to cost and diet/lifestyle  Pharmacist Clinical Goal(s):  Over the next 90 days, patient will verbalize ability to afford treatment regimen achieve adherence to monitoring guidelines and medication adherence to achieve therapeutic efficacy achieve control of t2dm as evidenced by goal A1c<7%  through collaboration with PharmD and provider.   Interventions: 1:1 collaboration with Dettinger, Fransisca Kaufmann, MD regarding development and update of comprehensive plan of care as evidenced by provider attestation and co-signature Inter-disciplinary care team collaboration (see longitudinal plan of care) Comprehensive medication review performed; medication list updated in electronic  medical record  Diabetes: Uncontrolled/controlled; current treatment:rybelsus (has been out due to cost); metformin GFR 86 On statin, on ACEi Current glucose readings: fasting glucose: <170, post prandial glucose: n/a Denies/reports hypoglycemic/hyperglycemic symptoms Current meal patterns: breakfast:  Discussed meal planning options and Plate method for healthy eating Avoid sugary drinks and desserts Incorporate balanced protein, non starchy veggies, 1 serving of carbohydrate with each meal Increase water intake Increase physical activity as able Current exercise: n/a Counseled on rybelsus, dietary changes Assessed patient finances. Application submitted for Novo nordisk patient assistance (rybelsus); re-enrolled for this year ; sample given to bridge patient to PAP supply; 4 month supple will ship to PCP office  Patient Goals/Self-Care Activities Over the next 90 days, patient will:  - take medications as prescribed check glucose daily (fasting), document, and provide at future appointments  Follow Up Plan: Telephone follow up appointment with care management team member scheduled for: 1 month      Medication Assistance: Application for novonordisk (rybelsus)  medication assistance program. in process.  Anticipated assistance start date TBD.  See plan of care for additional detail.  Patient's preferred pharmacy is:  [facility] [place], [place] - [address] [place] [address] Phone: [phone] Fax: [phone]  Uses pill box? No - N/A Pt endorses 100% compliance  Follow Up:  Patient agrees to Care Plan and Follow-up.  Plan: Telephone follow up appointment with care management team member scheduled for:  1 MONTH  Regina Eck, PharmD, BCPS Clinical Pharmacist, [place]  II Phone [phone]

## 2020-10-08 NOTE — Patient Instructions (Signed)
Visit Information  PATIENT GOALS:  Goals Addressed               This Visit's Progress     Patient Stated     T2DM (pt-stated)        Current Barriers:  Unable to independently afford treatment regimen Unable to achieve control of T2DM ; mostly due to cost and diet/lifestyle  Pharmacist Clinical Goal(s):  Over the next 90 days, patient will verbalize ability to afford treatment regimen achieve adherence to monitoring guidelines and medication adherence to achieve therapeutic efficacy achieve control of t2dm as evidenced by goal A1c<7% through collaboration with PharmD and provider.   Interventions: 1:1 collaboration with Dettinger, Fransisca Kaufmann, MD regarding development and update of comprehensive plan of care as evidenced by provider attestation and co-signature Inter-disciplinary care team collaboration (see longitudinal plan of care) Comprehensive medication review performed; medication list updated in electronic medical record  Diabetes: Uncontrolled/controlled; current treatment:rybelsus (has been out due to cost); metformin GFR 86 On statin, on ACEi Current glucose readings: fasting glucose: <170, post prandial glucose: n/a Denies/reports hypoglycemic/hyperglycemic symptoms Current meal patterns: breakfast:  Discussed meal planning options and Plate method for healthy eating Avoid sugary drinks and desserts Incorporate balanced protein, non starchy veggies, 1 serving of carbohydrate with each meal Increase water intake Increase physical activity as able Current exercise: n/a Counseled on rybelsus, dietary changes Assessed patient finances. Application submitted for Novo nordisk patient assistance (rybelsus); re-enrolled for this year; sample given to bridge patient to PAP supply; 4 month supple will ship to PCP office  Patient Goals/Self-Care Activities Over the next 90 days, patient will:  - take medications as prescribed check glucose daily (fasting), document, and  provide at future appointments  Follow Up Plan: Telephone follow up appointment with care management team member scheduled for: 1 month          The patient verbalized understanding of instructions, educational materials, and care plan provided today and declined offer to receive copy of patient instructions, educational materials, and care plan.   Telephone follow up appointment with care management team member scheduled for: 1 MONTH  Signature Regina Eck, PharmD, BCPS Clinical Pharmacist, [place]  II Phone [phone]

## 2020-10-22 ENCOUNTER — Ambulatory Visit: Payer: Self-pay | Admitting: Pharmacist

## 2020-10-22 DIAGNOSIS — E119 Type 2 diabetes mellitus without complications: Secondary | ICD-10-CM

## 2020-10-22 NOTE — Patient Instructions (Signed)
Visit Information  PATIENT GOALS:  Goals Addressed               This Visit's Progress     Patient Stated     T2DM (pt-stated)        Current Barriers:  Unable to independently afford treatment regimen Unable to achieve control of T2DM ; mostly due to cost and diet/lifestyle  Pharmacist Clinical Goal(s):  Over the next 90 days, patient will verbalize ability to afford treatment regimen achieve adherence to monitoring guidelines and medication adherence to achieve therapeutic efficacy achieve control of t2dm as evidenced by goal A1c<7% through collaboration with PharmD and provider.   Interventions: 1:1 collaboration with Dettinger, Fransisca Kaufmann, MD regarding development and update of comprehensive plan of care as evidenced by provider attestation and co-signature Inter-disciplinary care team collaboration (see longitudinal plan of care) Comprehensive medication review performed; medication list updated in electronic medical record  Diabetes: Controlled; current treatment: rybelsus 7mg  (has been out due to cost-sample given, want to assist to keep patient controlled); metformin GFR 86 On statin, on ACEi Current glucose readings: fasting glucose: <170, post prandial glucose: n/a Denies/reports hypoglycemic/hyperglycemic symptoms Current meal patterns: breakfast:  Discussed meal planning options and Plate method for healthy eating Avoid sugary drinks and desserts Incorporate balanced protein, non starchy veggies, 1 serving of carbohydrate with each meal Increase water intake Increase physical activity as able Current exercise: n/a Counseled on rybelsus, dietary changes Assessed patient finances. Called novo nordisk-patient approved for 2022-rybelsus to ship to PCP office in 10-14 business days; sample given to bridge patient to PAP supply  Patient Goals/Self-Care Activities Over the next 90 days, patient will:  - take medications as prescribed check glucose daily (fasting),  document, and provide at future appointments  Follow Up Plan: Telephone follow up appointment with care management team member scheduled for: 1 month          The patient verbalized understanding of instructions, educational materials, and care plan provided today and declined offer to receive copy of patient instructions, educational materials, and care plan.   Telephone follow up appointment with care management team member scheduled for: 1 MONTH  Signature Regina Eck, PharmD, BCPS Clinical Pharmacist, [place]  II Phone [phone]

## 2020-10-22 NOTE — Progress Notes (Signed)
Chronic Care Management Pharmacy Note  10/22/2020 Name:  Philip [name] MRN:  [id number] DOB:  06-02-1950  Summary: diabetes management  Recommendations/Changes made from today's visit:  Diabetes: Controlled; current treatment: rybelsus 48m (has been out due to cost-sample given, want to assist to keep patient controlled); metformin GFR 86 On statin, on ACEi Current glucose readings: fasting glucose: <170, post prandial glucose: n/a Denies/reports hypoglycemic/hyperglycemic symptoms Counseled on rybelsus, dietary changes Assessed patient finances. Called novo nordisk-patient approved for 2022-rybelsus to ship to PCP office in 10-14 business days; sample given to bridge patient to PAP supply  Plan: f/U in 1 month with PharmD  Subjective: Philip [name] an 70y.o. year old male who is a primary patient of Dettinger, JFransisca Kaufmann MD.  Philip CCM team was consulted for assistance with disease management and care coordination needs.    Engaged with patient by telephone for follow up visit in response to provider referral for pharmacy case management and/or care coordination services.   Consent to Services:  Philip patient was given information about Chronic Care Management services, agreed to services, and gave verbal consent prior to initiation of services.  Please see initial visit note for detailed documentation.   Patient Care Team: Dettinger, JFransisca Kaufmann MD as PCP - General (Family Medicine) [facility]Lavera [name] [name])   Objective:  Lab Results  Component Value Date   CREATININE 0.96 06/26/2020   CREATININE 1.04 12/26/2019   CREATININE 0.83 09/21/2019    Lab Results  Component Value Date   HGBA1C 6.7 09/26/2020   Last diabetic Eye exam:  Lab Results  Component Value Date/Time   HMDIABEYEEXA No Retinopathy 09/12/2020 12:00 AM    Last diabetic Foot exam: No results found for: HMDIABFOOTEX      Component Value Date/Time   CHOL 169 06/26/2020 0840   TRIG 195 (H)  06/26/2020 0840   TRIG 232 (H) 05/08/2013 1200   HDL 45 06/26/2020 0840   HDL 52 05/08/2013 1200   CHOLHDL 3.8 06/26/2020 0840   CHOLHDL 6.0 08/14/2019 0927   VLDL 74 (H) 08/14/2019 0927   LDLCALC 91 06/26/2020 0840   LDLCALC 200 (H) 05/08/2013 1200    Hepatic Function Latest Ref Rng & Units 06/26/2020 12/26/2019 09/21/2019  Total Protein 6.0 - 8.5 g/dL 6.9 6.7 6.8  Albumin 3.8 - 4.8 g/dL 4.5 4.5 4.6  AST 0 - 40 IU/L 17 21 15   ALT 0 - 44 IU/L 15 21 15   Alk Phosphatase 44 - 121 IU/L 98 84 89  Total Bilirubin 0.0 - 1.2 mg/dL 0.4 0.5 0.4  Bilirubin, Direct 0.00 - 0.40 mg/dL - - -    Lab Results  Component Value Date/Time   TSH 1.270 06/26/2020 08:40 AM   TSH 1.030 12/26/2019 09:23 AM    CBC Latest Ref Rng & Units 06/26/2020 12/26/2019 08/13/2019  WBC 3.4 - 10.8 x10E3/uL 8.6 11.3(H) -  Hemoglobin 13.0 - 17.7 g/dL 15.3 15.2 16.0  Hematocrit 37.5 - 51.0 % 46.0 43.7 47.0  Platelets 150 - 450 x10E3/uL 357 351 -    Lab Results  Component Value Date/Time   VD25OH 13.2 (L) 10/23/2018 08:39 AM   VD25OH 18.2 (L) 08/16/2016 02:41 PM    Clinical ASCVD: No  Philip 10-year ASCVD risk score (Philip [name]., et al., 2013) is: 55.2%   Values used to calculate Philip score:     Age: 70years     Sex: Male     Is Non-Hispanic African American: No  Diabetic: Yes     Tobacco smoker: Yes     Systolic Blood Pressure: 532 mmHg     Is BP treated: Yes     HDL Cholesterol: 45 mg/dL     Total Cholesterol: 169 mg/dL    Other: (CHADS2VASc if Afib, PHQ9 if depression, MMRC or CAT for COPD, ACT, DEXA)  Social History   Tobacco Use  Smoking Status Some Days   Packs/day: 0.50   Years: 53.00   Pack years: 26.50   Types: Cigarettes  Smokeless Tobacco Never   BP Readings from Last 3 Encounters:  09/26/20 (!) 165/91  06/26/20 129/79  03/26/20 140/87   Pulse Readings from Last 3 Encounters:  09/26/20 (!) 101  06/26/20 (!) 113  03/26/20 (!) 114   Wt Readings from Last 3 Encounters:  09/26/20 199 lb  (90.3 kg)  06/26/20 194 lb (88 kg)  03/26/20 195 lb (88.5 kg)    Assessment: Review of patient past medical history, allergies, medications, health status, including review of consultants reports, laboratory and other test data, was performed as part of comprehensive evaluation and provision of chronic care management services.   SDOH:  (Social Determinants of Health) assessments and interventions performed:    CCM Care Plan  Allergies  Allergen Reactions   Ativan [Lorazepam] Other (See Comments)    Caused patient not to remember several days.   Blueberry [Vaccinium Angustifolium] Swelling   Fish Oil    Glucosamine Forte [Nutritional Supplements]     Weak, flu like symptoms   Shellfish Allergy    Ultram [Tramadol Hcl] Hives    Medications Reviewed Today     Reviewed by Philip [name], [facility] (Pharmacist) on 10/22/20 at 1026  Med List Status: <None>   Medication Order Taking? Sig Documenting Provider Last Dose Status Informant  aspirin EC 81 MG tablet [id number] No Take 81 mg by mouth in Philip morning and at bedtime.  [provider] Taking Active Self  atorvastatin (LIPITOR) 40 MG tablet [id number] No Take 1 tablet (40 mg total) by mouth daily. Dettinger, Fransisca Kaufmann, MD Taking Active   lisinopril (ZESTRIL) 20 MG tablet [id number] No Take 1 tablet (20 mg total) by mouth daily. Dettinger, Fransisca Kaufmann, MD Taking Active   metFORMIN (GLUCOPHAGE) 1000 MG tablet [id number] No Take 1 tablet (1,000 mg total) by mouth 2 (two) times daily with a meal. Dettinger, Fransisca Kaufmann, MD Taking Active   Semaglutide (RYBELSUS) 7 MG TABS [id number] No Take 7 mg by mouth daily. Dettinger, Fransisca Kaufmann, MD Taking Active   Vitamin D, Ergocalciferol, (DRISDOL) 1.25 MG (50000 UNIT) CAPS capsule [id number] No Take 1 capsule (50,000 Units total) by mouth every 7 (seven) days. Dettinger, Fransisca Kaufmann, MD Taking Active             Patient Active Problem List   Diagnosis Date Noted   Status post CVA 08/14/2019   Numbness  08/13/2019   Ataxia 08/13/2019   S/P lumbar microdiscectomy 08/01/2014   PTSD (post-traumatic stress disorder) 07/23/2014   Hyperlipidemia    Vitamin D deficiency 08/20/2013   Hypercalcemia 06/19/2013   Diabetes ([place]) 10/30/2012   Non compliance with medical treatment 10/30/2012   Panic disorder 10/30/2012   Hypertension 09/24/2010     There is no immunization history on file for this patient.  Conditions to be addressed/monitored: DMII  Care Plan : PHARMD MEDICATION MANAGEMENT  Updates made by Philip [name], RPH since 10/22/2020 12:00 AM     Problem: DISEASE PROGRESSION PREVENTION   Priority:  High     Long-Range Goal: T2DM   Recent Progress: On track  Priority: High  Note:   Current Barriers:  Unable to independently afford treatment regimen Unable to achieve control of T2DM ; mostly due to cost and diet/lifestyle  Pharmacist Clinical Goal(s):  Over Philip next 90 days, patient will verbalize ability to afford treatment regimen achieve adherence to monitoring guidelines and medication adherence to achieve therapeutic efficacy achieve control of t2dm as evidenced by goal A1c<7%  through collaboration with PharmD and provider.   Interventions: 1:1 collaboration with Dettinger, Fransisca Kaufmann, MD regarding development and update of comprehensive plan of care as evidenced by provider attestation and co-signature Inter-disciplinary care team collaboration (see longitudinal plan of care) Comprehensive medication review performed; medication list updated in electronic medical record  Diabetes: Controlled; current treatment: rybelsus 72m (has been out due to cost-sample given, want to assist to keep patient controlled); metformin GFR 86 On statin, on ACEi Current glucose readings: fasting glucose: <170, post prandial glucose: n/a Denies/reports hypoglycemic/hyperglycemic symptoms Current meal patterns: breakfast:  Discussed meal planning options and Plate method for healthy  eating Avoid sugary drinks and desserts Incorporate balanced protein, non starchy veggies, 1 serving of carbohydrate with each meal Increase water intake Increase physical activity as able Current exercise: n/a Counseled on rybelsus, dietary changes Assessed patient finances. Called novo nordisk-patient approved for 2022-rybelsus to ship to PCP office in 10-14 business days ; sample given to bridge patient to PAP supply  Patient Goals/Self-Care Activities Over Philip next 90 days, patient will:  - take medications as prescribed check glucose daily (fasting), document, and provide at future appointments  Follow Up Plan: Telephone follow up appointment with care management team member scheduled for: 1 month      Medication Assistance:  [place]ELSUS [place].  Enrollment ends 03/18/2021  [place]  [facility] [place] [address] [place] [address] [place]Phone: [phone]: [phone]  Follow Up:  Patient agrees to Care Plan and Follow-up.  Plan: Telephone follow up appointment with care management team member scheduled for:  1 MONTH  JRegina Eck PharmD, BCPS Clinical Pharmacist, [place] Phone [phone]

## 2020-10-24 ENCOUNTER — Telehealth: Payer: Self-pay | Admitting: *Deleted

## 2020-10-24 NOTE — Telephone Encounter (Signed)
Pt assistance RYBELSUS 7 mg came in #4 boxes up front ready for pick up = pt aware

## 2020-11-07 ENCOUNTER — Other Ambulatory Visit: Payer: Self-pay

## 2020-11-07 ENCOUNTER — Encounter: Payer: Self-pay | Admitting: Family Medicine

## 2020-11-07 ENCOUNTER — Ambulatory Visit (INDEPENDENT_AMBULATORY_CARE_PROVIDER_SITE_OTHER): Payer: Medicare HMO | Admitting: Family Medicine

## 2020-11-07 VITALS — BP 172/94 | HR 111 | Temp 97.1°F | Ht 69.0 in | Wt 199.6 lb

## 2020-11-07 DIAGNOSIS — S76312A Strain of muscle, fascia and tendon of the posterior muscle group at thigh level, left thigh, initial encounter: Secondary | ICD-10-CM

## 2020-11-07 MED ORDER — METHOCARBAMOL 500 MG PO TABS: 500.0000 mg | ORAL_TABLET | Freq: Three times a day (TID) | ORAL | 1 refills | Status: DC | PRN

## 2020-11-07 MED ORDER — HYDROCODONE-ACETAMINOPHEN 5-325 MG PO TABS: 1.0000 | ORAL_TABLET | Freq: Four times a day (QID) | ORAL | 0 refills | Status: AC | PRN

## 2020-11-07 NOTE — Progress Notes (Signed)
Assessment & Plan:  1. Strain of left hamstring muscle, initial encounter Education provided on hamstring strain. Discussed with patient he should only take Norco when the pain is really bad. Trial of Robaxin since he did not find Baclofen helpful last time.  - HYDROcodone-acetaminophen (NORCO) 5-325 MG tablet; Take 1 tablet by mouth every 6 (six) hours as needed for up to 5 days for moderate pain.  Dispense: 20 tablet; Refill: 0 - methocarbamol (ROBAXIN) 500 MG tablet; Take 1 tablet (500 mg total) by mouth every 8 (eight) hours as needed for muscle spasms.  Dispense: 30 tablet; Refill: 1   Follow up plan: Return if symptoms worsen or fail to improve.  Hendricks Limes, MSN, APRN, FNP-C [facility]  Subjective:   Patient ID: Philip [name], male    DOB: August 05, 1950, 70 y.o.   MRN: [id number]  HPI: Philip [name] is a 70 y.o. male presenting on 11/07/2020 for pulled hamstring (Left leg x 1 month )  Patient reports he pulled his left hamstring this morning getting up from breakfast. He states this is the third time he has pulled this hamstring this year. The first time wasn't enough to come to the doctor about. The second time he was seen and prescribed Baclofen which he reports was not at all helpful. He is very active and does a lot of physical activity, although he states he has been taking it easy on his legs and not doing those workouts for the past month.    ROS: Negative unless specifically indicated above in HPI.   Relevant past medical history reviewed and updated as indicated.   Allergies and medications reviewed and updated.   Current Outpatient Medications:    aspirin EC 81 MG tablet, Take 81 mg by mouth in the morning and at bedtime. , Disp: , Rfl:    atorvastatin (LIPITOR) 40 MG tablet, Take 1 tablet (40 mg total) by mouth daily., Disp: 90 tablet, Rfl: 3   lisinopril (ZESTRIL) 20 MG tablet, Take 1 tablet (20 mg total) by mouth daily., Disp: 90 tablet, Rfl:  3   metFORMIN (GLUCOPHAGE) 1000 MG tablet, Take 1 tablet (1,000 mg total) by mouth 2 (two) times daily with a meal., Disp: 180 tablet, Rfl: 3   Semaglutide (RYBELSUS) 7 MG TABS, Take 7 mg by mouth daily., Disp: 90 tablet, Rfl: 3   Vitamin D, Ergocalciferol, (DRISDOL) 1.25 MG (50000 UNIT) CAPS capsule, Take 1 capsule (50,000 Units total) by mouth every 7 (seven) days., Disp: 12 capsule, Rfl: 3  Allergies  Allergen Reactions   Ativan [Lorazepam] Other (See Comments)    Caused patient not to remember several days.   Blueberry [Vaccinium Angustifolium] Swelling   Fish Oil    Glucosamine Forte [Nutritional Supplements]     Weak, flu like symptoms   Shellfish Allergy    Ultram [Tramadol Hcl] Hives    Objective:   BP (!) 172/94   Pulse (!) 111   Temp (!) 97.1 F (36.2 C) (Temporal)   Ht '5\' 9"'$  (1.753 m)   Wt 199 lb 9.6 oz (90.5 kg)   BMI 29.48 kg/m    Physical Exam Vitals reviewed.  Constitutional:      General: He is not in acute distress.    Appearance: Normal appearance. He is not ill-appearing, toxic-appearing or diaphoretic.  HENT:     Head: Normocephalic and atraumatic.  Eyes:     General: No scleral icterus.       Right eye: No discharge.  Left eye: No discharge.     Conjunctiva/sclera: Conjunctivae normal.  Cardiovascular:     Rate and Rhythm: Normal rate.  Pulmonary:     Effort: Pulmonary effort is normal. No respiratory distress.  Musculoskeletal:        General: Normal range of motion.     Cervical back: Normal range of motion.     Left upper leg: Tenderness (hamstring) present. No swelling, edema, deformity, lacerations or bony tenderness.  Skin:    General: Skin is warm and dry.  Neurological:     Mental Status: He is alert and oriented to person, place, and time. Mental status is at baseline.  Psychiatric:        Mood and Affect: Mood normal.        Behavior: Behavior normal.        Thought Content: Thought content normal.        Judgment: Judgment  normal.

## 2020-11-26 ENCOUNTER — Ambulatory Visit (INDEPENDENT_AMBULATORY_CARE_PROVIDER_SITE_OTHER): Payer: Medicare HMO | Admitting: Pharmacist

## 2020-11-26 DIAGNOSIS — E119 Type 2 diabetes mellitus without complications: Secondary | ICD-10-CM | POA: Diagnosis not present

## 2020-11-26 NOTE — Patient Instructions (Signed)
Visit Information  PATIENT GOALS:  Goals Addressed               This Visit's Progress     Patient Stated     T2DM (pt-stated)        Current Barriers:  Unable to independently afford treatment regimen Unable to achieve control of T2DM ; mostly due to cost and diet/lifestyle  Pharmacist Clinical Goal(s):  Over the next 90 days, patient will verbalize ability to afford treatment regimen achieve adherence to monitoring guidelines and medication adherence to achieve therapeutic efficacy achieve control of t2dm as evidenced by goal A1c<7% through collaboration with PharmD and provider.   Interventions: 1:1 collaboration with Dettinger, Fransisca Kaufmann, MD regarding development and update of comprehensive plan of care as evidenced by provider attestation and co-signature Inter-disciplinary care team collaboration (see longitudinal plan of care) Comprehensive medication review performed; medication list updated in electronic medical record  Diabetes: Controlled; current treatment: rybelsus '7mg'$ ; metformin GFR 86 On statin, on ACEi Denies personal and family history of Medullary thyroid cancer ([place]) Novonordisk patient assistance supply shipped to pcp office on 10/24/20 (4 month supply); will reach out in October for refills and re-enrollment--patient stable Continue current therapy Current glucose readings: fasting glucose: <150, post prandial glucose: n/a Denies/reports hypoglycemic/hyperglycemic symptoms Current meal patterns: breakfast:  Discussed meal planning options and Plate method for healthy eating Avoid sugary drinks and desserts Incorporate balanced protein, non starchy veggies, 1 serving of carbohydrate with each meal Increase water intake Increase physical activity as able Current exercise: n/a Counseled on rybelsus, dietary changes Assessed patient finances. Patient enrolled in novo nordisk patient assistance program until 03/18/21--will re-enroll in November; patient picked  up last supply  Patient Goals/Self-Care Activities Over the next 90 days, patient will:  - take medications as prescribed check glucose daily (fasting), document, and provide at future appointments  Follow Up Plan: Telephone follow up appointment with care management team member scheduled for: October 2022         The patient verbalized understanding of instructions, educational materials, and care plan provided today and declined offer to receive copy of patient instructions, educational materials, and care plan.   Telephone follow up appointment with care management team member scheduled for: 01/2021  Signature Regina Eck, PharmD, BCPS Clinical Pharmacist, [place]  [facility] Phone [phone]

## 2020-11-26 NOTE — Progress Notes (Signed)
Chronic Care Management Pharmacy Note  11/26/2020 Name:  Philip [name] MRN:  [id number] DOB:  02-May-1950  Summary: diabetes management   Recommendations/Changes made from today's visit: Diabetes: Controlled; current treatment: rybelsus 47m; metformin GFR 86 On statin, on ACEi Denies personal and [place] history of [place] Novonordisk patient assistance supply shipped to pcp office on 10/24/20 (4 month supply); will reach out in October for refills and re-enrollment--patient stable Continue current therapy Current glucose readings: fasting glucose: <150, post prandial glucose: n/a Denies/reports hypoglycemic/hyperglycemic symptoms Current meal patterns: breakfast:  Discussed meal planning options and Plate method for healthy eating Avoid sugary drinks and desserts Incorporate balanced protein, non starchy veggies, 1 serving of carbohydrate with each meal Increase water intake Increase physical activity as able Current exercise: n/a Counseled on rybelsus, dietary changes Assessed patient finances. Patient enrolled in novo nordisk patient assistance program until 03/18/21--will re-enroll in November; patient picked up last supply  Follow Up Plan: Telephone follow up appointment with care management team member scheduled for: October 2022  Subjective: Philip Weidingeris an 70y.o. year old male who is a primary patient of Dettinger, JFransisca Kaufmann MD.  The CCM team was consulted for assistance with disease management and care coordination needs.    Engaged with patient by telephone for follow up visit in response to provider referral for pharmacy case management and/or care coordination services.   Consent to Services:  The patient was given information about Chronic Care Management services, agreed to services, and gave verbal consent prior to initiation of services.  Please see initial visit note for detailed documentation.   Patient Care Team: Dettinger, JFransisca Kaufmann  MD as PCP - General (Family Medicine) [facility]vera [facility]ise [facility])   Objective:  Lab Results  Component Value Date   CREATININE 0.96 06/26/2020   CREATININE 1.04 12/26/2019   CREATININE 0.83 09/21/2019    Lab Results  Component Value Date   HGBA1C 6.7 09/26/2020   Last diabetic Eye exam:  Lab Results  Component Value Date/Time   HMDIABEYEEXA No Retinopathy 09/12/2020 12:00 AM    Last diabetic Foot exam: No results found for: HMDIABFOOTEX      Component Value Date/Time   CHOL 169 06/26/2020 0840   TRIG 195 (H) 06/26/2020 0840   TRIG 232 (H) 05/08/2013 1200   HDL 45 06/26/2020 0840   HDL 52 05/08/2013 1200   CHOLHDL 3.8 06/26/2020 0840   CHOLHDL 6.0 08/14/2019 0927   VLDL 74 (H) 08/14/2019 0927   LDLCALC 91 06/26/2020 0840   LDLCALC 200 (H) 05/08/2013 1200    Hepatic Function Latest Ref Rng & Units 06/26/2020 12/26/2019 09/21/2019  Total Protein 6.0 - 8.5 g/dL 6.9 6.7 6.8  Albumin 3.8 - 4.8 g/dL 4.5 4.5 4.6  AST 0 - 40 IU/L 17 21 15   ALT 0 - 44 IU/L 15 21 15   Alk Phosphatase 44 - 121 IU/L 98 84 89  Total Bilirubin 0.0 - 1.2 mg/dL 0.4 0.5 0.4  Bilirubin, Direct 0.00 - 0.40 mg/dL - - -    Lab Results  Component Value Date/Time   TSH 1.270 06/26/2020 08:40 AM   TSH 1.030 12/26/2019 09:23 AM    CBC Latest Ref Rng & Units 06/26/2020 12/26/2019 08/13/2019  WBC 3.4 - 10.8 x10E3/uL 8.6 11.3(H) -  Hemoglobin 13.0 - 17.7 g/dL 15.3 15.2 16.0  Hematocrit 37.5 - 51.0 % 46.0 43.7 47.0  Platelets 150 - 450 x10E3/uL 357 351 -    Lab Results  Component  Value Date/Time   VD25OH 13.2 (L) 10/23/2018 08:39 AM   VD25OH 18.2 (L) 08/16/2016 02:41 PM    Clinical ASCVD: No  The 10-year ASCVD risk score Mikey Bussing DC Jr., et al., 2013) is: 57.9%   Values used to calculate the score:     Age: 70 years     Sex: Male     Is Non-Hispanic African American: No     Diabetic: Yes     Tobacco smoker: Yes     Systolic Blood Pressure: 341 mmHg     Is BP treated: Yes     HDL Cholesterol:  45 mg/dL     Total Cholesterol: 169 mg/dL    Other: (CHADS2VASc if Afib, PHQ9 if depression, MMRC or CAT for COPD, ACT, DEXA)  Social History   Tobacco Use  Smoking Status Some Days   Packs/day: 0.50   Years: 53.00   Pack years: 26.50   Types: Cigarettes  Smokeless Tobacco Never   BP Readings from Last 3 Encounters:  11/07/20 (!) 172/94  09/26/20 (!) 165/91  06/26/20 129/79   Pulse Readings from Last 3 Encounters:  11/07/20 (!) 111  09/26/20 (!) 101  06/26/20 (!) 113   Wt Readings from Last 3 Encounters:  11/07/20 199 lb 9.6 oz (90.5 kg)  09/26/20 199 lb (90.3 kg)  06/26/20 194 lb (88 kg)    Assessment: Review of patient past medical history, allergies, medications, health status, including review of consultants reports, laboratory and other test data, was performed as part of comprehensive evaluation and provision of chronic care management services.   SDOH:  (Social Determinants of Health) assessments and interventions performed:    CCM Care Plan  Allergies  Allergen Reactions   Ativan [Lorazepam] Other (See Comments)    Caused patient not to remember several days.   Blueberry [Vaccinium Angustifolium] Swelling   Fish Oil    Glucosamine Forte [Nutritional Supplements]     Weak, flu like symptoms   Shellfish Allergy    Ultram [Tramadol Hcl] Hives    Medications Reviewed Today     Reviewed by Loman Brooklyn, FNP (Family Nurse Practitioner) on 11/07/20 at 1413  Med List Status: <None>   Medication Order Taking? Sig Documenting Provider Last Dose Status Informant  aspirin EC 81 MG tablet [id number] Yes Take 81 mg by mouth in the morning and at bedtime.  [provider] Taking Active Self  atorvastatin (LIPITOR) 40 MG tablet [id number] Yes Take 1 tablet (40 mg total) by mouth daily. Dettinger, Fransisca Kaufmann, MD Taking Active   lisinopril (ZESTRIL) 20 MG tablet [id number] Yes Take 1 tablet (20 mg total) by mouth daily. Dettinger, Fransisca Kaufmann, MD Taking Active    metFORMIN (GLUCOPHAGE) 1000 MG tablet [id number] Yes Take 1 tablet (1,000 mg total) by mouth 2 (two) times daily with a meal. Dettinger, Fransisca Kaufmann, MD Taking Active   Semaglutide (RYBELSUS) 7 MG TABS [id number] Yes Take 7 mg by mouth daily. Dettinger, Fransisca Kaufmann, MD Taking Active   Vitamin D, Ergocalciferol, (DRISDOL) 1.25 MG (50000 UNIT) CAPS capsule [id number] Yes Take 1 capsule (50,000 Units total) by mouth every 7 (seven) days. Dettinger, Fransisca Kaufmann, MD Taking Active             Patient Active Problem List   Diagnosis Date Noted   Status post CVA 08/14/2019   Numbness 08/13/2019   Ataxia 08/13/2019   S/P lumbar microdiscectomy 08/01/2014   PTSD (post-traumatic stress disorder) 07/23/2014   Hyperlipidemia    Vitamin D deficiency  08/20/2013   Hypercalcemia 06/19/2013   Diabetes ([place]) 10/30/2012   Non compliance with medical treatment 10/30/2012   Panic disorder 10/30/2012   Hypertension 09/24/2010     There is no immunization history on file for this patient.  Conditions to be addressed/monitored: DMII  Care Plan : PHARMD MEDICATION MANAGEMENT  Updates made by Lavera Guise, RPH since 11/26/2020 12:00 AM     Problem: DISEASE PROGRESSION PREVENTION   Priority: High     Long-Range Goal: T2DM   Recent Progress: On track  Priority: High  Note:   Current Barriers:  Unable to independently afford treatment regimen Unable to achieve control of T2DM ; mostly due to cost and diet/lifestyle  Pharmacist Clinical Goal(s):  Over the next 90 days, patient will verbalize ability to afford treatment regimen achieve adherence to monitoring guidelines and medication adherence to achieve therapeutic efficacy achieve control of t2dm as evidenced by goal A1c<7%  through collaboration with PharmD and provider.   Interventions: 1:1 collaboration with Dettinger, Fransisca Kaufmann, MD regarding development and update of comprehensive plan of care as evidenced by provider attestation and  co-signature Inter-disciplinary care team collaboration (see longitudinal plan of care) Comprehensive medication review performed; medication list updated in electronic medical record  Diabetes: Controlled; current treatment: rybelsus 86m; metformin GFR 86 On statin, on ACEi Denies personal and [place] history of [place] Novonordisk patient assistance supply shipped to pcp office on 10/24/20 (4 month supply); will reach out in October for refills and re-enrollment--patient stable Continue current therapy Current glucose readings: fasting glucose: <150, post prandial glucose: n/a Denies/reports hypoglycemic/hyperglycemic symptoms Current meal patterns: breakfast:  Discussed meal planning options and Plate method for healthy eating Avoid sugary drinks and desserts Incorporate balanced protein, non starchy veggies, 1 serving of carbohydrate with each meal Increase water intake Increase physical activity as able Current exercise: n/a Counseled on rybelsus, dietary changes Assessed patient finances. Patient enrolled in novo nordisk patient assistance program until 03/18/21--will re-enroll in November ; patient picked up last supply  Patient Goals/Self-Care Activities Over the next 90 days, patient will:  - take medications as prescribed check glucose daily (fasting), document, and provide at future appointments  Follow Up Plan: Telephone follow up appointment with care management team member scheduled for: October 2022      Medication Assistance:  rybelsus obtained through novo nordisk medication assistance program.  Enrollment ends 04/18/21--last refill due to company by 03/18/21.  Will have to re-enroll for next year.  Medication must ship to PCP office per company rules  [facility] [place] [address] [id number]: [phone]: [phone]  Follow Up:   Patient agrees to Care Plan and Follow-up.  Plan: Telephone follow up appointment with care management team member scheduled for:  01/2021  JRegina Eck PharmD, BCPS Clinical Pharmacist, [place] Phone [phone]

## 2020-12-31 ENCOUNTER — Encounter: Payer: Self-pay | Admitting: Family Medicine

## 2020-12-31 ENCOUNTER — Other Ambulatory Visit: Payer: Self-pay

## 2020-12-31 ENCOUNTER — Ambulatory Visit (INDEPENDENT_AMBULATORY_CARE_PROVIDER_SITE_OTHER): Payer: Medicare HMO | Admitting: Family Medicine

## 2020-12-31 VITALS — BP 153/73 | HR 98 | Ht 69.0 in | Wt 199.0 lb

## 2020-12-31 DIAGNOSIS — E785 Hyperlipidemia, unspecified: Secondary | ICD-10-CM

## 2020-12-31 DIAGNOSIS — E119 Type 2 diabetes mellitus without complications: Secondary | ICD-10-CM

## 2020-12-31 DIAGNOSIS — N529 Male erectile dysfunction, unspecified: Secondary | ICD-10-CM | POA: Diagnosis not present

## 2020-12-31 DIAGNOSIS — E782 Mixed hyperlipidemia: Secondary | ICD-10-CM

## 2020-12-31 DIAGNOSIS — I10 Essential (primary) hypertension: Secondary | ICD-10-CM | POA: Diagnosis not present

## 2020-12-31 LAB — BAYER DCA HB A1C WAIVED: HB A1C (BAYER DCA - WAIVED): 6.7 % — ABNORMAL HIGH (ref 4.8–5.6)

## 2020-12-31 MED ORDER — LISINOPRIL 20 MG PO TABS: 20.0000 mg | ORAL_TABLET | Freq: Every day | ORAL | 3 refills | Status: DC

## 2020-12-31 MED ORDER — METFORMIN HCL 1000 MG PO TABS: 1000.0000 mg | ORAL_TABLET | Freq: Two times a day (BID) | ORAL | 3 refills | Status: DC

## 2020-12-31 MED ORDER — ATORVASTATIN CALCIUM 40 MG PO TABS: 40.0000 mg | ORAL_TABLET | Freq: Every day | ORAL | 3 refills | Status: DC

## 2020-12-31 MED ORDER — SILDENAFIL CITRATE 20 MG PO TABS: 20.0000 mg | ORAL_TABLET | ORAL | 1 refills | Status: DC | PRN

## 2020-12-31 MED ORDER — RYBELSUS 7 MG PO TABS: 7.0000 mg | ORAL_TABLET | Freq: Every day | ORAL | 3 refills | Status: DC

## 2020-12-31 NOTE — Progress Notes (Signed)
BP (!) 153/73   Pulse 98   Ht 5' 9"  (1.753 m)   Wt 199 lb (90.3 kg)   SpO2 98%   BMI 29.39 kg/m    Subjective:   Patient ID: Philip [name], male    DOB: 1951/03/30, 70 y.o.   MRN: [id number]  HPI: Philip [name] is a 70 y.o. male presenting on 12/31/2020 for Medical Management of Chronic Issues, Hypertension, Hyperlipidemia, and Diabetes   HPI Type 2 diabetes mellitus Patient comes in today for recheck of his diabetes. Patient has been currently taking metformin and Rybelsus. Patient is currently on an ACE inhibitor/ARB. Patient has not seen an ophthalmologist this year. Patient denies any issues with their feet. The symptom started onset as an adult hypertension and hyperlipidemia ARE RELATED TO DM   Hypertension Patient is currently on lisinopril, and their blood pressure today is 174/92, recheck 153/73. Patient denies any lightheadedness or dizziness. Patient denies headaches, blurred vision, chest pains, shortness of breath, or weakness. Denies any side effects from medication and is content with current medication.   Hyperlipidemia Patient is coming in for recheck of his hyperlipidemia. The patient is currently taking Lipitor. They deny any issues with myalgias or history of liver damage from it. They deny any focal numbness or weakness or chest pain.   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  Patient has a new partner over the past few months and is sexually active and would like to try some Viagra to help because he does have some erectile dysfunction.  Relevant past medical, surgical, family and social history reviewed and updated as indicated. Interim medical history since our last visit reviewed. Allergies and medications reviewed and updated.  Review of Systems  Constitutional:  Negative for chills and fever.  Eyes:  Negative for visual disturbance.  Respiratory:  Negative  for shortness of breath and wheezing.   Cardiovascular:  Negative for chest pain and leg swelling.  Musculoskeletal:  Positive for arthralgias. Negative for back pain and gait problem.  Skin:  Negative for rash.  All other systems reviewed and are negative.  Per HPI unless specifically indicated above   Allergies as of 12/31/2020       Reactions   Ativan [lorazepam] Other (See Comments)   Caused patient not to remember several days.   Blueberry [vaccinium Angustifolium] Swelling   Fish Oil    Glucosamine Forte [nutritional Supplements]    Weak, flu like symptoms   Shellfish Allergy    Ultram [tramadol Hcl] Hives        Medication List        Accurate as of December 31, 2020  8:38 AM. If you have any questions, ask your nurse or doctor.          aspirin EC 81 MG tablet Take 81 mg by mouth in the morning and at bedtime.   atorvastatin 40 MG tablet Commonly known as: LIPITOR Take 1 tablet (40 mg total) by mouth daily.   lisinopril 20 MG tablet Commonly known as: ZESTRIL Take 1 tablet (20 mg total) by mouth daily.   metFORMIN 1000 MG tablet Commonly known as: GLUCOPHAGE Take 1 tablet (1,000 mg total) by mouth 2 (two) times daily with a meal.   methocarbamol 500 MG tablet Commonly known as: Robaxin Take 1 tablet (500 mg total) by mouth every 8 (eight) hours as needed for muscle spasms.  Rybelsus 7 MG Tabs Generic drug: Semaglutide Take 7 mg by mouth daily.   sildenafil 20 MG tablet Commonly known as: REVATIO Take 1-4 tablets (20-80 mg total) by mouth as needed. Started by: Worthy Rancher, MD   Vitamin D (Ergocalciferol) 1.25 MG (50000 UNIT) Caps capsule Commonly known as: DRISDOL Take 1 capsule (50,000 Units total) by mouth every 7 (seven) days.         Objective:   BP (!) 153/73   Pulse 98   Ht 5' 9"  (1.753 m)   Wt 199 lb (90.3 kg)   SpO2 98%   BMI 29.39 kg/m   Wt Readings from Last 3 Encounters:  12/31/20 199 lb (90.3 kg)  11/07/20  199 lb 9.6 oz (90.5 kg)  09/26/20 199 lb (90.3 kg)    Physical Exam Vitals and nursing note reviewed.  Constitutional:      General: He is not in acute distress.    Appearance: He is well-developed. He is not diaphoretic.  Eyes:     General: No scleral icterus.    Conjunctiva/sclera: Conjunctivae normal.  Neck:     Thyroid: No thyromegaly.  Cardiovascular:     Rate and Rhythm: Normal rate and regular rhythm.     Heart sounds: Normal heart sounds. No murmur heard. Pulmonary:     Effort: Pulmonary effort is normal. No respiratory distress.     Breath sounds: Normal breath sounds. No wheezing.  Musculoskeletal:        General: Normal range of motion.     Cervical back: Neck supple.  Lymphadenopathy:     Cervical: No cervical adenopathy.  Skin:    General: Skin is warm and dry.     Findings: No rash.  Neurological:     Mental Status: He is alert and oriented to person, place, and time.     Coordination: Coordination normal.  Psychiatric:        Behavior: Behavior normal.      Assessment & Plan:   Problem List Items Addressed This Visit       Cardiovascular and Mediastinum   Hypertension   Relevant Medications   atorvastatin (LIPITOR) 40 MG tablet   lisinopril (ZESTRIL) 20 MG tablet   sildenafil (REVATIO) 20 MG tablet   Other Relevant Orders   CBC with Differential/Platelet   CMP14+EGFR   Lipid panel   TSH   Bayer DCA Hb A1c Waived     Endocrine   Diabetes ([place]) - Primary   Relevant Medications   atorvastatin (LIPITOR) 40 MG tablet   lisinopril (ZESTRIL) 20 MG tablet   metFORMIN (GLUCOPHAGE) 1000 MG tablet   Semaglutide (RYBELSUS) 7 MG TABS   Other Relevant Orders   CBC with Differential/Platelet   CMP14+EGFR   Lipid panel   TSH   Bayer DCA Hb A1c Waived     Other   Hyperlipidemia   Relevant Medications   atorvastatin (LIPITOR) 40 MG tablet   lisinopril (ZESTRIL) 20 MG tablet   sildenafil (REVATIO) 20 MG tablet   Other Relevant Orders   CBC with  Differential/Platelet   CMP14+EGFR   Lipid panel   TSH   Bayer DCA Hb A1c Waived   Other Visit Diagnoses     Essential hypertension       Relevant Medications   atorvastatin (LIPITOR) 40 MG tablet   lisinopril (ZESTRIL) 20 MG tablet   sildenafil (REVATIO) 20 MG tablet   Erectile dysfunction, unspecified erectile dysfunction type  Will check blood work, A1c pending  Blood pressure elevated, patient will keep close track on it and send [place] some numbers over the next few weeks.   Follow up plan: Return in about 3 months (around 04/01/2021), or if symptoms worsen or fail to improve, for Diabetes and hypertension and cholesterol.  Counseling provided for all of the vaccine components Orders Placed This Encounter  Procedures   CBC with Differential/Platelet   CMP14+EGFR   Lipid panel   TSH   Bayer DCA Hb A1c [place], MD [facility] 12/31/2020, 8:38 AM

## 2021-01-01 LAB — CBC WITH DIFFERENTIAL/PLATELET
Basophils Absolute: 0.1 10*3/uL (ref 0.0–0.2)
Basos: 1 %
EOS (ABSOLUTE): 0.2 10*3/uL (ref 0.0–0.4)
Eos: 2 %
Hematocrit: 42.9 % (ref 37.5–51.0)
Hemoglobin: 14.9 g/dL (ref 13.0–17.7)
Immature Grans (Abs): 0 10*3/uL (ref 0.0–0.1)
Immature Granulocytes: 0 %
Lymphocytes Absolute: 2.8 10*3/uL (ref 0.7–3.1)
Lymphs: 32 %
MCH: 31.6 pg (ref 26.6–33.0)
MCHC: 34.7 g/dL (ref 31.5–35.7)
MCV: 91 fL (ref 79–97)
Monocytes Absolute: 0.7 10*3/uL (ref 0.1–0.9)
Monocytes: 8 %
Neutrophils Absolute: 5 10*3/uL (ref 1.4–7.0)
Neutrophils: 57 %
Platelets: 328 10*3/uL (ref 150–450)
RBC: 4.72 x10E6/uL (ref 4.14–5.80)
RDW: 12.3 % (ref 11.6–15.4)
WBC: 8.8 10*3/uL (ref 3.4–10.8)

## 2021-01-01 LAB — CMP14+EGFR
ALT: 15 IU/L (ref 0–44)
AST: 17 IU/L (ref 0–40)
Albumin/Globulin Ratio: 1.9 (ref 1.2–2.2)
Albumin: 4.7 g/dL (ref 3.8–4.8)
Alkaline Phosphatase: 97 IU/L (ref 44–121)
BUN/Creatinine Ratio: 13 (ref 10–24)
BUN: 12 mg/dL (ref 8–27)
Bilirubin Total: 0.6 mg/dL (ref 0.0–1.2)
CO2: 22 mmol/L (ref 20–29)
Calcium: 11.5 mg/dL — ABNORMAL HIGH (ref 8.6–10.2)
Chloride: 104 mmol/L (ref 96–106)
Creatinine, Ser: 0.91 mg/dL (ref 0.76–1.27)
Globulin, Total: 2.5 g/dL (ref 1.5–4.5)
Glucose: 161 mg/dL — ABNORMAL HIGH (ref 65–99)
Potassium: 4.2 mmol/L (ref 3.5–5.2)
Sodium: 142 mmol/L (ref 134–144)
Total Protein: 7.2 g/dL (ref 6.0–8.5)
eGFR: 91 mL/min/{1.73_m2} (ref >59)

## 2021-01-01 LAB — LIPID PANEL
Chol/HDL Ratio: 3.8 ratio (ref 0.0–5.0)
Cholesterol, Total: 162 mg/dL (ref 100–199)
HDL: 43 mg/dL (ref >39)
LDL Chol Calc (NIH): 93 mg/dL (ref 0–99)
Triglycerides: 145 mg/dL (ref 0–149)
VLDL Cholesterol Cal: 26 mg/dL (ref 5–40)

## 2021-01-01 LAB — TSH: TSH: 1.21 u[IU]/mL (ref 0.450–4.500)

## 2021-01-15 ENCOUNTER — Telehealth: Payer: Self-pay | Admitting: Pharmacist

## 2021-01-15 NOTE — Telephone Encounter (Signed)
RYBELSUS PATIENT ASSISTANCE SHIPMENT ARRIVED  4 MONTH SUPPLY --7MG  TABLE DAILY  LEFT UP FRONT WITH SAMPLES  THANKS! Kao Conry

## 2021-01-15 NOTE — Telephone Encounter (Signed)
Patient aware and verbalized understanding. °

## 2021-01-27 ENCOUNTER — Telehealth: Payer: Self-pay

## 2021-03-26 ENCOUNTER — Telehealth: Payer: Medicare HMO

## 2021-04-01 ENCOUNTER — Ambulatory Visit (INDEPENDENT_AMBULATORY_CARE_PROVIDER_SITE_OTHER): Payer: Medicare HMO

## 2021-04-01 ENCOUNTER — Ambulatory Visit (INDEPENDENT_AMBULATORY_CARE_PROVIDER_SITE_OTHER): Payer: Medicare HMO | Admitting: Family Medicine

## 2021-04-01 ENCOUNTER — Encounter: Payer: Self-pay | Admitting: Family Medicine

## 2021-04-01 VITALS — BP 164/88 | HR 119 | Ht 69.0 in | Wt 188.0 lb

## 2021-04-01 DIAGNOSIS — E559 Vitamin D deficiency, unspecified: Secondary | ICD-10-CM

## 2021-04-01 DIAGNOSIS — M5410 Radiculopathy, site unspecified: Secondary | ICD-10-CM

## 2021-04-01 DIAGNOSIS — E782 Mixed hyperlipidemia: Secondary | ICD-10-CM

## 2021-04-01 DIAGNOSIS — I10 Essential (primary) hypertension: Secondary | ICD-10-CM | POA: Diagnosis not present

## 2021-04-01 DIAGNOSIS — E119 Type 2 diabetes mellitus without complications: Secondary | ICD-10-CM | POA: Diagnosis not present

## 2021-04-01 DIAGNOSIS — M47816 Spondylosis without myelopathy or radiculopathy, lumbar region: Secondary | ICD-10-CM | POA: Diagnosis not present

## 2021-04-01 DIAGNOSIS — M5136 Other intervertebral disc degeneration, lumbar region: Secondary | ICD-10-CM | POA: Diagnosis not present

## 2021-04-01 LAB — BAYER DCA HB A1C WAIVED: HB A1C (BAYER DCA - WAIVED): 7 % — ABNORMAL HIGH (ref 4.8–5.6)

## 2021-04-01 MED ORDER — VITAMIN D (ERGOCALCIFEROL) 1.25 MG (50000 UNIT) PO CAPS: 50000.0000 [IU] | ORAL_CAPSULE | ORAL | 3 refills | Status: DC

## 2021-04-01 MED ORDER — METHYLPREDNISOLONE ACETATE 80 MG/ML IJ SUSP
80.0000 mg | Freq: Once | INTRAMUSCULAR | Status: AC
  Administered 2021-04-01: 15:00:00 80 mg via INTRAMUSCULAR

## 2021-04-01 NOTE — Progress Notes (Signed)
BP (!) 164/88    Pulse (!) 119    Ht 5' 9"  (1.753 m)    Wt 188 lb (85.3 kg)    SpO2 99%    BMI 27.76 kg/m    Subjective:   Patient ID: Philip [name], male    DOB: Jul 30, 1950, 70 y.o.   MRN: [id number]  HPI: Philip [name] is a 70 y.o. male presenting on 04/01/2021 for Medical Management of Chronic Issues and Diabetes   HPI Type 2 diabetes mellitus Patient comes in today for recheck of his diabetes. Patient has been currently taking metformin. Patient is currently on an ACE inhibitor/ARB. Patient has seen an ophthalmologist this year. Patient denies any issues with their feet. The symptom started onset as an adult hypertension and hyperlipidemia ARE RELATED TO DM   Hypertension Patient is currently on lisinopril, and their blood pressure today is 164/88, has a lot of pain today. Patient denies any lightheadedness or dizziness. Patient denies headaches, blurred vision, chest pains, shortness of breath, or weakness. Denies any side effects from medication and is content with current medication.   Hyperlipidemia Patient is coming in for recheck of his hyperlipidemia. The patient is currently taking atorvastatin. They deny any issues with myalgias or history of liver damage from it. They deny any focal numbness or weakness or chest pain.   Left hamstring/lower leg pain. Patient is coming in for recheck for left lower leg pain and hamstring pain.  He says it is just not let up.  He has tried using the hydrocodone just at night and it does help him sleep but it does not help the situation get any better.  He says when he sits for any prolonged period of time more than 15 minutes he starts getting his severe pain going down the back of his hamstring on that left side and will occasionally get shooting pins and needle pain throughout his left lower leg.  He is also started to have some cramping and tightness palpated at times at his left calf as well.  This is been going on now since June 2022,  approximately 6 months.  He says is just really not getting better and the muscle relaxers and anti-inflammatories are just not helping as much.  He usually had been very active and did jujitsu but he has not been able to do that for the past 6 months.  Relevant past medical, surgical, family and social history reviewed and updated as indicated. Interim medical history since our last visit reviewed. Allergies and medications reviewed and updated.  Review of Systems  Constitutional:  Negative for chills and fever.  Respiratory:  Negative for shortness of breath and wheezing.   Cardiovascular:  Negative for chest pain and leg swelling.  Musculoskeletal:  Positive for arthralgias, gait problem and myalgias. Negative for back pain.  Skin:  Negative for rash.  All other systems reviewed and are negative.  Per HPI unless specifically indicated above   Allergies as of 04/01/2021       Reactions   Ativan [lorazepam] Other (See Comments)   Caused patient not to remember several days.   Blueberry [vaccinium Angustifolium] Swelling   Fish Oil    Glucosamine Forte [nutritional Supplements]    Weak, flu like symptoms   Shellfish Allergy    Ultram [tramadol Hcl] Hives        Medication List        Accurate as of April 01, 2021  1:44 PM. If you have any questions, ask  your nurse or doctor.          aspirin EC 81 MG tablet Take 81 mg by mouth in the morning and at bedtime.   atorvastatin 40 MG tablet Commonly known as: LIPITOR Take 1 tablet (40 mg total) by mouth daily.   lisinopril 20 MG tablet Commonly known as: ZESTRIL Take 1 tablet (20 mg total) by mouth daily.   metFORMIN 1000 MG tablet Commonly known as: GLUCOPHAGE Take 1 tablet (1,000 mg total) by mouth 2 (two) times daily with a meal.   methocarbamol 500 MG tablet Commonly known as: Robaxin Take 1 tablet (500 mg total) by mouth every 8 (eight) hours as needed for muscle spasms.   Rybelsus 7 MG Tabs Generic  drug: Semaglutide Take 7 mg by mouth daily.   sildenafil 20 MG tablet Commonly known as: REVATIO Take 1-4 tablets (20-80 mg total) by mouth as needed.   Vitamin D (Ergocalciferol) 1.25 MG (50000 UNIT) Caps capsule Commonly known as: DRISDOL Take 1 capsule (50,000 Units total) by mouth every 7 (seven) days.         Objective:   BP (!) 164/88    Pulse (!) 119    Ht 5' 9"  (1.753 m)    Wt 188 lb (85.3 kg)    SpO2 99%    BMI 27.76 kg/m   Wt Readings from Last 3 Encounters:  04/01/21 188 lb (85.3 kg)  12/31/20 199 lb (90.3 kg)  11/07/20 199 lb 9.6 oz (90.5 kg)    Physical Exam Vitals and nursing note reviewed.  Constitutional:      General: He is not in acute distress.    Appearance: He is well-developed. He is not diaphoretic.  Eyes:     General: No scleral icterus.       Right eye: No discharge.     Conjunctiva/sclera: Conjunctivae normal.  Neck:     Thyroid: No thyromegaly.  Cardiovascular:     Rate and Rhythm: Normal rate and regular rhythm.     Heart sounds: Normal heart sounds. No murmur heard. Pulmonary:     Effort: Pulmonary effort is normal. No respiratory distress.     Breath sounds: Normal breath sounds. No wheezing.  Musculoskeletal:     Cervical back: Neck supple.     Lumbar back: No tenderness (No point tenderness). Normal range of motion. Positive left straight leg raise test. Negative right straight leg raise test.     Right hip: Normal. No tenderness. Normal range of motion.     Left hip: Normal. No tenderness. Normal range of motion.     Right upper leg: Normal. No deformity, tenderness or bony tenderness.     Left upper leg: No deformity, tenderness or bony tenderness.  Lymphadenopathy:     Cervical: No cervical adenopathy.  Skin:    General: Skin is warm and dry.     Findings: No rash.  Neurological:     Mental Status: He is alert and oriented to person, place, and time.     Coordination: Coordination normal.  Psychiatric:        Behavior:  Behavior normal.      Assessment & Plan:   Problem List Items Addressed This Visit       Cardiovascular and Mediastinum   Hypertension     Endocrine   Diabetes ([place]) - Primary   Relevant Orders   BMP8+EGFR   VITAMIN D 25 Hydroxy (Vit-D Deficiency, Fractures)   Bayer DCA Hb A1c Waived  Other   Hypercalcemia   Vitamin D deficiency   Relevant Medications   Vitamin D, Ergocalciferol, (DRISDOL) 1.25 MG (50000 UNIT) CAPS capsule   Hyperlipidemia   Other Visit Diagnoses     Serum calcium elevated       Relevant Orders   BMP8+EGFR   VITAMIN D 25 Hydroxy (Vit-D Deficiency, Fractures)   Bayer DCA Hb A1c Waived   Radicular pain of left lower extremity       Relevant Medications   methylPREDNISolone acetate (DEPO-MEDROL) injection 80 mg (Start on 04/01/2021  1:45 PM)   Other Relevant Orders   DG Lumbar Spine 2-3 Views       Am concerned that his left posterior leg pain is not related to hamstring pull but may be neuropathic pain.  Concern for nerve impingement in this lower back  Patient has done stretches and exercises and therapy at home, normally he does jujitsu but he has been trying to stretch and exercise so he can get back to doing jujitsu.  This pain is kept him from doing it he is also done muscle relaxers and over-the-counter anti-inflammatories.  A1c 7.0, no medication change reduced refocus on diet.  Lumbar x-ray: Await final read from radiologist.  Consider MRI in the future but will do x-ray first and wait for results Follow up plan: Return in about 3 months (around 06/30/2021), or if symptoms worsen or fail to improve, for Diabetes htn and hld.  Counseling provided for all of the vaccine components Orders Placed This Encounter  Procedures   DG Lumbar Spine 2-3 Views   BMP8+EGFR   VITAMIN D 25 Hydroxy (Vit-D Deficiency, Fractures)   Bayer DCA Hb A1c Waived    Caryl Pina, MD [facility] 04/01/2021, 1:44 PM

## 2021-04-02 LAB — BMP8+EGFR
BUN/Creatinine Ratio: 8 — ABNORMAL LOW (ref 10–24)
BUN: 8 mg/dL (ref 8–27)
CO2: 25 mmol/L (ref 20–29)
Calcium: 11.6 mg/dL — ABNORMAL HIGH (ref 8.6–10.2)
Chloride: 103 mmol/L (ref 96–106)
Creatinine, Ser: 0.96 mg/dL (ref 0.76–1.27)
Glucose: 205 mg/dL — ABNORMAL HIGH (ref 70–99)
Potassium: 5 mmol/L (ref 3.5–5.2)
Sodium: 143 mmol/L (ref 134–144)
eGFR: 86 mL/min/{1.73_m2} (ref >59)

## 2021-04-02 LAB — VITAMIN D 25 HYDROXY (VIT D DEFICIENCY, FRACTURES): Vit D, 25-Hydroxy: 71.6 ng/mL (ref 30.0–100.0)

## 2021-04-03 ENCOUNTER — Ambulatory Visit (INDEPENDENT_AMBULATORY_CARE_PROVIDER_SITE_OTHER): Payer: Medicare HMO

## 2021-04-03 VITALS — Ht 69.0 in | Wt 188.0 lb

## 2021-04-03 DIAGNOSIS — Z0000 Encounter for general adult medical examination without abnormal findings: Secondary | ICD-10-CM | POA: Diagnosis not present

## 2021-04-03 DIAGNOSIS — Z1211 Encounter for screening for malignant neoplasm of colon: Secondary | ICD-10-CM | POA: Diagnosis not present

## 2021-04-03 NOTE — Progress Notes (Signed)
Subjective:   Philip [name] is a 70 y.o. male who presents for Medicare Annual/Subsequent preventive examination.  Virtual Visit via Telephone Note  I connected with  Philip [name] on 04/03/21 at  8:15 AM EST by telephone and verified that I am speaking with the correct person using two identifiers.  Location: Patient: Home Provider: [facility] Persons participating in the virtual visit: patient/Nurse Health Advisor   I discussed the limitations, risks, security and privacy concerns of performing an evaluation and management service by telephone and the availability of in person appointments. The patient expressed understanding and agreed to proceed.  Interactive audio and video telecommunications were attempted between this nurse and patient, however failed, due to patient having technical difficulties OR patient did not have access to video capability.  We continued and completed visit with audio only.  Some vital signs may be absent or patient reported.   Philip [name] E Cheryllynn Sarff, LPN   Review of Systems     Cardiac Risk Factors include: advanced age (>102men, >40 women);diabetes mellitus;male gender;dyslipidemia;hypertension;Other (see comment), Risk factor comments: hx of TIA     Objective:    Today's Vitals   04/03/21 0819  Weight: 188 lb (85.3 kg)  Height: 5\' 9"  (1.753 m)  PainSc: 4    Body mass index is 27.76 kg/m.  Advanced Directives 04/03/2021 04/01/2020 08/29/2019 08/14/2019 08/13/2019 07/27/2014 07/27/2014  Does Patient Have a Medical Advance Directive? No No No No No No No  Would patient like information on creating a medical advance directive? No - Patient declined No - Patient declined - No - Patient declined - No - patient declined information No - patient declined information    Current Medications (verified) Outpatient Encounter Medications as of 04/03/2021  Medication Sig   aspirin EC 81 MG tablet Take 81 mg by mouth in the morning and at bedtime.    atorvastatin (LIPITOR)  40 MG tablet Take 1 tablet (40 mg total) by mouth daily.   lisinopril (ZESTRIL) 20 MG tablet Take 1 tablet (20 mg total) by mouth daily.   metFORMIN (GLUCOPHAGE) 1000 MG tablet Take 1 tablet (1,000 mg total) by mouth 2 (two) times daily with a meal.   methocarbamol (ROBAXIN) 500 MG tablet Take 1 tablet (500 mg total) by mouth every 8 (eight) hours as needed for muscle spasms.   Semaglutide (RYBELSUS) 7 MG TABS Take 7 mg by mouth daily.   sildenafil (REVATIO) 20 MG tablet Take 1-4 tablets (20-80 mg total) by mouth as needed.   Vitamin D, Ergocalciferol, (DRISDOL) 1.25 MG (50000 UNIT) CAPS capsule Take 1 capsule (50,000 Units total) by mouth every 7 (seven) days.   No facility-administered encounter medications on file as of 04/03/2021.    Allergies (verified) Ativan [lorazepam], Blueberry [vaccinium angustifolium], Fish oil, Glucosamine forte [nutritional supplements], Shellfish allergy, and Ultram [tramadol hcl]   History: Past Medical History:  Diagnosis Date   Chest pain    Diabetes mellitus without complication ([place])    Hyperlipidemia    Hypertension    PTSD (post-traumatic stress disorder)    Past Surgical History:  Procedure Laterality Date   [place]   hernitated and ruptured discs   LUMBAR LAMINECTOMY/DECOMPRESSION MICRODISCECTOMY Right 08/01/2014   Procedure: Right L/4-5 Extraforaminal Diskectomy;  Surgeon: Eustace Moore, MD;  Location: [facility];  Service: Neurosurgery;  Laterality: Right;  Right L/4-5 Extraforaminal Diskectomy   TONSILLECTOMY     TONSILLECTOMY     Family History  Adopted: Yes   Social History  Socioeconomic History   Marital status: Single    Spouse name: Not on file   Number of children: Not on file   Years of education: senior college   Highest education level: Bachelor's degree (e.g., BA, AB, BS)  Occupational History   Occupation: Retired  Tobacco Use   Smoking status: Some Days    Packs/day: 0.50    Years: 53.00    Pack  years: 26.50    Types: Cigarettes   Smokeless tobacco: Never  Vaping Use   Vaping Use: Never used  Substance and Sexual Activity   Alcohol use: No   Drug use: No   Sexual activity: Not Currently  Other Topics Concern   Not on file  Social History Narrative   Not on file   Social Determinants of Health   Financial Resource Strain: Medium Risk   Difficulty of Paying Living Expenses: Somewhat hard  Food Insecurity: Food Insecurity Present   Worried About Running Out of Food in the Last Year: Sometimes true   Ran Out of Food in the Last Year: Not on file  Transportation Needs: No Transportation Needs   Lack of Transportation (Medical): No   Lack of Transportation (Non-Medical): No  Physical Activity: Sufficiently Active   Days of Exercise per Week: 3 days   Minutes of Exercise per Session: 60 min  Stress: Stress Concern Present   Feeling of Stress : Rather much  Social Connections: Socially Isolated   Frequency of Communication with Friends and Family: Never   Frequency of Social Gatherings with Friends and Family: Once a week   Attends Religious Services: Never   Marine scientist or Organizations: Yes   Attends Music therapist: More than 4 times per year   Marital Status: Separated    Tobacco Counseling Ready to quit: No Counseling given: Yes   Clinical Intake:  Pre-visit preparation completed: Yes  Pain : 0-10 Pain Score: 4  Pain Type: Chronic pain Pain Location: Leg Pain Orientation: Left Pain Descriptors / Indicators: Aching, Discomfort, Shooting, Sore Pain Onset: More than a month ago Pain Frequency: Intermittent     BMI - recorded: 27.76 Nutritional Status: BMI 25 -29 Overweight Nutritional Risks: None Diabetes: Yes CBG done?: No Did pt. bring in CBG monitor from home?: No  How often do you need to have someone help you when you read instructions, pamphlets, or other written materials from your doctor or pharmacy?: 1 -  Never  Diabetic? Nutrition Risk Assessment:  Has the patient had any N/V/D within the last 2 months?   Yes, but he had a stomach virus last week Does the patient have any non-healing wounds?  No  Has the patient had any unintentional weight loss or weight gain?  No   Diabetes:  Is the patient diabetic?  Yes  If diabetic, was a CBG obtained today?  No  Did the patient bring in their glucometer from home?  No  How often do you monitor your CBG's? none.   Financial Strains and Diabetes Management:  Are you having any financial strains with the device, your supplies or your medication? No .  Does the patient want to be seen by Chronic Care Management for management of their diabetes?  No  Would the patient like to be referred to a Nutritionist or for Diabetic Management?  No   Diabetic Exams:  Diabetic Eye Exam: Completed 09/12/2020.   Diabetic Foot Exam: Completed 12/31/2020. Pt has been advised about the importance in completing this  exam. Pt is scheduled for diabetic foot exam on next year.    Interpreter Needed?: No  Information entered by :: Kayelee Herbig, LPN   Activities of Daily Living In your present state of health, do you have any difficulty performing the following activities: 04/03/2021  Hearing? N  Vision? N  Difficulty concentrating or making decisions? Y  Walking or climbing stairs? Y  Comment has a hamstring injury right now, typically has no trouble  Dressing or bathing? N  Doing errands, shopping? N  Preparing Food and eating ? N  Using the Toilet? N  In the past six months, have you accidently leaked urine? N  Do you have problems with loss of bowel control? N  Managing your Medications? N  Managing your Finances? N  Housekeeping or managing your Housekeeping? N  Some recent data might be hidden    Patient Care Team: Dettinger, Fransisca Kaufmann, MD as PCP - General (Family Medicine) Lavera Guise, [facility] (Pharmacist)  Indicate any recent Medical Services you  may have received from other than [facility] providers in the past year (date may be approximate).     Assessment:   This is a routine wellness examination for [place].  Hearing/Vision screen No results found.  Dietary issues and exercise activities discussed: Current Exercise Habits: Structured exercise class, Type of exercise: strength training/weights;Other - see comments (boxing, fighting 3x per week in ring), Time (Minutes): 60, Frequency (Times/Week): 3, Weekly Exercise (Minutes/Week): 180, Intensity: Moderate, Exercise limited by: None identified   Goals Addressed               This Visit's Progress     Quit Smoking   Not on track     T2DM (pt-stated)   On track     Current Barriers:  Unable to independently afford treatment regimen Unable to achieve control of T2DM ; mostly due to cost and diet/lifestyle  Pharmacist Clinical Goal(s):  Over the next 90 days, patient will verbalize ability to afford treatment regimen achieve adherence to monitoring guidelines and medication adherence to achieve therapeutic efficacy achieve control of t2dm as evidenced by goal A1c<7% through collaboration with PharmD and provider.   Interventions: 1:1 collaboration with Dettinger, Fransisca Kaufmann, MD regarding development and update of comprehensive plan of care as evidenced by provider attestation and co-signature Inter-disciplinary care team collaboration (see longitudinal plan of care) Comprehensive medication review performed; medication list updated in electronic medical record  Diabetes: Controlled; current treatment: rybelsus 7mg ; metformin GFR 86 On statin, on ACEi Denies personal and family history of Medullary thyroid cancer ([place]) Novonordisk patient assistance supply shipped to pcp office on 10/24/20 (4 month supply); will reach out in October for refills and re-enrollment--patient stable Continue current therapy Current glucose readings: fasting glucose: <150, post prandial glucose:  n/a Denies/reports hypoglycemic/hyperglycemic symptoms Current meal patterns: breakfast:  Discussed meal planning options and Plate method for healthy eating Avoid sugary drinks and desserts Incorporate balanced protein, non starchy veggies, 1 serving of carbohydrate with each meal Increase water intake Increase physical activity as able Current exercise: n/a Counseled on rybelsus, dietary changes Assessed patient finances. Patient enrolled in novo nordisk patient assistance program until 03/18/21--will re-enroll in November; patient picked up last supply  Patient Goals/Self-Care Activities Over the next 90 days, patient will:  - take medications as prescribed check glucose daily (fasting), document, and provide at future appointments  Follow Up Plan: Telephone follow up appointment with care management team member scheduled for: October 2022  Depression Screen PHQ 2/9 Scores 04/03/2021 04/01/2021 12/31/2020 11/07/2020 09/26/2020 06/26/2020 04/01/2020  PHQ - 2 Score 2 2 2 1 2 1 1   PHQ- 9 Score 8 8 5 5  - - -    Fall Risk Fall Risk  04/03/2021 04/01/2021 12/31/2020 11/07/2020 09/26/2020  Falls in the past year? 1 1 1 1  0  Number falls in past yr: 0 0 1 1 -  Injury with Fall? 0 0 0 0 -  Risk for fall due to : Impaired balance/gait Impaired balance/gait Impaired balance/gait - -  Follow up Falls prevention discussed Falls evaluation completed Falls evaluation completed Falls prevention discussed -    FALL RISK PREVENTION PERTAINING TO THE HOME:  Any stairs in or around the home? Yes  If so, are there any without handrails? No  Home free of loose throw rugs in walkways, pet beds, electrical cords, etc? Yes  Adequate lighting in your home to reduce risk of falls? Yes   ASSISTIVE DEVICES UTILIZED TO PREVENT FALLS:  Life alert? No  Use of a cane, walker or w/c? No  Grab bars in the bathroom? No  Shower chair or bench in shower? No  Elevated toilet seat or a handicapped  toilet? No   TIMED UP AND GO:  Was the test performed? No . Telephonic visit  Cognitive Function: MMSE - Mini Mental State Exam 12/23/2017  Orientation to time 5  Orientation to Place 5  Registration 3  Attention/ Calculation 5  Recall 3  Language- name 2 objects 2  Language- repeat 1  Language- follow 3 step command 3  Language- read & follow direction 1  Write a sentence 1  Copy design 1  Total score 30     6CIT Screen 04/03/2021 04/01/2020  What Year? 0 points 0 points  What month? 0 points 0 points  What time? 0 points 0 points  Count back from 20 0 points 0 points  Months in reverse 0 points 0 points  Repeat phrase 0 points 0 points  Total Score 0 0    Immunizations  There is no immunization history on file for this patient.  TDAP status: Due, Education has been provided regarding the importance of this vaccine. Advised may receive this vaccine at local pharmacy or Health Dept. Aware to provide a copy of the vaccination record if obtained from local pharmacy or Health Dept. Verbalized acceptance and understanding.  Flu Vaccine status: Declined, Education has been provided regarding the importance of this vaccine but patient still declined. Advised may receive this vaccine at local pharmacy or Health Dept. Aware to provide a copy of the vaccination record if obtained from local pharmacy or Health Dept. Verbalized acceptance and understanding.  Pneumococcal vaccine status: Declined,  Education has been provided regarding the importance of this vaccine but patient still declined. Advised may receive this vaccine at local pharmacy or Health Dept. Aware to provide a copy of the vaccination record if obtained from local pharmacy or Health Dept. Verbalized acceptance and understanding.   Covid-19 vaccine status: Declined, Education has been provided regarding the importance of this vaccine but patient still declined. Advised may receive this vaccine at local pharmacy or Health  Dept.or vaccine clinic. Aware to provide a copy of the vaccination record if obtained from local pharmacy or Health Dept. Verbalized acceptance and understanding.  Qualifies for Shingles Vaccine? Yes   Zostavax completed No   Shingrix Completed?: No.    Education has been provided regarding the importance of this vaccine. Patient  has been advised to call insurance company to determine out of pocket expense if they have not yet received this vaccine. Advised may also receive vaccine at local pharmacy or Health Dept. Verbalized acceptance and understanding.  Screening Tests Health Maintenance  Topic Date Due   Hepatitis C Screening  06/26/2021 (Originally 06/10/1968)   INFLUENZA VACCINE  07/17/2021 (Originally 11/17/2020)   Zoster Vaccines- Shingrix (1 of 2) 03/29/2022 (Originally 06/10/2001)   Pneumonia Vaccine 11+ Years old (1 - PCV) 04/01/2022 (Originally 06/10/1956)   OPHTHALMOLOGY EXAM  09/12/2021   HEMOGLOBIN A1C  09/30/2021   FOOT EXAM  12/31/2021   HPV VACCINES  Aged Out   COLONOSCOPY (Pts 45-86yrs Insurance coverage will need to be confirmed)  Discontinued   TETANUS/TDAP  Discontinued   COVID-19 Vaccine  Discontinued    Health Maintenance  There are no preventive care reminders to display for this patient.  Cologuard ordered today - repeat every 3 years if normal  Lung Cancer Screening: (Low Dose CT Chest recommended if Age 16-80 years, 30 pack-year currently smoking OR have quit w/in 15years.) does not qualify.   Additional Screening:  Hepatitis C Screening: does qualify; Due  Vision Screening: Recommended annual ophthalmology exams for early detection of glaucoma and other disorders of the eye. Is the patient up to date with their annual eye exam?  Yes  Who is the provider or what is the name of the office in which the patient attends annual eye exams? [facility] If pt is not established with a provider, would they like to be referred to a provider to establish care? No .    Dental Screening: Recommended annual dental exams for proper oral hygiene  Community Resource Referral / Chronic Care Management: CRR required this visit?  No   CCM required this visit?  No      Plan:     I have personally reviewed and noted the following in the patients chart:   Medical and social history Use of alcohol, tobacco or illicit drugs  Current medications and supplements including opioid prescriptions. Patient is not currently taking opioid prescriptions. Functional ability and status Nutritional status Physical activity Advanced directives List of other physicians Hospitalizations, surgeries, and ER visits in previous 12 months Vitals Screenings to include cognitive, depression, and falls Referrals and appointments  In addition, I have reviewed and discussed with patient certain preventive protocols, quality metrics, and best practice recommendations. A written personalized care plan for preventive services as well as general preventive health recommendations were provided to patient.     Sandrea Hammond, LPN   [id number]   Nurse Notes: None

## 2021-04-03 NOTE — Patient Instructions (Signed)
Philip [name] , Thank you for taking time to come for your Medicare Wellness Visit. I appreciate your ongoing commitment to your health goals. Please review the following plan we discussed and let me know if I can assist you in the future.   Screening recommendations/referrals: Colonoscopy: Ordered Cologuard today Recommended yearly ophthalmology/optometry visit for glaucoma screening and checkup Recommended yearly dental visit for hygiene and checkup  Vaccinations: Declines all vaccines Influenza vaccine: Due Pneumococcal vaccine: Due Tdap vaccine: Due Shingles vaccine: Due   Covid-19: Due  Advanced directives: Advance directive discussed with you today. Even though you declined this today, please call our office should you change your mind, and we can give you the proper paperwork for you to fill out.   Conditions/risks identified: Aim for 30 minutes of exercise or brisk walking each day, drink 6-8 glasses of water and eat lots of fruits and vegetables.   Next appointment: Follow up in one year for your annual wellness visit.   Preventive Care 69 Years and Older, Male  Preventive care refers to lifestyle choices and visits with your health care provider that can promote health and wellness. What does preventive care include? A yearly physical exam. This is also called an annual well check. Dental exams once or twice a year. Routine eye exams. Ask your health care provider how often you should have your eyes checked. Personal lifestyle choices, including: Daily care of your teeth and gums. Regular physical activity. Eating a healthy diet. Avoiding tobacco and drug use. Limiting alcohol use. Practicing safe sex. Taking low doses of aspirin every day. Taking vitamin and mineral supplements as recommended by your health care provider. What happens during an annual well check? The services and screenings done by your health care provider during your annual well check will depend on  your age, overall health, lifestyle risk factors, and family history of disease. Counseling  Your health care provider may ask you questions about your: Alcohol use. Tobacco use. Drug use. Emotional well-being. Home and relationship well-being. Sexual activity. Eating habits. History of falls. Memory and ability to understand (cognition). Work and work Statistician. Screening  You may have the following tests or measurements: Height, weight, and BMI. Blood pressure. Lipid and cholesterol levels. These may be checked every 5 years, or more frequently if you are over 37 years old. Skin check. Lung cancer screening. You may have this screening every year starting at age 69 if you have a 30-pack-year history of smoking and currently smoke or have quit within the past 15 years. Fecal occult blood test (FOBT) of the stool. You may have this test every year starting at age 69. Flexible sigmoidoscopy or colonoscopy. You may have a sigmoidoscopy every 5 years or a colonoscopy every 10 years starting at age 69. Prostate cancer screening. Recommendations will vary depending on your family history and other risks. Hepatitis C blood test. Hepatitis B blood test. Sexually transmitted disease (STD) testing. Diabetes screening. This is done by checking your blood sugar (glucose) after you have not eaten for a while (fasting). You may have this done every 1-3 years. Abdominal aortic aneurysm (AAA) screening. You may need this if you are a current or former smoker. Osteoporosis. You may be screened starting at age 69 if you are at high risk. Talk with your health care provider about your test results, treatment options, and if necessary, the need for more tests. Vaccines  Your health care provider may recommend certain vaccines, such as: Influenza vaccine. This is recommended  every year. Tetanus, diphtheria, and acellular pertussis (Tdap, Td) vaccine. You may need a Td booster every 10 years. Zoster  vaccine. You may need this after age 69. Pneumococcal 13-valent conjugate (PCV13) vaccine. One dose is recommended after age 13. Pneumococcal polysaccharide (PPSV23) vaccine. One dose is recommended after age 69. Talk to your health care provider about which screenings and vaccines you need and how often you need them. This information is not intended to replace advice given to you by your health care provider. Make sure you discuss any questions you have with your health care provider. Document Released: 05/02/2015 Document Revised: 12/24/2015 Document Reviewed: 02/04/2015 Elsevier Interactive Patient Education  2017 [place] Prevention in the Home Falls can cause injuries. They can happen to people of all ages. There are many things you can do to make your home safe and to help prevent falls. What can I do on the outside of my home? Regularly fix the edges of walkways and driveways and fix any cracks. Remove anything that might make you trip as you walk through a door, such as a raised step or threshold. Trim any bushes or trees on the path to your home. Use bright outdoor lighting. Clear any walking paths of anything that might make someone trip, such as rocks or tools. Regularly check to see if handrails are loose or broken. Make sure that both sides of any steps have handrails. Any raised decks and porches should have guardrails on the edges. Have any leaves, snow, or ice cleared regularly. Use sand or salt on walking paths during winter. Clean up any spills in your garage right away. This includes oil or grease spills. What can I do in the bathroom? Use night lights. Install grab bars by the toilet and in the tub and shower. Do not use towel bars as grab bars. Use non-skid mats or decals in the tub or shower. If you need to sit down in the shower, use a plastic, non-slip stool. Keep the floor dry. Clean up any water that spills on the floor as soon as it happens. Remove  soap buildup in the tub or shower regularly. Attach bath mats securely with double-sided non-slip rug tape. Do not have throw rugs and other things on the floor that can make you trip. What can I do in the bedroom? Use night lights. Make sure that you have a light by your bed that is easy to reach. Do not use any sheets or blankets that are too big for your bed. They should not hang down onto the floor. Have a firm chair that has side arms. You can use this for support while you get dressed. Do not have throw rugs and other things on the floor that can make you trip. What can I do in the kitchen? Clean up any spills right away. Avoid walking on wet floors. Keep items that you use a lot in easy-to-reach places. If you need to reach something above you, use a strong step stool that has a grab bar. Keep electrical cords out of the way. Do not use floor polish or wax that makes floors slippery. If you must use wax, use non-skid floor wax. Do not have throw rugs and other things on the floor that can make you trip. What can I do with my stairs? Do not leave any items on the stairs. Make sure that there are handrails on both sides of the stairs and use them. Fix handrails that are broken  or loose. Make sure that handrails are as long as the stairways. Check any carpeting to make sure that it is firmly attached to the stairs. Fix any carpet that is loose or worn. Avoid having throw rugs at the top or bottom of the stairs. If you do have throw rugs, attach them to the floor with carpet tape. Make sure that you have a light switch at the top of the stairs and the bottom of the stairs. If you do not have them, ask someone to add them for you. What else can I do to help prevent falls? Wear shoes that: Do not have high heels. Have rubber bottoms. Are comfortable and fit you well. Are closed at the toe. Do not wear sandals. If you use a stepladder: Make sure that it is fully opened. Do not climb a  closed stepladder. Make sure that both sides of the stepladder are locked into place. Ask someone to hold it for you, if possible. Clearly mark and make sure that you can see: Any grab bars or handrails. First and last steps. Where the edge of each step is. Use tools that help you move around (mobility aids) if they are needed. These include: Canes. Walkers. Scooters. Crutches. Turn on the lights when you go into a dark area. Replace any light bulbs as soon as they burn out. Set up your furniture so you have a clear path. Avoid moving your furniture around. If any of your floors are uneven, fix them. If there are any pets around you, be aware of where they are. Review your medicines with your doctor. Some medicines can make you feel dizzy. This can increase your chance of falling. Ask your doctor what other things that you can do to help prevent falls. This information is not intended to replace advice given to you by your health care provider. Make sure you discuss any questions you have with your health care provider. Document Released: 01/30/2009 Document Revised: 09/11/2015 Document Reviewed: 05/10/2014 Elsevier Interactive Patient Education  2017 [employer].

## 2021-04-08 ENCOUNTER — Ambulatory Visit (INDEPENDENT_AMBULATORY_CARE_PROVIDER_SITE_OTHER): Payer: Medicare HMO | Admitting: Pharmacist

## 2021-04-08 DIAGNOSIS — E782 Mixed hyperlipidemia: Secondary | ICD-10-CM

## 2021-04-08 DIAGNOSIS — E1149 Type 2 diabetes mellitus with other diabetic neurological complication: Secondary | ICD-10-CM

## 2021-04-08 NOTE — Patient Instructions (Signed)
Visit Information  Thank you for taking time to visit with me today. Please don't hesitate to contact me if I can be of assistance to you before our next scheduled telephone appointment.  Following are the goals we discussed today:  Current Barriers:  Unable to independently afford treatment regimen Unable to achieve control of T2DM ; mostly due to cost and diet/lifestyle  Pharmacist Clinical Goal(s):  Over the next 90 days, patient will verbalize ability to afford treatment regimen achieve adherence to monitoring guidelines and medication adherence to achieve therapeutic efficacy achieve control of t2dm as evidenced by goal A1c<7% through collaboration with PharmD and provider.   Interventions: 1:1 collaboration with Dettinger, Fransisca Kaufmann, MD regarding development and update of comprehensive plan of care as evidenced by provider attestation and co-signature Inter-disciplinary care team collaboration (see longitudinal plan of care) Comprehensive medication review performed; medication list updated in electronic medical record  Diabetes: Controlled; current treatment: rybelsus 7mg ; metformin GFR 86 On statin, on ACEi Denies personal and family history of Medullary thyroid cancer ([place]) Novo nordisk patient assistance supply shipped to pcp office on 04/08/21 (2 month supply); will reapply in January when patient brings in forms Continue current therapy (recent steroid injection, but sugars holding steady) Current glucose readings: fasting glucose: <150, post prandial glucose: n/a Denies/reports hypoglycemic/hyperglycemic symptoms Current meal patterns: breakfast:  Discussed meal planning options and Plate method for healthy eating Avoid sugary drinks and desserts Incorporate balanced protein, non starchy veggies, 1 serving of carbohydrate with each meal Increase water intake Increase physical activity as able Current exercise: n/a Counseled on rybelsus, dietary changes Assessed patient  finances. Patient enrolled in novo nordisk patient assistance program until 03/18/21--will re-enroll in November; patient picked up last supply -Hyperlipidemia-->education provided on heart healthy diet, goal LDL<70, continue current medicine, patient active/exercises  Patient Goals/Self-Care Activities Over the next 90 days, patient will:  - take medications as prescribed check glucose daily (fasting), document, and provide at future appointments  Follow Up Plan: Telephone follow up appointment with care management team member scheduled for: 04/2021   Please call the care guide team at [phone] if you need to cancel or reschedule your appointment.   The patient verbalized understanding of instructions, educational materials, and care plan provided today and declined offer to receive copy of patient instructions, educational materials, and care plan.   Signature Regina Eck, PharmD, BCPS Clinical Pharmacist, [place]  II Phone [phone]

## 2021-04-08 NOTE — Progress Notes (Signed)
Chronic Care Management Pharmacy Note  04/08/2021 Name:  Philip [name] MRN:  [id number] DOB:  July 17, 1950  Summary: t2dm  Recommendations/Changes made from today's visit:  Diabetes: Controlled; current treatment: rybelsus 74m; metformin GFR 86 On statin, on ACEi Denies personal and family history of Medullary thyroid cancer ([place]) Novo nordisk patient assistance supply shipped to pcp office on 04/08/21 (2 month supply); will reapply in January when patient brings in forms Continue current therapy (recent steroid injection, but sugars holding steady) Current glucose readings: fasting glucose: <150, post prandial glucose: n/a Denies/reports hypoglycemic/hyperglycemic symptoms Current meal patterns: breakfast:  Discussed meal planning options and Plate method for healthy eating Avoid sugary drinks and desserts Incorporate balanced protein, non starchy veggies, 1 serving of carbohydrate with each meal Increase water intake Increase physical activity as able Current exercise: n/a Counseled on rybelsus, dietary changes Assessed patient finances. Patient enrolled in novo nordisk patient assistance program until 03/18/21--will re-enroll in November; patient picked up last supply -Hyperlipidemia-->education provided on heart healthy diet, goal LDL<70, continue current medicine, patient active/exercises  Patient Goals/Self-Care Activities Over the next 90 days, patient will:  - take medications as prescribed check glucose daily (fasting), document, and provide at future appointments  Follow Up Plan: Telephone follow up appointment with care management team member scheduled for: 04/2021  Subjective: Philip Bruntyis an 70y.o. year old male who is a primary patient of Dettinger, JFransisca Kaufmann MD.  The CCM team was consulted for assistance with disease management and care coordination needs.    Engaged with patient by telephone for follow up visit in response to provider referral for pharmacy  case management and/or care coordination services.   Consent to Services:  The patient was given information about Chronic Care Management services, agreed to services, and gave verbal consent prior to initiation of services.  Please see initial visit note for detailed documentation.   Patient Care Team: Dettinger, JFransisca Kaufmann MD as PCP - General (Family Medicine) PLavera Guise [facility](Pharmacist)  Objective:  Lab Results  Component Value Date   CREATININE 0.96 04/01/2021   CREATININE 0.91 12/31/2020   CREATININE 0.96 06/26/2020    Lab Results  Component Value Date   HGBA1C 7.0 (H) 04/01/2021   Last diabetic Eye exam:  Lab Results  Component Value Date/Time   HMDIABEYEEXA No Retinopathy 09/12/2020 12:00 AM    Last diabetic Foot exam: No results found for: HMDIABFOOTEX      Component Value Date/Time   CHOL 162 12/31/2020 0837   TRIG 145 12/31/2020 0837   TRIG 232 (H) 05/08/2013 1200   HDL 43 12/31/2020 0837   HDL 52 05/08/2013 1200   CHOLHDL 3.8 12/31/2020 0837   CHOLHDL 6.0 08/14/2019 0927   VLDL 74 (H) 08/14/2019 0927   LDLCALC 93 12/31/2020 0837   LDLCALC 200 (H) 05/08/2013 1200    Hepatic Function Latest Ref Rng & Units 12/31/2020 06/26/2020 12/26/2019  Total Protein 6.0 - 8.5 g/dL 7.2 6.9 6.7  Albumin 3.8 - 4.8 g/dL 4.7 4.5 4.5  AST 0 - 40 IU/L _0 ALT 0 - 44 IU/L _1 Alk Phosphatase 44 - 121 IU/L 97 98 84  Total Bilirubin 0.0 - 1.2 mg/dL 0.6 0.4 0.5  Bilirubin, Direct 0.00 - 0.40 mg/dL - - -    Lab Results  Component Value Date/Time   TSH 1.210 12/31/2020 08:37 AM   TSH 1.270 06/26/2020 08:40 AM    CBC Latest Ref Rng & Units 12/31/2020  06/26/2020 12/26/2019  WBC 3.4 - 10.8 x10E3/uL 8.8 8.6 11.3(H)  Hemoglobin 13.0 - 17.7 g/dL 14.9 15.3 15.2  Hematocrit 37.5 - 51.0 % 42.9 46.0 43.7  Platelets 150 - 450 x10E3/uL 328 357 351    Lab Results  Component Value Date/Time   VD25OH 71.6 04/01/2021 01:08 PM   VD25OH 13.2 (L) 10/23/2018 08:39 AM     Clinical ASCVD: No  The 10-year ASCVD risk score (Arnett DK, et al., 2019) is: 54.8%   Values used to calculate the score:     Age: 67 years     Sex: Male     Is Non-Hispanic African American: No     Diabetic: Yes     Tobacco smoker: Yes     Systolic Blood Pressure: 623 mmHg     Is BP treated: Yes     HDL Cholesterol: 43 mg/dL     Total Cholesterol: 162 mg/dL    Other: (CHADS2VASc if Afib, PHQ9 if depression, MMRC or CAT for COPD, ACT, DEXA)  Social History   Tobacco Use  Smoking Status Some Days   Packs/day: 0.50   Years: 53.00   Pack years: 26.50   Types: Cigarettes  Smokeless Tobacco Never   BP Readings from Last 3 Encounters:  04/01/21 (!) 164/88  12/31/20 (!) 153/73  11/07/20 (!) 172/94   Pulse Readings from Last 3 Encounters:  04/01/21 (!) 119  12/31/20 98  11/07/20 (!) 111   Wt Readings from Last 3 Encounters:  04/03/21 188 lb (85.3 kg)  04/01/21 188 lb (85.3 kg)  12/31/20 199 lb (90.3 kg)    Assessment: Review of patient past medical history, allergies, medications, health status, including review of consultants reports, laboratory and other test data, was performed as part of comprehensive evaluation and provision of chronic care management services.   SDOH:  (Social Determinants of Health) assessments and interventions performed:    CCM Care Plan  Allergies  Allergen Reactions   Ativan [Lorazepam] Other (See Comments)    Caused patient not to remember several days.   Blueberry [Vaccinium Angustifolium] Swelling   Fish Oil    Glucosamine Forte [Nutritional Supplements]     Weak, flu like symptoms   Shellfish Allergy    Ultram [Tramadol Hcl] Hives    Medications Reviewed Today     Reviewed by Lavera Guise, [facility] (Pharmacist) on 04/08/21 at 1058  Med List Status: <None>   Medication Order Taking? Sig Documenting Provider Last Dose Status Informant  aspirin EC 81 MG tablet [id number] No Take 81 mg by mouth in the morning and at bedtime.   [provider] Taking Active Self  atorvastatin (LIPITOR) 40 MG tablet [id number] No Take 1 tablet (40 mg total) by mouth daily. Dettinger, Fransisca Kaufmann, MD Taking Active   lisinopril (ZESTRIL) 20 MG tablet [id number] No Take 1 tablet (20 mg total) by mouth daily. Dettinger, Fransisca Kaufmann, MD Taking Active   metFORMIN (GLUCOPHAGE) 1000 MG tablet [id number] No Take 1 tablet (1,000 mg total) by mouth 2 (two) times daily with a meal. Dettinger, Fransisca Kaufmann, MD Taking Active   methocarbamol (ROBAXIN) 500 MG tablet [id number] No Take 1 tablet (500 mg total) by mouth every 8 (eight) hours as needed for muscle spasms. Loman Brooklyn, FNP Taking Active   Semaglutide [facility]) 7 MG TABS [id number] No Take 7 mg by mouth daily. Dettinger, Fransisca Kaufmann, MD Taking Active            Med Note (Jermon Chalfant D  Thu Jan 15, 2021 11:10 AM) VIA NOVO NORDISK PATIENT ASSISTANCE PROGRAM  sildenafil (REVATIO) 20 MG tablet [id number] No Take 1-4 tablets (20-80 mg total) by mouth as needed. Dettinger, Fransisca Kaufmann, MD Taking Active   Vitamin D, Ergocalciferol, (DRISDOL) 1.25 MG (50000 UNIT) CAPS capsule [id number] No Take 1 capsule (50,000 Units total) by mouth every 7 (seven) days. Dettinger, Fransisca Kaufmann, MD Taking Active             Patient Active Problem List   Diagnosis Date Noted   Status post CVA 08/14/2019   S/P lumbar microdiscectomy 08/01/2014   PTSD (post-traumatic stress disorder) 07/23/2014   Hyperlipidemia    Vitamin D deficiency 08/20/2013   Hypercalcemia 06/19/2013   Diabetes ([place]) 10/30/2012   Panic disorder 10/30/2012   Hypertension 09/24/2010     There is no immunization history on file for this patient.  Conditions to be addressed/monitored: HLD and DMII  Care Plan : PHARMD MEDICATION MANAGEMENT  Updates made by Lavera Guise, RPH since 04/08/2021 12:00 AM     Problem: DISEASE PROGRESSION PREVENTION   Priority: High     Long-Range Goal: T2DM   Recent Progress: On track  Priority: High   Note:   Current Barriers:  Unable to independently afford treatment regimen Unable to achieve control of T2DM ; mostly due to cost and diet/lifestyle  Pharmacist Clinical Goal(s):  Over the next 90 days, patient will verbalize ability to afford treatment regimen achieve adherence to monitoring guidelines and medication adherence to achieve therapeutic efficacy achieve control of t2dm as evidenced by goal A1c<7%  through collaboration with PharmD and provider.   Interventions: 1:1 collaboration with Dettinger, Fransisca Kaufmann, MD regarding development and update of comprehensive plan of care as evidenced by provider attestation and co-signature Inter-disciplinary care team collaboration (see longitudinal plan of care) Comprehensive medication review performed; medication list updated in electronic medical record  Diabetes: Controlled; current treatment: rybelsus 23m; metformin GFR 86 On statin, on ACEi Denies personal and [place] history of Medullary thyroid cancer ([place] Novo nordisk patient assistance supply shipped to pcp office on 04/08/21 (2 month supply); will reapply in January when patient brings in forms Continue current therapy (recent steroid injection, but sugars holding steady) Current glucose readings: fasting glucose: <150, post prandial glucose: n/a Denies/reports hypoglycemic/hyperglycemic symptoms Current meal patterns: breakfast:  Discussed meal planning options and Plate method for healthy eating Avoid sugary drinks and desserts Incorporate balanced protein, non starchy veggies, 1 serving of carbohydrate with each meal Increase water intake Increase physical activity as able Current exercise: n/a Counseled on rybelsus, dietary changes Assessed patient finances. Patient enrolled in novo nordisk patient assistance program until 03/18/21--will re-enroll in November ; patient picked up last supply  Patient Goals/Self-Care Activities Over the next 90 days, patient will:  -  take medications as prescribed check glucose daily (fasting), document, and provide at future appointments  Follow Up Plan: Telephone follow up appointment with care management team member scheduled for: 04/2021      Medication Assistance:  rybelsus obtained through novo nordisk medication assistance program.  Enrollment ends 03/2021--awaiting patient financials  [facility] [place] [address] [id number]: [phone]: [phone]  Follow Up:  Patient agrees to Care Plan and Follow-up.  Plan: Telephone follow up appointment with care management team member scheduled for:  04/2021  JRegina Eck PharmD, BCPS Clinical Pharmacist, [place]  [place] Phone [phone]

## 2021-04-18 DIAGNOSIS — E782 Mixed hyperlipidemia: Secondary | ICD-10-CM

## 2021-04-18 DIAGNOSIS — E1149 Type 2 diabetes mellitus with other diabetic neurological complication: Secondary | ICD-10-CM

## 2021-05-15 ENCOUNTER — Ambulatory Visit (INDEPENDENT_AMBULATORY_CARE_PROVIDER_SITE_OTHER): Payer: Medicare HMO | Admitting: Pharmacist

## 2021-05-15 DIAGNOSIS — E559 Vitamin D deficiency, unspecified: Secondary | ICD-10-CM

## 2021-05-15 DIAGNOSIS — E785 Hyperlipidemia, unspecified: Secondary | ICD-10-CM

## 2021-05-15 DIAGNOSIS — I10 Essential (primary) hypertension: Secondary | ICD-10-CM

## 2021-05-15 MED ORDER — VITAMIN D (ERGOCALCIFEROL) 1.25 MG (50000 UNIT) PO CAPS: 50000.0000 [IU] | ORAL_CAPSULE | ORAL | 3 refills | Status: DC

## 2021-05-15 MED ORDER — ATORVASTATIN CALCIUM 40 MG PO TABS: 40.0000 mg | ORAL_TABLET | Freq: Every day | ORAL | 3 refills | Status: DC

## 2021-05-15 MED ORDER — LISINOPRIL 20 MG PO TABS: 20.0000 mg | ORAL_TABLET | Freq: Every day | ORAL | 3 refills | Status: DC

## 2021-05-15 NOTE — Progress Notes (Signed)
Chronic Care Management Pharmacy Note  05/15/2021 Name:  Philip [name] MRN:  [id number] DOB:  04-29-1950  Summary: T2DM, HLD   Recommendations/Changes made from today's visit:  Diabetes: Controlled; current treatment: rybelsus 54m; metformin GFR 86 On statin, on ACEi Denies personal and family history of Medullary thyroid cancer (MTC) Application submitted for Novo nordisk patient assistance 2023 (rybelsus) Continue current therapy (last steroid injection was 03/2021) Refills given for BP and HLD meds Current glucose readings: fasting glucose: <150, post prandial glucose: n/a Denies/reports hypoglycemic/hyperglycemic symptoms Current meal patterns: breakfast:  Discussed meal planning options and Plate method for healthy eating Avoid sugary drinks and desserts Incorporate balanced protein, non starchy veggies, 1 serving of carbohydrate with each meal Increase water intake Increase physical activity as able Current exercise: n/a Counseled on rybelsus, dietary changes Assessed patient finances. Patient re-enrolled in novo nordisk patient assistance program for 2023; patient picked up last supply 03/2021 -Hyperlipidemia-->education provided on heart healthy diet, goal LDL<70, continue current medicine, patient active/exercises  Patient Goals/Self-Care Activities Over the next 90 days, patient will:  - take medications as prescribed check glucose daily (fasting), document, and provide at future appointments  Follow Up Plan: Telephone follow up appointment with care management team member scheduled for: 06/2021   Subjective: BElizandro Laurais an 71y.o. year old male who is a primary patient of Dettinger, JFransisca Kaufmann MD.  The CCM team was consulted for assistance with disease management and care coordination needs.    Engaged with patient by telephone for follow up visit in response to provider referral for pharmacy case management and/or care coordination services.   Consent to  Services:  The patient was given information about Chronic Care Management services, agreed to services, and gave verbal consent prior to initiation of services.  Please see initial visit note for detailed documentation.   Patient Care Team: Dettinger, JFransisca Kaufmann MD as PCP - General (Family Medicine) [facility]Lavera Guise [facility](Pharmacist)  Objective:  Lab Results  Component Value Date   CREATININE 0.96 04/01/2021   CREATININE 0.91 12/31/2020   CREATININE 0.96 06/26/2020    Lab Results  Component Value Date   HGBA1C 7.0 (H) 04/01/2021   Last diabetic Eye exam:  Lab Results  Component Value Date/Time   HMDIABEYEEXA No Retinopathy 09/12/2020 12:00 AM    Last diabetic Foot exam: No results found for: HMDIABFOOTEX      Component Value Date/Time   CHOL 162 12/31/2020 0837   TRIG 145 12/31/2020 0837   TRIG 232 (H) 05/08/2013 1200   HDL 43 12/31/2020 0837   HDL 52 05/08/2013 1200   CHOLHDL 3.8 12/31/2020 0837   CHOLHDL 6.0 08/14/2019 0927   VLDL 74 (H) 08/14/2019 0927   LDLCALC 93 12/31/2020 0837   LDLCALC 200 (H) 05/08/2013 1200    Hepatic Function Latest Ref Rng & Units 12/31/2020 06/26/2020 12/26/2019  Total Protein 6.0 - 8.5 g/dL 7.2 6.9 6.7  Albumin 3.8 - 4.8 g/dL 4.7 4.5 4.5  AST 0 - 40 IU/L 17 17 21   ALT 0 - 44 IU/L 15 15 21   Alk Phosphatase 44 - 121 IU/L 97 98 84  Total Bilirubin 0.0 - 1.2 mg/dL 0.6 0.4 0.5  Bilirubin, Direct 0.00 - 0.40 mg/dL - - -    Lab Results  Component Value Date/Time   TSH 1.210 12/31/2020 08:37 AM   TSH 1.270 06/26/2020 08:40 AM    CBC Latest Ref Rng & Units 12/31/2020 06/26/2020 12/26/2019  WBC 3.4 - 10.8 x10E3/uL 8.8  8.6 11.3(H)  Hemoglobin 13.0 - 17.7 g/dL 14.9 15.3 15.2  Hematocrit 37.5 - 51.0 % 42.9 46.0 43.7  Platelets 150 - 450 x10E3/uL 328 357 351    Lab Results  Component Value Date/Time   VD25OH 71.6 04/01/2021 01:08 PM   VD25OH 13.2 (L) 10/23/2018 08:39 AM    Clinical ASCVD: No  The 10-year ASCVD risk score (Arnett DK, et  al., 2019) is: 54.8%   Values used to calculate the score:     Age: 71 years     Sex: Male     Is Non-Hispanic African American: No     Diabetic: Yes     Tobacco smoker: Yes     Systolic Blood Pressure: 846 mmHg     Is BP treated: Yes     HDL Cholesterol: 43 mg/dL     Total Cholesterol: 162 mg/dL    Other: (CHADS2VASc if Afib, PHQ9 if depression, MMRC or CAT for COPD, ACT, DEXA)  Social History   Tobacco Use  Smoking Status Some Days   Packs/day: 0.50   Years: 53.00   Pack years: 26.50   Types: Cigarettes  Smokeless Tobacco Never   BP Readings from Last 3 Encounters:  04/01/21 (!) 164/88  12/31/20 (!) 153/73  11/07/20 (!) 172/94   Pulse Readings from Last 3 Encounters:  04/01/21 (!) 119  12/31/20 98  11/07/20 (!) 111   Wt Readings from Last 3 Encounters:  04/03/21 188 lb (85.3 kg)  04/01/21 188 lb (85.3 kg)  12/31/20 199 lb (90.3 kg)    Assessment: Review of patient past medical history, allergies, medications, health status, including review of consultants reports, laboratory and other test data, was performed as part of comprehensive evaluation and provision of chronic care management services.   SDOH:  (Social Determinants of Health) assessments and interventions performed:    CCM Care Plan  Allergies  Allergen Reactions   Ativan [Lorazepam] Other (See Comments)    Caused patient not to remember several days.   Blueberry [Vaccinium Angustifolium] Swelling   Fish Oil    Glucosamine Forte [Nutritional Supplements]     Weak, flu like symptoms   Shellfish Allergy    Ultram [Tramadol Hcl] Hives    Medications Reviewed Today     Reviewed by Lavera Guise, [facility] (Pharmacist) on 05/18/21 at 1108  Med List Status: <None>   Medication Order Taking? Sig Documenting Provider Last Dose Status Informant  aspirin EC 81 MG tablet [id number] No Take 81 mg by mouth in the morning and at bedtime.  [provider] Taking Active Self  atorvastatin (LIPITOR) 40  MG tablet [id number]  Take 1 tablet (40 mg total) by mouth daily. Dettinger, Fransisca Kaufmann, MD  Active   lisinopril (ZESTRIL) 20 MG tablet [id number]  Take 1 tablet (20 mg total) by mouth daily. Dettinger, Fransisca Kaufmann, MD  Active   metFORMIN (GLUCOPHAGE) 1000 MG tablet [id number] No Take 1 tablet (1,000 mg total) by mouth 2 (two) times daily with a meal. Dettinger, Fransisca Kaufmann, MD Taking Active   methocarbamol (ROBAXIN) 500 MG tablet [id number] No Take 1 tablet (500 mg total) by mouth every 8 (eight) hours as needed for muscle spasms. Loman Brooklyn, FNP Taking Active   Semaglutide [facility]) 7 MG TABS [id number] No Take 7 mg by mouth daily. Dettinger, Fransisca Kaufmann, MD Taking Active            Med Note Parthenia Ames Jan 15, 2021 11:10 AM) VIA NOVO [place]  PATIENT ASSISTANCE PROGRAM  sildenafil (REVATIO) 20 MG tablet [id number] No Take 1-4 tablets (20-80 mg total) by mouth as needed. Dettinger, Fransisca Kaufmann, MD Taking Active   Vitamin D, Ergocalciferol, (DRISDOL) 1.25 MG (50000 UNIT) CAPS capsule [id number]  Take 1 capsule (50,000 Units total) by mouth every 7 (seven) days. Dettinger, Fransisca Kaufmann, MD  Active             Patient Active Problem List   Diagnosis Date Noted   Status post CVA 08/14/2019   S/P lumbar microdiscectomy 08/01/2014   PTSD (post-traumatic stress disorder) 07/23/2014   Hyperlipidemia    Vitamin D deficiency 08/20/2013   Hypercalcemia 06/19/2013   Diabetes ([place]) 10/30/2012   Panic disorder 10/30/2012   Hypertension 09/24/2010     There is no immunization history on file for this patient.  Conditions to be addressed/monitored: HTN, HLD, and DMII  Care Plan : PHARMD MEDICATION MANAGEMENT  Updates made by Lavera Guise, RPH since 05/18/2021 12:00 AM     Problem: DISEASE PROGRESSION PREVENTION   Priority: High     Long-Range Goal: T2DM   Recent Progress: On track  Priority: High  Note:   Current Barriers:  Unable to independently afford treatment regimen Unable to  achieve control of T2DM ; mostly due to cost and diet/lifestyle  Pharmacist Clinical Goal(s):  Over the next 90 days, patient will verbalize ability to afford treatment regimen achieve adherence to monitoring guidelines and medication adherence to achieve therapeutic efficacy achieve control of t2dm as evidenced by goal A1c<7%  through collaboration with PharmD and provider.   Interventions: 1:1 collaboration with Dettinger, Fransisca Kaufmann, MD regarding development and update of comprehensive plan of care as evidenced by provider attestation and co-signature Inter-disciplinary care team collaboration (see longitudinal plan of care) Comprehensive medication review performed; medication list updated in electronic medical record  Diabetes: Controlled; current treatment: rybelsus 12m; metformin GFR 86 On statin, on ACEi Denies personal and family history of Medullary thyroid cancer (MTC) Application submitted for Novo nordisk patient assistance 2023 (rybelsus) Continue current therapy (last steroid injection was 03/2021) Refills given for BP and HLD meds Current glucose readings: fasting glucose: <150, post prandial glucose: n/a Denies/reports hypoglycemic/hyperglycemic symptoms Current meal patterns: breakfast:  Discussed meal planning options and Plate method for healthy eating Avoid sugary drinks and desserts Incorporate balanced protein, non starchy veggies, 1 serving of carbohydrate with each meal Increase water intake Increase physical activity as able Current exercise: n/a Counseled on rybelsus, dietary changes Assessed patient finances. Patient re-enrolled in novo nordisk patient assistance program for 2023 ; patient picked up last supply 03/2021 -Hyperlipidemia-->education provided on heart healthy diet, goal LDL<70, continue current medicine, patient active/exercises  Patient Goals/Self-Care Activities Over the next 90 days, patient will:  - take medications as prescribed check  glucose daily (fasting), document, and provide at future appointments  Follow Up Plan: Telephone follow up appointment with care management team member scheduled for: 06/2021      Medication Assistance: Application for [place]  medication assistance program. in process.  Anticipated assistance start date TBD.  See plan of care for additional detail.  [facility] [facility] [place] [id number]: [phone]: [phone] Follow Up:  Patient agrees to Care Plan and Follow-up.  Plan: Telephone follow up appointment with care management team member scheduled for:  3 MONTHS   JRegina Eck PharmD, BHill CityClinical Pharmacist, WTristan Schroeder  [place]  II Phone [phone]

## 2021-05-18 NOTE — Patient Instructions (Signed)
Visit Information  Following are the goals we discussed today:  Current Barriers:  Unable to independently afford treatment regimen Unable to achieve control of T2DM ; mostly due to cost and diet/lifestyle  Pharmacist Clinical Goal(s):  Over the next 90 days, patient will verbalize ability to afford treatment regimen achieve adherence to monitoring guidelines and medication adherence to achieve therapeutic efficacy achieve control of t2dm as evidenced by goal A1c<7% through collaboration with PharmD and provider.   Interventions: 1:1 collaboration with Dettinger, Fransisca Kaufmann, MD regarding development and update of comprehensive plan of care as evidenced by provider attestation and co-signature Inter-disciplinary care team collaboration (see longitudinal plan of care) Comprehensive medication review performed; medication list updated in electronic medical record  Diabetes: Controlled; current treatment: rybelsus 7mg ; metformin GFR 86 On statin, on ACEi Denies personal and family history of Medullary thyroid cancer (MTC) Application submitted for Novo nordisk patient assistance 2023 (rybelsus) Continue current therapy (last steroid injection was 03/2021) Refills given for BP and HLD meds Current glucose readings: fasting glucose: <150, post prandial glucose: n/a Denies/reports hypoglycemic/hyperglycemic symptoms Current meal patterns: breakfast:  Discussed meal planning options and Plate method for healthy eating Avoid sugary drinks and desserts Incorporate balanced protein, non starchy veggies, 1 serving of carbohydrate with each meal Increase water intake Increase physical activity as able Current exercise: n/a Counseled on rybelsus, dietary changes Assessed patient finances. Patient re-enrolled in novo nordisk patient assistance program for 2023; patient picked up last supply 03/2021 -Hyperlipidemia-->education provided on heart healthy diet, goal LDL<70, continue current medicine,  patient active/exercises  Patient Goals/Self-Care Activities Over the next 90 days, patient will:  - take medications as prescribed check glucose daily (fasting), document, and provide at future appointments  Follow Up Plan: Telephone follow up appointment with care management team member scheduled for: 06/2021   Plan: Telephone follow up appointment with care management team member scheduled for:  3 MONTHS  Signature Regina Eck, PharmD, BCPS Clinical Pharmacist, [place]  II Phone [phone]   Please call the care guide team at [phone] if you need to cancel or reschedule your appointment.   Patient verbalizes understanding of instructions and care plan provided today and agrees to view in [place]. Active MyChart status confirmed with patient.

## 2021-05-19 DIAGNOSIS — E785 Hyperlipidemia, unspecified: Secondary | ICD-10-CM | POA: Diagnosis not present

## 2021-05-19 DIAGNOSIS — I10 Essential (primary) hypertension: Secondary | ICD-10-CM

## 2021-06-30 LAB — COLOGUARD

## 2021-07-02 ENCOUNTER — Ambulatory Visit (INDEPENDENT_AMBULATORY_CARE_PROVIDER_SITE_OTHER): Payer: Medicare HMO | Admitting: Family Medicine

## 2021-07-02 ENCOUNTER — Encounter: Payer: Self-pay | Admitting: Family Medicine

## 2021-07-02 VITALS — BP 173/75 | HR 105 | Ht 69.0 in | Wt 188.0 lb

## 2021-07-02 DIAGNOSIS — E785 Hyperlipidemia, unspecified: Secondary | ICD-10-CM | POA: Diagnosis not present

## 2021-07-02 DIAGNOSIS — E1169 Type 2 diabetes mellitus with other specified complication: Secondary | ICD-10-CM | POA: Diagnosis not present

## 2021-07-02 DIAGNOSIS — E119 Type 2 diabetes mellitus without complications: Secondary | ICD-10-CM | POA: Diagnosis not present

## 2021-07-02 DIAGNOSIS — I10 Essential (primary) hypertension: Secondary | ICD-10-CM

## 2021-07-02 DIAGNOSIS — E1159 Type 2 diabetes mellitus with other circulatory complications: Secondary | ICD-10-CM | POA: Diagnosis not present

## 2021-07-02 DIAGNOSIS — N529 Male erectile dysfunction, unspecified: Secondary | ICD-10-CM | POA: Diagnosis not present

## 2021-07-02 DIAGNOSIS — Z125 Encounter for screening for malignant neoplasm of prostate: Secondary | ICD-10-CM | POA: Diagnosis not present

## 2021-07-02 LAB — BAYER DCA HB A1C WAIVED: HB A1C (BAYER DCA - WAIVED): 6.8 % — ABNORMAL HIGH (ref 4.8–5.6)

## 2021-07-02 NOTE — Progress Notes (Signed)
? ?BP (!) 173/75   Pulse (!) 105   Ht 5' 9"  (1.753 m)   Wt 188 lb (85.3 kg)   SpO2 98%   BMI 27.76 kg/m?   ? ?Subjective:  ? ?Patient ID: Philip [name], male    DOB: Sep 09, 1950, 71 y.o.   MRN: [id number] ? ?HPI: ?Philip [name] is a 71 y.o. male presenting on 07/02/2021 for Medical Management of Chronic Issues, Diabetes, Hypertension, and Hyperlipidemia ? ? ?HPI ?Type 2 diabetes mellitus ?Patient comes in today for recheck of his diabetes. Patient has been currently taking metformin. Patient is currently on an ACE inhibitor/ARB. Patient has not seen an ophthalmologist this year. Patient denies any issues with their feet. The symptom started onset as an adult hyperlipidemia and hypertension ARE RELATED TO DM  ? ?Hypertension ?Patient is currently on lisinopril, and their blood pressure today is 173/75 but at home he has numbers that run consistently in the 110s over 60s.  He says that is where he runs all the time.. Patient denies any lightheadedness or dizziness. Patient denies headaches, blurred vision, chest pains, shortness of breath, or weakness. Denies any side effects from medication and is content with current medication.  ? ?Hyperlipidemia ?Patient is coming in for recheck of his hyperlipidemia. The patient is currently taking atorvastatin. They deny any issues with myalgias or history of liver damage from it. They deny any focal numbness or weakness or chest pain.  ? ?Erectile dysfunction recheck. ?Patient uses occasional sildenafil and it seems to work for him. ? ? ?He is very physically active and goes to gym regularly. ? ?Relevant past medical, surgical, family and social history reviewed and updated as indicated. Interim medical history since our last visit reviewed. ?Allergies and medications reviewed and updated. ? ?Review of Systems  ?Constitutional:  Negative for chills and fever.  ?Eyes:  Negative for visual disturbance.  ?Respiratory:  Negative for shortness of breath and wheezing.    ?Cardiovascular:  Negative for chest pain and leg swelling.  ?Musculoskeletal:  Positive for arthralgias. Negative for back pain and gait problem.  ?Skin:  Negative for rash.  ?Neurological:  Negative for dizziness, weakness and numbness.  ?All other systems reviewed and are negative. ? ?Per HPI unless specifically indicated above ? ? ?Allergies as of 07/02/2021   ? ?   Reactions  ? Ativan [lorazepam] Other (See Comments)  ? Caused patient not to remember several days.  ? Blueberry [vaccinium Angustifolium] Swelling  ? Fish Oil   ? Glucosamine Forte [nutritional Supplements]   ? Weak, flu like symptoms  ? Shellfish Allergy   ? Ultram [tramadol Hcl] Hives  ? ?  ? ?  ?Medication List  ?  ? ?  ? Accurate as of July 02, 2021 11:29 AM. If you have any questions, ask your nurse or doctor.  ?  ?  ? ?  ? ?aspirin EC 81 MG tablet ?Take 81 mg by mouth in the morning and at bedtime. ?  ?atorvastatin 40 MG tablet ?Commonly known as: LIPITOR ?Take 1 tablet (40 mg total) by mouth daily. ?  ?lisinopril 20 MG tablet ?Commonly known as: ZESTRIL ?Take 1 tablet (20 mg total) by mouth daily. ?  ?metFORMIN 1000 MG tablet ?Commonly known as: GLUCOPHAGE ?Take 1 tablet (1,000 mg total) by mouth 2 (two) times daily with a meal. ?  ?methocarbamol 500 MG tablet ?Commonly known as: Robaxin ?Take 1 tablet (500 mg total) by mouth every 8 (eight) hours as needed for muscle spasms. ?  ?  Rybelsus 7 MG Tabs ?Generic drug: Semaglutide ?Take 7 mg by mouth daily. ?  ?sildenafil 20 MG tablet ?Commonly known as: REVATIO ?Take 1-4 tablets (20-80 mg total) by mouth as needed. ?  ?Vitamin D (Ergocalciferol) 1.25 MG (50000 UNIT) Caps capsule ?Commonly known as: DRISDOL ?Take 1 capsule (50,000 Units total) by mouth every 7 (seven) days. ?  ? ?  ? ? ? ?Objective:  ? ?BP (!) 173/75   Pulse (!) 105   Ht 5' 9"  (1.753 m)   Wt 188 lb (85.3 kg)   SpO2 98%   BMI 27.76 kg/m?   ?Wt Readings from Last 3 Encounters:  ?07/02/21 188 lb (85.3 kg)  ?04/03/21 188 lb  (85.3 kg)  ?04/01/21 188 lb (85.3 kg)  ?  ?Physical Exam ?Vitals and nursing note reviewed.  ?Constitutional:   ?   General: He is not in acute distress. ?   Appearance: He is well-developed. He is not diaphoretic.  ?Eyes:  ?   General: No scleral icterus. ?   Conjunctiva/sclera: Conjunctivae normal.  ?Neck:  ?   Thyroid: No thyromegaly.  ?Cardiovascular:  ?   Rate and Rhythm: Normal rate and regular rhythm.  ?   Heart sounds: Normal heart sounds. No murmur heard. ?Pulmonary:  ?   Effort: Pulmonary effort is normal. No respiratory distress.  ?   Breath sounds: Normal breath sounds. No wheezing.  ?Musculoskeletal:     ?   General: Normal range of motion.  ?   Cervical back: Neck supple.  ?Lymphadenopathy:  ?   Cervical: No cervical adenopathy.  ?Skin: ?   General: Skin is warm and dry.  ?   Findings: No rash.  ?Neurological:  ?   Mental Status: He is alert and oriented to person, place, and time.  ?   Coordination: Coordination normal.  ?Psychiatric:     ?   Behavior: Behavior normal.  ? ? ? ? ?Assessment & Plan:  ? ?Problem List Items Addressed This Visit   ? ?  ? Cardiovascular and Mediastinum  ? Hypertension  ?  ? Endocrine  ? Diabetes ([place]) - Primary  ? Relevant Orders  ? CBC with Differential/Platelet  ? CMP14+EGFR  ? Lipid panel  ? Bayer DCA Hb A1c Waived  ?  ? Other  ? Hyperlipidemia  ? Relevant Orders  ? CBC with Differential/Platelet  ? CMP14+EGFR  ? Lipid panel  ? Bayer DCA Hb A1c Waived  ? ?Other Visit Diagnoses   ? ? Essential hypertension      ? Relevant Orders  ? CBC with Differential/Platelet  ? CMP14+EGFR  ? Lipid panel  ? Bayer DCA Hb A1c Waived  ? Erectile dysfunction, unspecified erectile dysfunction type      ? Relevant Orders  ? PSA, total and free  ? ?  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  He says his stress has been a lot higher especially trying to find a place to  live. ? ?Follow up plan: ?Return in about 3 months (around 10/02/2021), or if symptoms worsen or fail to improve, for Diabetes recheck. ? ?Counseling provided for all of the vaccine components ?Orders Placed This Encounter  ?Procedures  ? CBC with Differential/Platelet  ? CMP14+EGFR  ? Lipid panel  ? Bayer DCA Hb A1c Waived  ? PSA, total and free  ? ? ?  Caryl Pina, MD ?[place] ?07/02/2021, 11:29 AM ? ? ? ? ?

## 2021-07-03 LAB — CMP14+EGFR
ALT: 16 IU/L (ref 0–44)
AST: 22 IU/L (ref 0–40)
Albumin/Globulin Ratio: 2.2 (ref 1.2–2.2)
Albumin: 4.6 g/dL (ref 3.8–4.8)
Alkaline Phosphatase: 112 IU/L (ref 44–121)
BUN/Creatinine Ratio: 12 (ref 10–24)
BUN: 10 mg/dL (ref 8–27)
Bilirubin Total: 0.4 mg/dL (ref 0.0–1.2)
CO2: 24 mmol/L (ref 20–29)
Calcium: 11.1 mg/dL — ABNORMAL HIGH (ref 8.6–10.2)
Chloride: 104 mmol/L (ref 96–106)
Creatinine, Ser: 0.82 mg/dL (ref 0.76–1.27)
Globulin, Total: 2.1 g/dL (ref 1.5–4.5)
Glucose: 114 mg/dL — ABNORMAL HIGH (ref 70–99)
Potassium: 4.2 mmol/L (ref 3.5–5.2)
Sodium: 142 mmol/L (ref 134–144)
Total Protein: 6.7 g/dL (ref 6.0–8.5)
eGFR: 94 mL/min/{1.73_m2} (ref >59)

## 2021-07-03 LAB — CBC WITH DIFFERENTIAL/PLATELET
Basophils Absolute: 0.1 10*3/uL (ref 0.0–0.2)
Basos: 1 %
EOS (ABSOLUTE): 0.3 10*3/uL (ref 0.0–0.4)
Eos: 3 %
Hematocrit: 41.2 % (ref 37.5–51.0)
Hemoglobin: 13.9 g/dL (ref 13.0–17.7)
Immature Grans (Abs): 0 10*3/uL (ref 0.0–0.1)
Immature Granulocytes: 0 %
Lymphocytes Absolute: 3.4 10*3/uL — ABNORMAL HIGH (ref 0.7–3.1)
Lymphs: 41 %
MCH: 31.6 pg (ref 26.6–33.0)
MCHC: 33.7 g/dL (ref 31.5–35.7)
MCV: 94 fL (ref 79–97)
Monocytes Absolute: 0.8 10*3/uL (ref 0.1–0.9)
Monocytes: 10 %
Neutrophils Absolute: 3.9 10*3/uL (ref 1.4–7.0)
Neutrophils: 45 %
Platelets: 335 10*3/uL (ref 150–450)
RBC: 4.4 x10E6/uL (ref 4.14–5.80)
RDW: 12.9 % (ref 11.6–15.4)
WBC: 8.5 10*3/uL (ref 3.4–10.8)

## 2021-07-03 LAB — LIPID PANEL
Chol/HDL Ratio: 3.2 ratio (ref 0.0–5.0)
Cholesterol, Total: 160 mg/dL (ref 100–199)
HDL: 50 mg/dL (ref >39)
LDL Chol Calc (NIH): 82 mg/dL (ref 0–99)
Triglycerides: 162 mg/dL — ABNORMAL HIGH (ref 0–149)
VLDL Cholesterol Cal: 28 mg/dL (ref 5–40)

## 2021-07-03 LAB — PSA, TOTAL AND FREE
PSA, Free Pct: 22.7 %
PSA, Free: 0.25 ng/mL
Prostate Specific Ag, Serum: 1.1 ng/mL (ref 0.0–4.0)

## 2021-08-20 LAB — COLOGUARD

## 2021-08-26 ENCOUNTER — Telehealth: Payer: Self-pay | Admitting: Pharmacist

## 2021-08-26 NOTE — Telephone Encounter (Signed)
Philip [name] PATIENT ASSISTANCE ?RENEWAL FAXED TO COMPANY FOR RYBELSUS '7MG'$  ?

## 2021-09-07 ENCOUNTER — Other Ambulatory Visit: Payer: Self-pay | Admitting: Family Medicine

## 2021-09-07 DIAGNOSIS — E119 Type 2 diabetes mellitus without complications: Secondary | ICD-10-CM

## 2021-10-05 ENCOUNTER — Ambulatory Visit (INDEPENDENT_AMBULATORY_CARE_PROVIDER_SITE_OTHER): Payer: Medicare HMO | Admitting: Family Medicine

## 2021-10-05 ENCOUNTER — Encounter: Payer: Self-pay | Admitting: Family Medicine

## 2021-10-05 VITALS — BP 157/88 | HR 122 | Temp 98.0°F

## 2021-10-05 DIAGNOSIS — E119 Type 2 diabetes mellitus without complications: Secondary | ICD-10-CM

## 2021-10-05 DIAGNOSIS — I10 Essential (primary) hypertension: Secondary | ICD-10-CM

## 2021-10-05 DIAGNOSIS — E782 Mixed hyperlipidemia: Secondary | ICD-10-CM | POA: Diagnosis not present

## 2021-10-05 LAB — BAYER DCA HB A1C WAIVED: HB A1C (BAYER DCA - WAIVED): 6.7 % — ABNORMAL HIGH (ref 4.8–5.6)

## 2021-10-05 MED ORDER — METFORMIN HCL 1000 MG PO TABS: 1000.0000 mg | ORAL_TABLET | Freq: Two times a day (BID) | ORAL | 3 refills | Status: DC

## 2021-10-05 NOTE — Progress Notes (Signed)
BP (!) 157/88   Pulse (!) 122   Temp 98 F (36.7 C)   SpO2 95%    Subjective:   Patient ID: Philip [name], male    DOB: [birth year], 71 y.o.   MRN: [id number]  HPI: Philip [name] is a 71 y.o. male presenting on 10/05/2021 for Medical Management of Chronic Issues and Diabetes   HPI Type 2 diabetes mellitus Patient comes in today for recheck of his diabetes. Patient has been currently taking metformin and Rybelsus, A1c looks good at 6.7. Patient is currently on an ACE inhibitor/ARB. Patient has not seen an ophthalmologist this year. Patient denies any issues with their feet. The symptom started onset as an adult hypertension and hyperlipidemia ARE RELATED TO DM   Hypertension Patient is currently on lisinopril, and their blood pressure today is 157/88. Patient denies any lightheadedness or dizziness. Patient denies headaches, blurred vision, chest pains, shortness of breath, or weakness. Denies any side effects from medication and is content with current medication.    Anxiety and depression Patient is coming in for anxiety and depression.  He says he has been doing a lot worse over the past couple months and just has not been motivated over the past couple weeks leaving to work out or do things.  He has been feeling more down and depressed.  He he says he just wanted to notify of this.  He denies any suicidal ideations.  He says he does not want to go on any treatment or do any counseling or any kind of thing like that at this point just wanted [place] to notify how he was feeling.  Hyperlipidemia and h/o CVA Patient is coming in for recheck of his hyperlipidemia. The patient is currently taking atorvastatin. They deny any issues with myalgias or history of liver damage from it. They deny any focal numbness or weakness or chest pain.  He does take 2 aspirin a day but has been having more bleeding more easily through the skin.  Relevant past medical, surgical, family and social history reviewed and  updated as indicated. Interim medical history since our last visit reviewed. Allergies and medications reviewed and updated.  Review of Systems  Constitutional:  Negative for chills and fever.  Eyes:  Negative for visual disturbance.  Respiratory:  Negative for shortness of breath and wheezing.   Cardiovascular:  Negative for chest pain and leg swelling.  Musculoskeletal:  Negative for back pain and gait problem.  Skin:  Negative for rash.  Neurological:  Negative for dizziness, weakness and light-headedness.  All other systems reviewed and are negative.   Per HPI unless specifically indicated above   Allergies as of 10/05/2021       Reactions   Ativan [lorazepam] Other (See Comments)   Caused patient not to remember several days.   Blueberry [vaccinium Angustifolium] Swelling   Fish Oil    Glucosamine Forte [nutritional Supplements]    Weak, flu like symptoms   Shellfish Allergy    Ultram [tramadol Hcl] Hives        Medication List        Accurate as of October 05, 2021  2:15 PM. If you have any questions, ask your nurse or doctor.          aspirin EC 81 MG tablet Take 81 mg by mouth in the morning and at bedtime.   atorvastatin 40 MG tablet Commonly known as: LIPITOR Take 1 tablet (40 mg total) by mouth daily.   lisinopril 20 MG  tablet Commonly known as: ZESTRIL Take 1 tablet (20 mg total) by mouth daily.   metFORMIN 1000 MG tablet Commonly known as: GLUCOPHAGE Take 1 tablet (1,000 mg total) by mouth 2 (two) times daily with a meal.   methocarbamol 500 MG tablet Commonly known as: Robaxin Take 1 tablet (500 mg total) by mouth every 8 (eight) hours as needed for muscle spasms.   Rybelsus 7 MG Tabs Generic drug: Semaglutide Take 7 mg by mouth daily.   sildenafil 20 MG tablet Commonly known as: REVATIO Take 1-4 tablets (20-80 mg total) by mouth as needed.   Vitamin D (Ergocalciferol) 1.25 MG (50000 UNIT) Caps capsule Commonly known as: DRISDOL Take 1  capsule (50,000 Units total) by mouth every 7 (seven) days.         Objective:   BP (!) 157/88   Pulse (!) 122   Temp 98 F (36.7 C)   SpO2 95%   Wt Readings from Last 3 Encounters:  07/02/21 188 lb (85.3 kg)  04/03/21 188 lb (85.3 kg)  04/01/21 188 lb (85.3 kg)    Physical Exam Vitals and nursing note reviewed.  Constitutional:      General: He is not in acute distress.    Appearance: He is well-developed. He is not diaphoretic.  Eyes:     General: No scleral icterus.    Conjunctiva/sclera: Conjunctivae normal.  Neck:     Thyroid: No thyromegaly.  Cardiovascular:     Rate and Rhythm: Regular rhythm. Tachycardia present.     Heart sounds: Normal heart sounds. No murmur heard. Pulmonary:     Effort: Pulmonary effort is normal. No respiratory distress.     Breath sounds: Normal breath sounds. No wheezing.  Abdominal:     General: Abdomen is flat. Bowel sounds are normal. There is no distension.     Tenderness: There is no abdominal tenderness. There is no guarding or rebound.  Musculoskeletal:        General: No swelling. Normal range of motion.     Cervical back: Neck supple.  Lymphadenopathy:     Cervical: No cervical adenopathy.  Skin:    General: Skin is warm and dry.     Findings: No bruising, erythema, lesion or rash.  Neurological:     Mental Status: He is alert and oriented to person, place, and time.     Coordination: Coordination normal.  Psychiatric:        Behavior: Behavior normal.       Assessment & Plan:   Problem List Items Addressed This Visit       Cardiovascular and Mediastinum   Hypertension     Endocrine   Diabetes ([place]) - Primary   Relevant Medications   metFORMIN (GLUCOPHAGE) 1000 MG tablet   Other Relevant Orders   Bayer DCA Hb A1c Waived     Other   Hyperlipidemia    Blood pressure and heart rate elevated today but likely because of anxiety.  We discussed possible treatments including counseling or medication for this  and he declined both at this point but just says its been overwhelming for him.  Diabetes looks good at 6.7 A1c.  No changes for now we will keep close eye on heart rate and blood pressure. Recommended for him to go ahead and back on the baby aspirin to where he only takes 1 a day because of his bleeding issues. Follow up plan: Return in about 3 months (around 01/05/2022), or if symptoms worsen or fail to improve,  for Diabetes hypertension high cholesterol.  Counseling provided for all of the vaccine components Orders Placed This Encounter  Procedures   Bayer [place]gs Hb A1c [place] Alyana Kreiter, MD [facility] 10/05/2021, 2:15 PM

## 2021-10-21 DIAGNOSIS — E119 Type 2 diabetes mellitus without complications: Secondary | ICD-10-CM | POA: Diagnosis not present

## 2021-11-12 ENCOUNTER — Telehealth: Payer: Self-pay

## 2021-11-12 NOTE — Telephone Encounter (Signed)
Informed pt he will need to bring updated income for novo nordisk application.  Pt will go by bank and bring statement by office tomorrow for [place]. Will also possibly need samples.

## 2021-11-20 ENCOUNTER — Telehealth: Payer: Self-pay | Admitting: Pharmacist

## 2021-11-20 NOTE — Telephone Encounter (Signed)
Please let patient know I have left Rybelsus '7mg'$  daily samples up front for pick up.  We are processing his new paperwork for patient assistance

## 2021-11-20 NOTE — Telephone Encounter (Signed)
Pt made aware and understood

## 2021-11-20 NOTE — Telephone Encounter (Signed)
No answer no machine

## 2021-12-29 ENCOUNTER — Telehealth: Payer: Self-pay

## 2021-12-29 NOTE — Telephone Encounter (Signed)
4 boxes of Rybelsus arrived form pt assistance - called and notified pt ready to pick up

## 2022-01-06 ENCOUNTER — Ambulatory Visit (INDEPENDENT_AMBULATORY_CARE_PROVIDER_SITE_OTHER): Payer: Medicare HMO | Admitting: Family Medicine

## 2022-01-06 ENCOUNTER — Encounter: Payer: Self-pay | Admitting: Family Medicine

## 2022-01-06 VITALS — BP 141/81 | HR 96 | Temp 98.0°F | Ht 69.0 in | Wt 196.0 lb

## 2022-01-06 DIAGNOSIS — M19011 Primary osteoarthritis, right shoulder: Secondary | ICD-10-CM

## 2022-01-06 DIAGNOSIS — E782 Mixed hyperlipidemia: Secondary | ICD-10-CM | POA: Diagnosis not present

## 2022-01-06 DIAGNOSIS — E119 Type 2 diabetes mellitus without complications: Secondary | ICD-10-CM

## 2022-01-06 DIAGNOSIS — I10 Essential (primary) hypertension: Secondary | ICD-10-CM

## 2022-01-06 LAB — BAYER DCA HB A1C WAIVED: HB A1C (BAYER DCA - WAIVED): 7.4 % — ABNORMAL HIGH (ref 4.8–5.6)

## 2022-01-06 MED ORDER — PREDNISONE 20 MG PO TABS: ORAL_TABLET | ORAL | 0 refills | Status: DC

## 2022-01-06 MED ORDER — RYBELSUS 7 MG PO TABS: 7.0000 mg | ORAL_TABLET | Freq: Every day | ORAL | 3 refills | Status: DC

## 2022-01-06 MED ORDER — SILDENAFIL CITRATE 20 MG PO TABS: 20.0000 mg | ORAL_TABLET | ORAL | 1 refills | Status: DC | PRN

## 2022-01-06 NOTE — Progress Notes (Signed)
BP (!) 141/81   Pulse 96   Temp 98 F (36.7 C)   Ht 5' 9"  (1.753 m)   Wt 196 lb (88.9 kg)   SpO2 98%   BMI 28.94 kg/m    Subjective:   Patient ID: Philip [name], male    DOB: 16-Jun-1950, 71 y.o.   MRN: [id number]  HPI: Philip [name] is a 71 y.o. male presenting on 01/06/2022 for Medical Management of Chronic Issues and Diabetes   HPI Right shoulder pain Patient says over the past month he has been having more right shoulder pain that is been bothering him and he has not been able to work out like he normally did because of it and then he has been having more feeling down and anxious and somewhat depressed.  He says he needs to get back to working out the way that he likes.  He does usually do jujitsu.  He has been swimming some but its not been the same for him.  Type 2 diabetes mellitus Patient comes in today for recheck of his diabetes. Patient has been currently taking metformin and Rybelsus, although has been out of his Rybelsus for the past 2 months due to prescription issues but he has now and will restart it tomorrow. Patient is currently on an ACE inhibitor/ARB. Patient has not seen an ophthalmologist this year. Patient denies any issues with their feet. The symptom started onset as an adult hypertension and hyperlipidemia ARE RELATED TO DM   Hypertension Patient is currently on lisinopril, and their blood pressure today is 141/81. Patient denies any lightheadedness or dizziness. Patient denies headaches, blurred vision, chest pains, shortness of breath, or weakness. Denies any side effects from medication and is content with current medication.   Hyperlipidemia Patient is coming in for recheck of his hyperlipidemia. The patient is currently taking atorvastatin. They deny any issues with myalgias or history of liver damage from it. They deny any focal numbness or weakness or chest pain.   Relevant past medical, surgical, family and social history reviewed and updated as  indicated. Interim medical history since our last visit reviewed. Allergies and medications reviewed and updated.  Review of Systems  Constitutional:  Negative for chills and fever.  Eyes:  Negative for visual disturbance.  Respiratory:  Negative for shortness of breath and wheezing.   Cardiovascular:  Negative for chest pain and leg swelling.  Musculoskeletal:  Positive for arthralgias. Negative for back pain, gait problem, joint swelling and myalgias.  Skin:  Negative for rash.  Neurological:  Negative for dizziness, weakness and light-headedness.  All other systems reviewed and are negative.   Per HPI unless specifically indicated above   Allergies as of 01/06/2022       Reactions   Ativan [lorazepam] Other (See Comments)   Caused patient not to remember several days.   Blueberry [vaccinium Angustifolium] Swelling   Fish Oil    Glucosamine Forte [nutritional Supplements]    Weak, flu like symptoms   Shellfish Allergy    Ultram [tramadol Hcl] Hives        Medication List        Accurate as of January 06, 2022  3:19 PM. If you have any questions, ask your nurse or doctor.          STOP taking these medications    methocarbamol 500 MG tablet Commonly known as: Robaxin Stopped by: Worthy Rancher, MD       TAKE these medications    aspirin EC  81 MG tablet Take 81 mg by mouth in the morning and at bedtime.   atorvastatin 40 MG tablet Commonly known as: LIPITOR Take 1 tablet (40 mg total) by mouth daily.   lisinopril 20 MG tablet Commonly known as: ZESTRIL Take 1 tablet (20 mg total) by mouth daily.   metFORMIN 1000 MG tablet Commonly known as: GLUCOPHAGE Take 1 tablet (1,000 mg total) by mouth 2 (two) times daily with a meal.   predniSONE 20 MG tablet Commonly known as: DELTASONE 2 po at same time daily for 5 days Started by: Fransisca Kaufmann Alexanderjames Berg, MD   Rybelsus 7 MG Tabs Generic drug: Semaglutide Take 7 mg by mouth daily.   sildenafil 20 MG  tablet Commonly known as: REVATIO Take 1-4 tablets (20-80 mg total) by mouth as needed.   Vitamin D (Ergocalciferol) 1.25 MG (50000 UNIT) Caps capsule Commonly known as: DRISDOL Take 1 capsule (50,000 Units total) by mouth every 7 (seven) days.         Objective:   BP (!) 141/81   Pulse 96   Temp 98 F (36.7 C)   Ht 5' 9"  (1.753 m)   Wt 196 lb (88.9 kg)   SpO2 98%   BMI 28.94 kg/m   Wt Readings from Last 3 Encounters:  01/06/22 196 lb (88.9 kg)  07/02/21 188 lb (85.3 kg)  04/03/21 188 lb (85.3 kg)    Physical Exam Vitals and nursing note reviewed.  Constitutional:      General: He is not in acute distress.    Appearance: He is well-developed. He is not diaphoretic.  Eyes:     General: No scleral icterus.    Conjunctiva/sclera: Conjunctivae normal.  Neck:     Thyroid: No thyromegaly.  Cardiovascular:     Rate and Rhythm: Normal rate and regular rhythm.     Heart sounds: Normal heart sounds. No murmur heard. Pulmonary:     Effort: Pulmonary effort is normal. No respiratory distress.     Breath sounds: Normal breath sounds. No wheezing.  Musculoskeletal:        General: Normal range of motion.     Right shoulder: Tenderness (Right shoulder pain with overhead range of motion and abduction and flexion and extension past 90 degrees) present. No deformity, bony tenderness or crepitus. Normal range of motion. Normal strength.     Cervical back: Neck supple.  Lymphadenopathy:     Cervical: No cervical adenopathy.  Skin:    General: Skin is warm and dry.     Findings: No rash.  Neurological:     Mental Status: He is alert and oriented to person, place, and time.     Coordination: Coordination normal.  Psychiatric:        Behavior: Behavior normal.       Assessment & Plan:   Problem List Items Addressed This Visit       Cardiovascular and Mediastinum   Hypertension   Relevant Medications   sildenafil (REVATIO) 20 MG tablet   Other Relevant Orders   CBC  with Differential/Platelet   CMP14+EGFR   Lipid panel   Bayer DCA Hb A1c Waived     Endocrine   Diabetes ([place]) - Primary   Relevant Medications   Semaglutide (RYBELSUS) 7 MG TABS   Other Relevant Orders   CBC with Differential/Platelet   CMP14+EGFR   Lipid panel   Bayer DCA Hb A1c Waived     Other   Hyperlipidemia   Relevant Medications   sildenafil (REVATIO)  20 MG tablet   Other Relevant Orders   CBC with Differential/Platelet   CMP14+EGFR   Lipid panel   Bayer DCA Hb A1c Waived   Other Visit Diagnoses     Arthropathy of right shoulder       Relevant Medications   predniSONE (DELTASONE) 20 MG tablet       Patient's right shoulder and right knee have been bothering him a lot more recently and he feels like he pulled them, he cannot identify a specific moment when he pulled them but he did pull it.  We will give short course of prednisone, he will watch his sugars closely.  A1c 7.4, restart Rybelsus and should do fine. Follow up plan: Return in about 3 months (around 04/07/2022), or if symptoms worsen or fail to improve, for Diabetes hypertension and cholesterol.  Counseling provided for all of the vaccine components Orders Placed This Encounter  Procedures   CBC with Differential/Platelet   CMP14+EGFR   Lipid panel   Bayer DCA Hb A1c [place], MD [facility] 01/06/2022, 3:19 PM

## 2022-01-07 LAB — CBC WITH DIFFERENTIAL/PLATELET
Basophils Absolute: 0.1 10*3/uL (ref 0.0–0.2)
Basos: 1 %
EOS (ABSOLUTE): 0.2 10*3/uL (ref 0.0–0.4)
Eos: 2 %
Hematocrit: 41.8 % (ref 37.5–51.0)
Hemoglobin: 14.2 g/dL (ref 13.0–17.7)
Immature Grans (Abs): 0 10*3/uL (ref 0.0–0.1)
Immature Granulocytes: 0 %
Lymphocytes Absolute: 2.2 10*3/uL (ref 0.7–3.1)
Lymphs: 26 %
MCH: 30.3 pg (ref 26.6–33.0)
MCHC: 34 g/dL (ref 31.5–35.7)
MCV: 89 fL (ref 79–97)
Monocytes Absolute: 0.7 10*3/uL (ref 0.1–0.9)
Monocytes: 9 %
Neutrophils Absolute: 5.1 10*3/uL (ref 1.4–7.0)
Neutrophils: 62 %
Platelets: 301 10*3/uL (ref 150–450)
RBC: 4.68 x10E6/uL (ref 4.14–5.80)
RDW: 12.4 % (ref 11.6–15.4)
WBC: 8.2 10*3/uL (ref 3.4–10.8)

## 2022-01-07 LAB — LIPID PANEL
Chol/HDL Ratio: 3.4 ratio (ref 0.0–5.0)
Cholesterol, Total: 158 mg/dL (ref 100–199)
HDL: 47 mg/dL (ref >39)
LDL Chol Calc (NIH): 84 mg/dL (ref 0–99)
Triglycerides: 158 mg/dL — ABNORMAL HIGH (ref 0–149)
VLDL Cholesterol Cal: 27 mg/dL (ref 5–40)

## 2022-01-07 LAB — CMP14+EGFR
ALT: 10 IU/L (ref 0–44)
AST: 13 IU/L (ref 0–40)
Albumin/Globulin Ratio: 2 (ref 1.2–2.2)
Albumin: 4.6 g/dL (ref 3.9–4.9)
Alkaline Phosphatase: 93 IU/L (ref 44–121)
BUN/Creatinine Ratio: 13 (ref 10–24)
BUN: 12 mg/dL (ref 8–27)
Bilirubin Total: 0.5 mg/dL (ref 0.0–1.2)
CO2: 23 mmol/L (ref 20–29)
Calcium: 11.4 mg/dL — ABNORMAL HIGH (ref 8.6–10.2)
Chloride: 104 mmol/L (ref 96–106)
Creatinine, Ser: 0.92 mg/dL (ref 0.76–1.27)
Globulin, Total: 2.3 g/dL (ref 1.5–4.5)
Glucose: 212 mg/dL — ABNORMAL HIGH (ref 70–99)
Potassium: 4.8 mmol/L (ref 3.5–5.2)
Sodium: 142 mmol/L (ref 134–144)
Total Protein: 6.9 g/dL (ref 6.0–8.5)
eGFR: 89 mL/min/{1.73_m2} (ref >59)

## 2022-02-18 ENCOUNTER — Telehealth: Payer: Self-pay

## 2022-02-18 NOTE — Telephone Encounter (Signed)
Mailed Novo Nordisk renewal application to patient home.   Leotis Isham L. CPhT [facility]  

## 2022-02-22 ENCOUNTER — Telehealth: Payer: Self-pay | Admitting: Family Medicine

## 2022-02-23 NOTE — Telephone Encounter (Signed)
Appointment made 11/8  1:10pm Dr . Warrick Parisian

## 2022-02-24 ENCOUNTER — Ambulatory Visit (INDEPENDENT_AMBULATORY_CARE_PROVIDER_SITE_OTHER): Payer: Medicare HMO | Admitting: Family Medicine

## 2022-02-24 ENCOUNTER — Encounter: Payer: Self-pay | Admitting: Family Medicine

## 2022-02-24 VITALS — BP 128/81 | HR 122 | Temp 97.6°F | Ht 69.0 in | Wt 196.0 lb

## 2022-02-24 DIAGNOSIS — G5701 Lesion of sciatic nerve, right lower limb: Secondary | ICD-10-CM | POA: Diagnosis not present

## 2022-02-24 MED ORDER — PREDNISONE 20 MG PO TABS: ORAL_TABLET | ORAL | 0 refills | Status: DC

## 2022-02-24 MED ORDER — CYCLOBENZAPRINE HCL 10 MG PO TABS: 10.0000 mg | ORAL_TABLET | Freq: Three times a day (TID) | ORAL | 2 refills | Status: DC | PRN

## 2022-02-24 NOTE — Progress Notes (Signed)
BP 128/81   Pulse (!) 122   Temp 97.6 F (36.4 C)   Ht '5\' 9"'$  (1.753 m)   Wt 196 lb (88.9 kg)   SpO2 98%   BMI 28.94 kg/m    Subjective:   Patient ID: Philip [name], male    DOB: [birth year], 71 y.o.   MRN: [id number]  HPI: Philip [name] is a 71 y.o. male presenting on 02/24/2022 for bilateral leg, knee, hip, low back pain   HPI Patient is coming in today with complaints of back pain going down his right leg and left knee pain as well.  He says it started about 10 days ago but has not been improving.  He has been using some Advil and [place] and eucalyptus and traction and heat.  He says the pain comes around from his right lower buttocks down the right lateral aspect of the leg down to just past the knee.  He describes it as a burning and tingling sensation that is 8 out of 10.  He is also developed just in the past week pain on anterior aspect of the left knee.  He says it is more aggravating and it keeps him from being able to do things that he needs  Relevant past medical, surgical, family and social history reviewed and updated as indicated. Interim medical history since our last visit reviewed. Allergies and medications reviewed and updated.  Review of Systems  Constitutional:  Negative for chills and fever.  Respiratory:  Negative for shortness of breath and wheezing.   Cardiovascular:  Negative for chest pain and leg swelling.  Musculoskeletal:  Positive for arthralgias, back pain and myalgias. Negative for gait problem.  Skin:  Negative for rash.  Neurological:  Positive for numbness.  All other systems reviewed and are negative.   Per HPI unless specifically indicated above   Allergies as of 02/24/2022       Reactions   Ativan [lorazepam] Other (See Comments)   Caused patient not to remember several days.   Blueberry [vaccinium Angustifolium] Swelling   Fish Oil    Glucosamine Forte [nutritional Supplements]    Weak, flu like symptoms   Shellfish Allergy    Ultram  [tramadol Hcl] Hives        Medication List        Accurate as of February 24, 2022  1:52 PM. If you have any questions, ask your nurse or doctor.          STOP taking these medications    sildenafil 20 MG tablet Commonly known as: REVATIO Stopped by: Philip Kaufmann Shaqueta Casady, MD       TAKE these medications    aspirin EC 81 MG tablet Take 81 mg by mouth in the morning and at bedtime.   atorvastatin 40 MG tablet Commonly known as: LIPITOR Take 1 tablet (40 mg total) by mouth daily.   cyclobenzaprine 10 MG tablet Commonly known as: FLEXERIL Take 1 tablet (10 mg total) by mouth 3 (three) times daily as needed for muscle spasms. Started by: Philip Kaufmann Sheina Mcleish, MD   lisinopril 20 MG tablet Commonly known as: ZESTRIL Take 1 tablet (20 mg total) by mouth daily.   metFORMIN 1000 MG tablet Commonly known as: GLUCOPHAGE Take 1 tablet (1,000 mg total) by mouth 2 (two) times daily with a meal.   predniSONE 20 MG tablet Commonly known as: DELTASONE Take 3 tabs daily for 1 week, then 2 tabs daily for week 2, then 1 tab daily for week  3. What changed: additional instructions Changed by: Philip Kaufmann Zuleyka Kloc, MD   Rybelsus 7 MG Tabs Generic drug: Semaglutide Take 7 mg by mouth daily.   Vitamin D (Ergocalciferol) 1.25 MG (50000 UNIT) Caps capsule Commonly known as: DRISDOL Take 1 capsule (50,000 Units total) by mouth every 7 (seven) days.         Objective:   BP 128/81   Pulse (!) 122   Temp 97.6 F (36.4 C)   Ht '5\' 9"'$  (1.753 m)   Wt 196 lb (88.9 kg)   SpO2 98%   BMI 28.94 kg/m   Wt Readings from Last 3 Encounters:  02/24/22 196 lb (88.9 kg)  01/06/22 196 lb (88.9 kg)  07/02/21 188 lb (85.3 kg)    Physical Exam Vitals and nursing note reviewed.  Constitutional:      Appearance: Normal appearance. He is normal weight.  Musculoskeletal:     Lumbar back: Tenderness present. No deformity or bony tenderness. Negative right straight leg raise test and negative  left straight leg raise test.  Neurological:     Mental Status: He is alert.       Assessment & Plan:   Problem List Items Addressed This Visit   None Visit Diagnoses     Neuropathy of right sciatic nerve    -  Primary   Relevant Medications   predniSONE (DELTASONE) 20 MG tablet   cyclobenzaprine (FLEXERIL) 10 MG tablet       We will give prednisone and Flexeril and see if that helps calm down, likely sciatic nerve pain. Follow up plan: Return if symptoms worsen or fail to improve.  Counseling provided for all of the vaccine components No orders of the defined types were placed in this encounter.   Caryl Pina, MD [facility] 02/24/2022, 1:52 PM

## 2022-03-22 ENCOUNTER — Ambulatory Visit (INDEPENDENT_AMBULATORY_CARE_PROVIDER_SITE_OTHER): Payer: Medicare HMO | Admitting: Family Medicine

## 2022-03-22 ENCOUNTER — Other Ambulatory Visit: Payer: Self-pay

## 2022-03-22 ENCOUNTER — Encounter: Payer: Self-pay | Admitting: Family Medicine

## 2022-03-22 ENCOUNTER — Emergency Department (HOSPITAL_COMMUNITY)
Admission: EM | Admit: 2022-03-22 | Discharge: 2022-03-23 | Disposition: A | Discharge status: Home / Self Care | Payer: Medicare HMO | Attending: Emergency Medicine | Admitting: Emergency Medicine

## 2022-03-22 ENCOUNTER — Telehealth: Payer: Self-pay

## 2022-03-22 ENCOUNTER — Telehealth: Payer: Self-pay | Admitting: Family Medicine

## 2022-03-22 ENCOUNTER — Encounter (HOSPITAL_COMMUNITY): Payer: Self-pay

## 2022-03-22 VITALS — BP 149/84 | HR 141 | Temp 98.1°F | Resp 22 | Ht 69.0 in | Wt 196.0 lb

## 2022-03-22 DIAGNOSIS — R000 Tachycardia, unspecified: Secondary | ICD-10-CM

## 2022-03-22 DIAGNOSIS — Z1152 Encounter for screening for COVID-19: Secondary | ICD-10-CM | POA: Diagnosis not present

## 2022-03-22 DIAGNOSIS — R231 Pallor: Secondary | ICD-10-CM | POA: Diagnosis not present

## 2022-03-22 DIAGNOSIS — Z7984 Long term (current) use of oral hypoglycemic drugs: Secondary | ICD-10-CM | POA: Diagnosis not present

## 2022-03-22 DIAGNOSIS — E1165 Type 2 diabetes mellitus with hyperglycemia: Secondary | ICD-10-CM | POA: Diagnosis not present

## 2022-03-22 DIAGNOSIS — D72829 Elevated white blood cell count, unspecified: Secondary | ICD-10-CM | POA: Diagnosis not present

## 2022-03-22 DIAGNOSIS — I10 Essential (primary) hypertension: Secondary | ICD-10-CM | POA: Insufficient documentation

## 2022-03-22 DIAGNOSIS — R531 Weakness: Secondary | ICD-10-CM | POA: Insufficient documentation

## 2022-03-22 DIAGNOSIS — R42 Dizziness and giddiness: Secondary | ICD-10-CM | POA: Insufficient documentation

## 2022-03-22 DIAGNOSIS — R5383 Other fatigue: Secondary | ICD-10-CM | POA: Diagnosis not present

## 2022-03-22 DIAGNOSIS — Z7982 Long term (current) use of aspirin: Secondary | ICD-10-CM | POA: Diagnosis not present

## 2022-03-22 DIAGNOSIS — Z79899 Other long term (current) drug therapy: Secondary | ICD-10-CM | POA: Insufficient documentation

## 2022-03-22 LAB — COMPREHENSIVE METABOLIC PANEL
ALT: 17 U/L (ref 0–44)
AST: 19 U/L (ref 15–41)
Albumin: 4 g/dL (ref 3.5–5.0)
Alkaline Phosphatase: 59 U/L (ref 38–126)
Anion gap: 10 (ref 5–15)
BUN: 11 mg/dL (ref 8–23)
CO2: 21 mmol/L — ABNORMAL LOW (ref 22–32)
Calcium: 10.8 mg/dL — ABNORMAL HIGH (ref 8.9–10.3)
Chloride: 106 mmol/L (ref 98–111)
Creatinine, Ser: 1.18 mg/dL (ref 0.61–1.24)
GFR, Estimated: >60 mL/min (ref >60)
Glucose, Bld: 165 mg/dL — ABNORMAL HIGH (ref 70–99)
Potassium: 4 mmol/L (ref 3.5–5.1)
Sodium: 137 mmol/L (ref 135–145)
Total Bilirubin: 1.5 mg/dL — ABNORMAL HIGH (ref 0.3–1.2)
Total Protein: 6.5 g/dL (ref 6.5–8.1)

## 2022-03-22 LAB — CBC WITH DIFFERENTIAL/PLATELET
Abs Immature Granulocytes: 0.03 10*3/uL (ref 0.00–0.07)
Basophils Absolute: 0 10*3/uL (ref 0.0–0.1)
Basophils Relative: 0 %
Eosinophils Absolute: 0.1 10*3/uL (ref 0.0–0.5)
Eosinophils Relative: 1 %
HCT: 42.6 % (ref 39.0–52.0)
Hemoglobin: 14.7 g/dL (ref 13.0–17.0)
Immature Granulocytes: 0 %
Lymphocytes Relative: 33 %
Lymphs Abs: 3.7 10*3/uL (ref 0.7–4.0)
MCH: 31.1 pg (ref 26.0–34.0)
MCHC: 34.5 g/dL (ref 30.0–36.0)
MCV: 90.3 fL (ref 80.0–100.0)
Monocytes Absolute: 0.9 10*3/uL (ref 0.1–1.0)
Monocytes Relative: 8 %
Neutro Abs: 6.6 10*3/uL (ref 1.7–7.7)
Neutrophils Relative %: 58 %
Platelets: 269 10*3/uL (ref 150–400)
RBC: 4.72 MIL/uL (ref 4.22–5.81)
RDW: 12.3 % (ref 11.5–15.5)
WBC: 11.4 10*3/uL — ABNORMAL HIGH (ref 4.0–10.5)
nRBC: 0 % (ref 0.0–0.2)

## 2022-03-22 LAB — I-STAT VENOUS BLOOD GAS, ED
Acid-base deficit: 3 mmol/L — ABNORMAL HIGH (ref 0.0–2.0)
Bicarbonate: 21 mmol/L (ref 20.0–28.0)
Calcium, Ion: 1.37 mmol/L (ref 1.15–1.40)
HCT: 43 % (ref 39.0–52.0)
Hemoglobin: 14.6 g/dL (ref 13.0–17.0)
O2 Saturation: 81 %
Potassium: 4.1 mmol/L (ref 3.5–5.1)
Sodium: 139 mmol/L (ref 135–145)
TCO2: 22 mmol/L (ref 22–32)
pCO2, Ven: 32.5 mmHg — ABNORMAL LOW (ref 44–60)
pH, Ven: 7.418 (ref 7.25–7.43)
pO2, Ven: 44 mmHg (ref 32–45)

## 2022-03-22 LAB — TROPONIN I (HIGH SENSITIVITY)
Troponin I (High Sensitivity): 5 ng/L (ref <18)
Troponin I (High Sensitivity): 6 ng/L (ref <18)

## 2022-03-22 LAB — URINALYSIS, ROUTINE W REFLEX MICROSCOPIC
Bacteria, UA: NONE SEEN
Bilirubin Urine: NEGATIVE
Glucose, UA: NEGATIVE mg/dL
Hgb urine dipstick: NEGATIVE
Ketones, ur: 5 mg/dL — AB
Leukocytes,Ua: NEGATIVE
Nitrite: NEGATIVE
Protein, ur: 30 mg/dL — AB
Specific Gravity, Urine: 1.012 (ref 1.005–1.030)
pH: 5 (ref 5.0–8.0)

## 2022-03-22 LAB — RESP PANEL BY RT-PCR (FLU A&B, COVID) ARPGX2
Influenza A by PCR: NEGATIVE
Influenza B by PCR: NEGATIVE
SARS Coronavirus 2 by RT PCR: NEGATIVE

## 2022-03-22 MED ORDER — SODIUM CHLORIDE 0.9 % IV BOLUS: 1000.0000 mL | Freq: Once | INTRAVENOUS | Status: DC

## 2022-03-22 NOTE — Telephone Encounter (Signed)
Pt made aware. Will come in to triage today for injection instructions.

## 2022-03-22 NOTE — Progress Notes (Signed)
BP (!) 149/84   Pulse (!) 141   Temp 98.1 F (36.7 C) (Oral)   Resp (!) 22   Ht '5\' 9"'$  (1.753 m)   Wt 196 lb (88.9 kg)   SpO2 97%   BMI 28.94 kg/m    Subjective:   Patient ID: Philip [name], male    DOB: 01-07-1951, 71 y.o.   MRN: [id number]  HPI: Philip [name] is a 71 y.o. male presenting on 03/22/2022 for No chief complaint on file.   HPI Patient is coming in today with complaints of fatigue.  Sweats and clamminess and weakness that is all been going on today.  Blood sugars been running in the 200s and that this is a concern that he had it feeling that way.  He denies any chest pain.  He has been feeling palpitations and his heart rate is very high.  He says he is just not been eating much but he has been drinking fluids but has not felt like eating as much with the palpitations and flutters.  He does get a little lightheaded and dizzy as well.  Relevant past medical, surgical, family and social history reviewed and updated as indicated. Interim medical history since our last visit reviewed. Allergies and medications reviewed and updated.  Review of Systems  Constitutional:  Positive for fatigue. Negative for chills and fever.  HENT:  Negative for congestion, sinus pressure and sinus pain.   Respiratory:  Negative for chest tightness, shortness of breath and wheezing.   Cardiovascular:  Positive for chest pain and palpitations. Negative for leg swelling.  Gastrointestinal:  Negative for abdominal pain, constipation, diarrhea, nausea and vomiting.  Genitourinary:  Negative for dysuria, hematuria and urgency.  Musculoskeletal:  Negative for back pain and gait problem.  Skin:  Negative for rash.  Neurological:  Positive for dizziness and weakness. Negative for light-headedness and headaches.  All other systems reviewed and are negative.   Per HPI unless specifically indicated above   Allergies as of 03/22/2022       Reactions   Ativan [lorazepam] Other (See Comments)   Caused  patient not to remember several days.   Blueberry [vaccinium Angustifolium] Swelling   Fish Oil    Glucosamine Forte [nutritional Supplements]    Weak, flu like symptoms   Shellfish Allergy    Ultram [tramadol Hcl] Hives        Medication List        Accurate as of March 22, 2022  4:15 PM. If you have any questions, ask your nurse or doctor.          aspirin EC 81 MG tablet Take 81 mg by mouth in the morning and at bedtime.   atorvastatin 40 MG tablet Commonly known as: LIPITOR Take 1 tablet (40 mg total) by mouth daily.   cyclobenzaprine 10 MG tablet Commonly known as: FLEXERIL Take 1 tablet (10 mg total) by mouth 3 (three) times daily as needed for muscle spasms.   lisinopril 20 MG tablet Commonly known as: ZESTRIL Take 1 tablet (20 mg total) by mouth daily.   metFORMIN 1000 MG tablet Commonly known as: GLUCOPHAGE Take 1 tablet (1,000 mg total) by mouth 2 (two) times daily with a meal.   predniSONE 20 MG tablet Commonly known as: DELTASONE Take 3 tabs daily for 1 week, then 2 tabs daily for week 2, then 1 tab daily for week 3.   Rybelsus 7 MG Tabs Generic drug: Semaglutide Take 7 mg by mouth daily.  Vitamin D (Ergocalciferol) 1.25 MG (50000 UNIT) Caps capsule Commonly known as: DRISDOL Take 1 capsule (50,000 Units total) by mouth every 7 (seven) days.         Objective:   BP (!) 149/84   Pulse (!) 141   Temp 98.1 F (36.7 C) (Oral)   Resp (!) 22   Ht '5\' 9"'$  (1.753 m)   Wt 196 lb (88.9 kg)   SpO2 97%   BMI 28.94 kg/m   Wt Readings from Last 3 Encounters:  03/22/22 196 lb (88.9 kg)  02/24/22 196 lb (88.9 kg)  01/06/22 196 lb (88.9 kg)    Physical Exam Vitals and nursing note reviewed.  Constitutional:      Appearance: He is well-developed. He is ill-appearing and diaphoretic.  Eyes:     General: No scleral icterus.    Conjunctiva/sclera: Conjunctivae normal.  Neck:     Thyroid: No thyromegaly.  Cardiovascular:     Rate and Rhythm:  Regular rhythm. Tachycardia present.     Heart sounds: Normal heart sounds. No murmur heard. Pulmonary:     Effort: Pulmonary effort is normal. No respiratory distress.     Breath sounds: Normal breath sounds. No wheezing, rhonchi or rales.  Musculoskeletal:        General: Normal range of motion.     Cervical back: Neck supple.  Lymphadenopathy:     Cervical: No cervical adenopathy.  Skin:    General: Skin is warm.     Findings: No rash.  Neurological:     Mental Status: He is alert and oriented to person, place, and time.     Coordination: Coordination normal.  Psychiatric:        Behavior: Behavior normal.     EKG sinus tachycardia  Assessment & Plan:   Problem List Items Addressed This Visit   None Visit Diagnoses     Tachycardia    -  Primary   Relevant Orders   EKG 12-Lead (Completed)   Clammy skin       Weak           Patient is tachycardiac and having clamminess and weakness, recommended for him to the emergency department.  He is going to have a friend come pick him up.  They want to go to the cardiac facility and EMS would not likely take him there.  Follow up plan: Return if symptoms worsen or fail to improve.  Counseling provided for all of the vaccine components Orders Placed This Encounter  Procedures   EKG 12-Lead    Caryl Pina, MD [facility] 03/22/2022, 4:15 PM

## 2022-03-22 NOTE — ED Provider Triage Note (Signed)
Emergency Medicine Provider Triage Evaluation Note  Philip [name] , a 71 y.o. male  was evaluated in triage.  Pt complains of weakness. Patient states symptoms ongoing since Thursday of last week. He describes feeling profoundly weak and intermittently clammy. He states she has also had palpitations without chest pain or shortness of breath. He states he is sleeping okay but as soon as he wakes up he feels very fatigued. He denies cough, fever, abdominal pain, nausea, vomiting, diarrhea, dysuria. He saw PCP Today and noted to be tachycardic so sent him here. States his BG has been running normal for him except single BG 188 yesterday.  Review of Systems  Positive: See above Negative:   Physical Exam  BP (!) 132/93 (BP Location: Right Arm)   Pulse (!) 126   Temp 98.6 F (37 C) (Oral)   Resp 20   Ht '5\' 9"'$  (1.753 m)   Wt 86.6 kg   SpO2 99%   BMI 28.21 kg/m  Gen:   Awake, no distress   Resp:  Normal effort  MSK:   Moves extremities without difficulty  Other:  Tachycardic without murmur  Medical Decision Making  Medically screening exam initiated at 5:29 PM.  Appropriate orders placed.  Chipper Koudelka was informed that the remainder of the evaluation will be completed by another provider, this initial triage assessment does not replace that evaluation, and the importance of remaining in the ED until their evaluation is complete.  Broad diff. Will get DKA labs, give IVF, r/o ACS.    Mickie Hillier, PA-C 03/22/22 1731

## 2022-03-22 NOTE — Telephone Encounter (Signed)
Pt states that he feels bad, weak, fuzzy all over  12/2- 6am- 266- No meds, no food           2pm- 218  12/3- 7:30- took Rybelsus ( no food) 184           12:30--2:10 ( no food)            Slept until 9pm            9pm- 164  12/4-7am- 188 no meds or foods  Pt states that the last time he felt like this he went to hospital and blood sugar was over 500  Pt does not have mychart

## 2022-03-22 NOTE — ED Triage Notes (Signed)
Pt reports he has felt very weak and SOB since Tuesday with lack of appetite. He states he currently can feel his heart racing. HR 126 in triage. He denies current CP. He brought with him a log of his blood sugars as well. Pt is A&OX4 ambulatory with independent steady gait.

## 2022-03-22 NOTE — Telephone Encounter (Signed)
Pt needs appt to see Almyra Free. Received pt assistance paperwork and has question/ needs help understanding it and filling it out.

## 2022-03-22 NOTE — Telephone Encounter (Signed)
Please let him know that I have placed sample for Tresiba for him until we figure out what is kicking up his blood sugars I want him to do 5 units daily of this insulin until things settle down, please have him make an appointment as soon as possible with me as well.  Bring his meter with him when he comes

## 2022-03-23 MED ORDER — SODIUM CHLORIDE 0.9 % IV BOLUS: 1000.0000 mL | Freq: Once | INTRAVENOUS | Status: DC

## 2022-03-23 MED ORDER — SODIUM CHLORIDE 0.9 % IV BOLUS
2000.0000 mL | Freq: Once | INTRAVENOUS | Status: AC
  Administered 2022-03-23: 2000 mL via INTRAVENOUS

## 2022-03-23 NOTE — Telephone Encounter (Signed)
Reached back out to patient regarding his PAP questions. PT currently in the hospital but will call me back regarding letter he rec'd from company.

## 2022-03-23 NOTE — Discharge Instructions (Addendum)
Continue to follow-up with your primary care doctor for ongoing management of your hypertension and diabetes.  Dehydration can sneak up on you if your blood sugars are elevated.  Maintain good sugar control through medications and diet.  Continue to stay well-hydrated.  The lab results in the emergency department were reassuring.  It is also reassuring that your heart rate came back to normal and stayed normal during your time in the emergency department.  Given that you had a fast heart rate and palpitations, you can consider following up with a cardiologist for long-term monitoring of your heart rate and rhythm.  If you would like to establish care with a cardiologist, there is a office in [place] that you can call.  Their number is below.  Return to the emergency department at anytime for any new or worsening symptoms of concern.

## 2022-03-23 NOTE — ED Provider Notes (Signed)
[facility] EMERGENCY DEPARTMENT Provider Note   CSN: [id number] Arrival date & time: 03/22/22  1718     History  Chief Complaint  Patient presents with   Weakness    Philip [name] is a 71 y.o. male.   Weakness Patient presents for generalized weakness.  Medical history includes HTN, DM, panic disorder, HLD.  Current symptoms have been ongoing for the past 5 days.  He describes intermittent palpitations and persistent fatigue.  He went to PCP yesterday.  He was noted to be tachycardic.  He was sent to the emergency department for further evaluation.  Unfortunately, patient had a prolonged waiting time in the emergency department waiting room.  Currently, he endorses ongoing fatigue.  He denies palpitations.  He denies any areas of discomfort.     Home Medications Prior to Admission medications   Medication Sig Start Date End Date Taking? Authorizing Provider  aspirin EC 81 MG tablet Take 81 mg by mouth in the morning and at bedtime.     [provider]  atorvastatin (LIPITOR) 40 MG tablet Take 1 tablet (40 mg total) by mouth daily. 05/15/21   Dettinger, Fransisca Kaufmann, MD  cyclobenzaprine (FLEXERIL) 10 MG tablet Take 1 tablet (10 mg total) by mouth 3 (three) times daily as needed for muscle spasms. 02/24/22   Dettinger, Fransisca Kaufmann, MD  lisinopril (ZESTRIL) 20 MG tablet Take 1 tablet (20 mg total) by mouth daily. 05/15/21   Dettinger, Fransisca Kaufmann, MD  metFORMIN (GLUCOPHAGE) 1000 MG tablet Take 1 tablet (1,000 mg total) by mouth 2 (two) times daily with a meal. 10/05/21   Dettinger, Fransisca Kaufmann, MD  predniSONE (DELTASONE) 20 MG tablet Take 3 tabs daily for 1 week, then 2 tabs daily for week 2, then 1 tab daily for week 3. Patient not taking: Reported on 03/22/2022 02/24/22   Dettinger, Fransisca Kaufmann, MD  Semaglutide (RYBELSUS) 7 MG TABS Take 7 mg by mouth daily. 01/06/22   Dettinger, Fransisca Kaufmann, MD  Vitamin D, Ergocalciferol, (DRISDOL) 1.25 MG (50000 UNIT) CAPS capsule Take 1 capsule  (50,000 Units total) by mouth every 7 (seven) days. 05/15/21   Dettinger, Fransisca Kaufmann, MD      Allergies    Ativan [lorazepam], Blueberry [vaccinium angustifolium], Fish oil, Glucosamine forte [nutritional supplements], Shellfish allergy, and Ultram [tramadol hcl]    Review of Systems   Review of Systems  Constitutional:  Positive for fatigue.  Cardiovascular:  Positive for palpitations.  Neurological:  Positive for weakness (Generalized).  All other systems reviewed and are negative.   Physical Exam Updated Vital Signs BP 132/82 (BP Location: Right Arm)   Pulse 65   Temp 98.2 F (36.8 C) (Oral)   Resp 18   Ht '5\' 9"'$  (1.753 m)   Wt 86.6 kg   SpO2 97%   BMI 28.21 kg/m  Physical Exam Vitals and nursing note reviewed.  Constitutional:      General: He is not in acute distress.    Appearance: Normal appearance. He is well-developed. He is not ill-appearing, toxic-appearing or diaphoretic.  HENT:     Head: Normocephalic and atraumatic.     Right Ear: External ear normal.     Left Ear: External ear normal.     Nose: Nose normal.     Mouth/Throat:     Mouth: Mucous membranes are moist.     Pharynx: Oropharynx is clear.  Eyes:     Extraocular Movements: Extraocular movements intact.     Conjunctiva/sclera: Conjunctivae normal.  Cardiovascular:     Rate and Rhythm: Normal rate and regular rhythm.     Heart sounds: No murmur heard. Pulmonary:     Effort: Pulmonary effort is normal. No respiratory distress.     Breath sounds: Normal breath sounds. No wheezing or rales.  Chest:     Chest wall: No tenderness.  Abdominal:     General: There is no distension.     Palpations: Abdomen is soft.     Tenderness: There is no abdominal tenderness.  Musculoskeletal:        General: No swelling. Normal range of motion.     Cervical back: Normal range of motion and neck supple.     Right lower leg: No edema.     Left lower leg: No edema.  Skin:    General: Skin is warm and dry.      Capillary Refill: Capillary refill takes less than 2 seconds.     Coloration: Skin is not jaundiced or pale.  Neurological:     General: No focal deficit present.     Mental Status: He is alert and oriented to person, place, and time.     Cranial Nerves: No cranial nerve deficit.     Sensory: No sensory deficit.     Motor: No weakness.     Coordination: Coordination normal.  Psychiatric:        Mood and Affect: Mood normal.        Behavior: Behavior normal.        Thought Content: Thought content normal.        Judgment: Judgment normal.     ED Results / Procedures / Treatments   Labs (all labs ordered are listed, but only abnormal results are displayed) Labs Reviewed  COMPREHENSIVE METABOLIC PANEL - Abnormal; Notable for the following components:      Result Value   CO2 21 (*)    Glucose, Bld 165 (*)    Calcium 10.8 (*)    Total Bilirubin 1.5 (*)    All other components within normal limits  CBC WITH DIFFERENTIAL/PLATELET - Abnormal; Notable for the following components:   WBC 11.4 (*)    All other components within normal limits  URINALYSIS, ROUTINE W REFLEX MICROSCOPIC - Abnormal; Notable for the following components:   Ketones, ur 5 (*)    Protein, ur 30 (*)    All other components within normal limits  I-STAT VENOUS BLOOD GAS, ED - Abnormal; Notable for the following components:   pCO2, Ven 32.5 (*)    Acid-base deficit 3.0 (*)    All other components within normal limits  RESP PANEL BY RT-PCR (FLU A&B, COVID) ARPGX2  CBG MONITORING, ED  TROPONIN I (HIGH SENSITIVITY)  TROPONIN I (HIGH SENSITIVITY)    EKG EKG Interpretation  Date/Time:  Monday March 22 2022 22:46:24 EST Ventricular Rate:  93 PR Interval:  128 QRS Duration: 84 QT Interval:  364 QTC Calculation: 452 R Axis:   -14 Text Interpretation: Normal sinus rhythm Confirmed by Godfrey Pick ([id number]) on 03/23/2022 7:50:09 AM  Radiology No results found.  Procedures Procedures    Medications Ordered  in ED Medications  sodium chloride 0.9 % bolus 2,000 mL (0 mLs Intravenous Stopped 03/23/22 1323)    ED Course/ Medical Decision Making/ A&P                           Medical Decision Making  This patient presents to the ED for  concern of fatigue and generalized weakness, this involves an extensive number of treatment options, and is a complaint that carries with it a high risk of complications and morbidity.  The differential diagnosis includes cardiac arrhythmia, infection, dehydration, metabolic derangements   Co morbidities that complicate the patient evaluation  HTN, DM, panic disorder, HLD   Additional history obtained:  Additional history obtained from N/A External records from outside source obtained and reviewed including EMR   Lab Tests:  I Ordered, and personally interpreted labs.  The pertinent results include: Mild leukocytosis without neutrophilia, mildly elevated blood glucose, mild hypercalcemia (improved from baseline), normal troponins x 2   Imaging Studies ordered:  I ordered imaging studies including CTA chest I independently visualized and interpreted imaging which showed (patient declined) I agree with the radiologist interpretation   Cardiac Monitoring: / EKG:  The patient was maintained on a cardiac monitor.  I personally viewed and interpreted the cardiac monitored which showed an underlying rhythm of: Sinus rhythm  Problem List / ED Course / Critical interventions / Medication management  Patient presents for fatigue, generalized weakness and intermittent palpitations over the past 5 days.  Yesterday, he was experiencing diaphoresis.  He has had poor p.o. intake.  He has had intermittent lightheadedness and dizziness.  He was seen at PCPs office yesterday.  Heart rate at that time was 141.  Blood pressure was normal.  EKG at PCPs office showed sinus tachycardia.  Patient arrived in the ED yesterday.  Initial EKG also showed sinus tachycardia.  EKG, 5  hours later, showed normal heart rate.  Diagnostic workup, obtained prior to patient being bedded in the ED, shows mild leukocytosis, mild hypercalcemia (although improved from baseline), mild hyperglycemia, normal electrolytes, and normal troponin.  On assessment, patient is well-appearing.  Heart rate and rhythm remain normal.  His breathing is unlabored.  He is understandably agitated about his long ED waiting room time.  He was informed of his reassuring lab work results.  Patient does chart his blood pressures, heart rate, and blood sugars at home.  I did review these.  He has not had any recent hypotension.  His blood glucose has been mildly elevated, intermittently, over the past few days.  He denies any polyuria.  He states that he typically tries to stay hydrated.  On his urine today, there is mild glucosuria and ketonuria.  Patient's episodes of tachycardia may have been, in part, been secondary to dehydration.  IV fluids were ordered.  A CTA of chest was also ordered for possible PE.  Given normal troponins, suspicion of this is low.  Patient was initially agreeable to this but subsequently stated that he does not want it.  He remained perturbed regarding care in the ED.  He was ultimately agreeable to placement of an IV with IV fluids.  His heart rate remained normal throughout the remainder of his ED observation.  He is able to ambulate without any new symptoms.  Given the EKGs that were captured when he was tachycardic, showing sinus rhythm, and resolution of this prior to any IV fluid intervention, I suspect there may be an anxiety component.  Patient does have a documented history of panic disorder.  Following IV fluids, patient reported that his fatigue was "10% better".  I do feel that he is stable for discharge.  He was advised to continue following up with his primary care doctor for ongoing management of his chronic conditions and to return to the ED for any worsening of  symptoms.  He was also  provided with contact information to establish cardiology care.  He was discharged in good condition. I ordered medication including IV fluids for hydration Reevaluation of the patient after these medicines showed that the patient improved I have reviewed the patients home medicines and have made adjustments as needed   Social Determinants of Health:  Has PCP         Final Clinical Impression(s) / ED Diagnoses Final diagnoses:  Generalized weakness    Rx / DC Orders ED Discharge Orders     None         Godfrey Pick, MD 03/23/22 1616

## 2022-03-25 ENCOUNTER — Ambulatory Visit: Payer: Medicare HMO

## 2022-03-26 ENCOUNTER — Ambulatory Visit (INDEPENDENT_AMBULATORY_CARE_PROVIDER_SITE_OTHER): Payer: Medicare HMO | Admitting: Family Medicine

## 2022-03-26 ENCOUNTER — Encounter: Payer: Self-pay | Admitting: Family Medicine

## 2022-03-26 VITALS — BP 126/79 | HR 114 | Temp 97.3°F | Ht 69.0 in | Wt 196.0 lb

## 2022-03-26 DIAGNOSIS — R531 Weakness: Secondary | ICD-10-CM | POA: Diagnosis not present

## 2022-03-26 DIAGNOSIS — R0609 Other forms of dyspnea: Secondary | ICD-10-CM

## 2022-03-26 DIAGNOSIS — R231 Pallor: Secondary | ICD-10-CM

## 2022-03-26 DIAGNOSIS — R000 Tachycardia, unspecified: Secondary | ICD-10-CM | POA: Diagnosis not present

## 2022-03-26 NOTE — Progress Notes (Signed)
BP 126/79   Pulse (!) 114   Temp (!) 97.3 F (36.3 C)   Ht _0  (1.753 m)   Wt 196 lb (88.9 kg)   SpO2 99%   BMI 28.94 kg/m    Subjective:   Patient ID: Philip [name], male    DOB: Jan 16, 1951, 71 y.o.   MRN: [id number]  HPI: Philip [name] is a 71 y.o. male presenting on 03/26/2022 for Medical Management of Chronic Issues and Diabetes   HPI ER follow-up Patient is coming in today for ER follow-up for fatigue and weakness and decreased energy and feeling clammy feeling weak especially on exertion.  He went to the ER feeling especially bad from our office where he was sent there and they gave him fluids and did cardiac workup and found his troponins and EKG and everything looked better.  He was no longer as tachycardic as he was initially.  He has been trying to increase fluids to keep that going.  He says his blood sugars are still somewhat elevated.  He is still taking Rybelsus and metformin for that.  We also given sample for [place] that he can use 5 units and he has not started that but he will soon.  He just feels like he is so fatigued and drained and cannot do nearly anything.  He does not know why this is all of a sudden come on.  Relevant past medical, surgical, family and social history reviewed and updated as indicated. Interim medical history since our last visit reviewed. Allergies and medications reviewed and updated.  Review of Systems  Constitutional:  Positive for fatigue. Negative for chills and fever.  HENT:  Negative for congestion.   Eyes:  Negative for visual disturbance.  Respiratory:  Positive for shortness of breath. Negative for cough, chest tightness and wheezing.   Cardiovascular:  Positive for palpitations. Negative for chest pain and leg swelling.  Gastrointestinal:  Negative for abdominal pain, diarrhea, nausea and vomiting.  Musculoskeletal:  Negative for back pain and gait problem.  Skin:  Negative for rash.  Neurological:  Positive for weakness.  All  other systems reviewed and are negative.   Per HPI unless specifically indicated above   Allergies as of 03/26/2022       Reactions   Ativan [lorazepam] Other (See Comments)   Caused patient not to remember several days.   Blueberry [vaccinium Angustifolium] Swelling   Fish Oil    Glucosamine Forte [nutritional Supplements]    Weak, flu like symptoms   Shellfish Allergy    Ultram [tramadol Hcl] Hives        Medication List        Accurate as of March 26, 2022  4:31 PM. If you have any questions, ask your nurse or doctor.          STOP taking these medications    predniSONE 20 MG tablet Commonly known as: DELTASONE Stopped by: Fransisca Kaufmann Izaia Say, MD       TAKE these medications    aspirin EC 81 MG tablet Take 81 mg by mouth in the morning and at bedtime.   atorvastatin 40 MG tablet Commonly known as: LIPITOR Take 1 tablet (40 mg total) by mouth daily.   cyclobenzaprine 10 MG tablet Commonly known as: FLEXERIL Take 1 tablet (10 mg total) by mouth 3 (three) times daily as needed for muscle spasms.   lisinopril 20 MG tablet Commonly known as: ZESTRIL Take 1 tablet (20 mg total) by mouth daily.  metFORMIN 1000 MG tablet Commonly known as: GLUCOPHAGE Take 1 tablet (1,000 mg total) by mouth 2 (two) times daily with a meal.   Rybelsus 7 MG Tabs Generic drug: Semaglutide Take 7 mg by mouth daily.   Vitamin D (Ergocalciferol) 1.25 MG (50000 UNIT) Caps capsule Commonly known as: DRISDOL Take 1 capsule (50,000 Units total) by mouth every 7 (seven) days.         Objective:   BP 126/79   Pulse (!) 114   Temp (!) 97.3 F (36.3 C)   Ht _0  (1.753 m)   Wt 196 lb (88.9 kg)   SpO2 99%   BMI 28.94 kg/m   Wt Readings from Last 3 Encounters:  03/26/22 196 lb (88.9 kg)  03/22/22 191 lb (86.6 kg)  03/22/22 196 lb (88.9 kg)    Physical Exam Vitals and nursing note reviewed.  Constitutional:      General: He is not in acute distress.     Appearance: He is well-developed. He is not diaphoretic.  Eyes:     General: No scleral icterus.    Conjunctiva/sclera: Conjunctivae normal.  Neck:     Thyroid: No thyromegaly.  Cardiovascular:     Rate and Rhythm: Regular rhythm. Tachycardia present.     Heart sounds: Normal heart sounds. No murmur heard. Pulmonary:     Effort: Pulmonary effort is normal. No respiratory distress.     Breath sounds: Normal breath sounds. No wheezing, rhonchi or rales.  Musculoskeletal:        General: Normal range of motion.     Cervical back: Neck supple.  Lymphadenopathy:     Cervical: No cervical adenopathy.  Skin:    General: Skin is warm and dry.     Findings: No rash.  Neurological:     Mental Status: He is alert and oriented to person, place, and time.     Coordination: Coordination normal.  Psychiatric:        Behavior: Behavior normal.       Assessment & Plan:   Problem List Items Addressed This Visit   None Visit Diagnoses     Tachycardia    -  Primary   Relevant Orders   CBC with Differential/Platelet   CMP14+EGFR   Ambulatory referral to Cardiology   Clammy skin       Relevant Orders   CBC with Differential/Platelet   CMP14+EGFR   Ambulatory referral to Cardiology   Weak       Relevant Orders   CBC with Differential/Platelet   CMP14+EGFR   Ambulatory referral to Cardiology   Exertional dyspnea       Relevant Orders   CBC with Differential/Platelet   CMP14+EGFR   Ambulatory referral to Cardiology     Will refer to cardiology to rule out any possibly underlying cardiac disease, he did get an emergent cardiac workup which came back negative for troponins and EKG was good.  He was given fluids and he is feeling somewhat better but still not feeling like himself.  Follow up plan: Return if symptoms worsen or fail to improve, for 4 to 5 weeks diabetes recheck.  Counseling provided for all of the vaccine components Orders Placed This Encounter  Procedures   CBC  with Differential/Platelet   CMP14+EGFR   Ambulatory referral to Cardiology    Caryl Pina, MD [facility] 03/26/2022, 4:31 PM

## 2022-03-27 LAB — CMP14+EGFR
ALT: 14 IU/L (ref 0–44)
AST: 19 IU/L (ref 0–40)
Albumin/Globulin Ratio: 1.9 (ref 1.2–2.2)
Albumin: 4.4 g/dL (ref 3.9–4.9)
Alkaline Phosphatase: 87 IU/L (ref 44–121)
BUN/Creatinine Ratio: 11 (ref 10–24)
BUN: 10 mg/dL (ref 8–27)
Bilirubin Total: 0.6 mg/dL (ref 0.0–1.2)
CO2: 19 mmol/L — ABNORMAL LOW (ref 20–29)
Calcium: 10.7 mg/dL — ABNORMAL HIGH (ref 8.6–10.2)
Chloride: 105 mmol/L (ref 96–106)
Creatinine, Ser: 0.94 mg/dL (ref 0.76–1.27)
Globulin, Total: 2.3 g/dL (ref 1.5–4.5)
Glucose: 153 mg/dL — ABNORMAL HIGH (ref 70–99)
Potassium: 4.2 mmol/L (ref 3.5–5.2)
Sodium: 139 mmol/L (ref 134–144)
Total Protein: 6.7 g/dL (ref 6.0–8.5)
eGFR: 87 mL/min/{1.73_m2} (ref >59)

## 2022-03-27 LAB — CBC WITH DIFFERENTIAL/PLATELET
Basophils Absolute: 0 10*3/uL (ref 0.0–0.2)
Basos: 0 %
EOS (ABSOLUTE): 0.1 10*3/uL (ref 0.0–0.4)
Eos: 2 %
Hematocrit: 39 % (ref 37.5–51.0)
Hemoglobin: 13.6 g/dL (ref 13.0–17.7)
Immature Grans (Abs): 0 10*3/uL (ref 0.0–0.1)
Immature Granulocytes: 0 %
Lymphocytes Absolute: 2.1 10*3/uL (ref 0.7–3.1)
Lymphs: 26 %
MCH: 31.7 pg (ref 26.6–33.0)
MCHC: 34.9 g/dL (ref 31.5–35.7)
MCV: 91 fL (ref 79–97)
Monocytes Absolute: 0.8 10*3/uL (ref 0.1–0.9)
Monocytes: 10 %
Neutrophils Absolute: 5 10*3/uL (ref 1.4–7.0)
Neutrophils: 62 %
Platelets: 279 10*3/uL (ref 150–450)
RBC: 4.29 x10E6/uL (ref 4.14–5.80)
RDW: 12.9 % (ref 11.6–15.4)
WBC: 8 10*3/uL (ref 3.4–10.8)

## 2022-03-31 ENCOUNTER — Telehealth: Payer: Self-pay

## 2022-03-31 NOTE — Telephone Encounter (Signed)
     Patient  visit on 12/5  at [facility]  Have you been able to follow up with your primary care physician? Yes   The patient was or was not able to obtain any needed medicine or equipment. Yes   Are there diet recommendations that you are having difficulty following? na   Patient expresses understanding of discharge instructions and education provided has no other needs at this time. Yes     [place], [facility], Care Management  [phone] [address], [place], [place] [address] Phone: [phone] Email: [email or web address]

## 2022-04-05 ENCOUNTER — Ambulatory Visit (INDEPENDENT_AMBULATORY_CARE_PROVIDER_SITE_OTHER): Payer: Medicare HMO

## 2022-04-05 VITALS — Ht 69.0 in | Wt 196.0 lb

## 2022-04-05 DIAGNOSIS — Z0000 Encounter for general adult medical examination without abnormal findings: Secondary | ICD-10-CM | POA: Diagnosis not present

## 2022-04-05 NOTE — Patient Instructions (Signed)
[name] , Thank you for taking time to come for your Medicare Wellness Visit. I appreciate your ongoing commitment to your health goals. Please review the following plan we discussed and let me know if I can assist you in the future.   These are the goals we discussed:  Goals       Exercise 3x per week (30 min per time)      Patient Stated      04/01/2020 AWV Goal: Fall Prevention  Over the next year, patient will decrease their risk for falls by: Using assistive devices, such as a cane or walker, as needed Identifying fall risks within their home and correcting them by: Removing throw rugs Adding handrails to stairs or ramps Removing clutter and keeping a clear pathway throughout the home Increasing light, especially at night Adding shower handles/bars Raising toilet seat Identifying potential personal risk factors for falls: Medication side effects Incontinence/urgency Vestibular dysfunction Hearing loss Musculoskeletal disorders Neurological disorders Orthostatic hypotension        Quit Smoking      T2DM, HLD PHARMD GOAL (pt-stated)      Current Barriers:  Unable to independently afford treatment regimen Unable to achieve control of T2DM ; mostly due to cost and diet/lifestyle  Pharmacist Clinical Goal(s):  Over the next 90 days, patient will verbalize ability to afford treatment regimen achieve adherence to monitoring guidelines and medication adherence to achieve therapeutic efficacy achieve control of t2dm as evidenced by goal A1c<7% through collaboration with PharmD and provider.   Interventions: 1:1 collaboration with Philip [name], Philip Kaufmann, MD regarding development and update of comprehensive plan of care as evidenced by provider attestation and co-signature Inter-disciplinary care team collaboration (see longitudinal plan of care) Comprehensive medication review performed; medication list updated in electronic medical record  Diabetes: Controlled; current  treatment: rybelsus '7mg'$ ; metformin GFR 86 On statin, on ACEi Denies personal and family history of Medullary thyroid cancer (MTC) Application submitted for Novo nordisk patient assistance 2023 (rybelsus) Continue current therapy (last steroid injection was 03/2021) Refills given for BP and HLD meds Current glucose readings: fasting glucose: <150, post prandial glucose: n/a Denies/reports hypoglycemic/hyperglycemic symptoms Current meal patterns: breakfast:  Discussed meal planning options and Plate method for healthy eating Avoid sugary drinks and desserts Incorporate balanced protein, non starchy veggies, 1 serving of carbohydrate with each meal Increase water intake Increase physical activity as able Current exercise: n/a Counseled on rybelsus, dietary changes Assessed patient finances. Patient re-enrolled in novo nordisk patient assistance program for 2023; patient picked up last supply 03/2021 -Hyperlipidemia-->education provided on heart healthy diet, goal LDL<70, continue current medicine, patient active/exercises  Patient Goals/Self-Care Activities Over the next 90 days, patient will:  - take medications as prescribed check glucose daily (fasting), document, and provide at future appointments  Follow Up Plan: Telephone follow up appointment with care management team member scheduled for: 06/2021         This is a list of the screening recommended for you and due dates:  Health Maintenance  Topic Date Due   Pneumonia Vaccine (1 - PCV) Never done   DTaP/Tdap/Td vaccine (1 - Tdap) Never done   Zoster (Shingles) Vaccine (1 of 2) Never done   Screening for Lung Cancer  09/23/2011   Yearly kidney health urinalysis for diabetes  04/07/2022*   Hepatitis C Screening: USPSTF Recommendation to screen - Ages 18-79 yo.  07/03/2022*   Eye exam for diabetics  07/05/2022*   Flu Shot  07/18/2022*   Hemoglobin A1C  07/07/2022   Complete foot exam   01/07/2023   Yearly kidney function  blood test for diabetes  03/27/2023   Medicare Annual Wellness Visit  04/06/2023   HPV Vaccine  Aged Out   Colon Cancer Screening  Discontinued   COVID-19 Vaccine  Discontinued  *Topic was postponed. The date shown is not the original due date.    Advanced directives: Advance directive discussed with you today. I have provided a copy for you to complete at home and have notarized. Once this is complete please bring a copy in to our office so we can scan it into your chart.   Conditions/risks identified: Aim for 30 minutes of exercise or brisk walking, 6-8 glasses of water, and 5 servings of fruits and vegetables each day.   Next appointment: Follow up in one year for your annual wellness visit.   Preventive Care 70 Years and Older, Male  Preventive care refers to lifestyle choices and visits with your health care provider that can promote health and wellness. What does preventive care include? A yearly physical exam. This is also called an annual well check. Dental exams once or twice a year. Routine eye exams. Ask your health care provider how often you should have your eyes checked. Personal lifestyle choices, including: Daily care of your teeth and gums. Regular physical activity. Eating a healthy diet. Avoiding tobacco and drug use. Limiting alcohol use. Practicing safe sex. Taking low doses of aspirin every day. Taking vitamin and mineral supplements as recommended by your health care provider. What happens during an annual well check? The services and screenings done by your health care provider during your annual well check will depend on your age, overall health, lifestyle risk factors, and family history of disease. Counseling  Your health care provider may ask you questions about your: Alcohol use. Tobacco use. Drug use. Emotional well-being. Home and relationship well-being. Sexual activity. Eating habits. History of falls. Memory and ability to understand  (cognition). Work and work Statistician. Screening  You may have the following tests or measurements: Height, weight, and BMI. Blood pressure. Lipid and cholesterol levels. These may be checked every 5 years, or more frequently if you are over 74 years old. Skin check. Lung cancer screening. You may have this screening every year starting at age 71 if you have a 30-pack-year history of smoking and currently smoke or have quit within the past 15 years. Fecal occult blood test (FOBT) of the stool. You may have this test every year starting at age 45. Flexible sigmoidoscopy or colonoscopy. You may have a sigmoidoscopy every 5 years or a colonoscopy every 10 years starting at age 74. Prostate cancer screening. Recommendations will vary depending on your family history and other risks. Hepatitis C blood test. Hepatitis B blood test. Sexually transmitted disease (STD) testing. Diabetes screening. This is done by checking your blood sugar (glucose) after you have not eaten for a while (fasting). You may have this done every 1-3 years. Abdominal aortic aneurysm (AAA) screening. You may need this if you are a current or former smoker. Osteoporosis. You may be screened starting at age 44 if you are at high risk. Talk with your health care provider about your test results, treatment options, and if necessary, the need for more tests. Vaccines  Your health care provider may recommend certain vaccines, such as: Influenza vaccine. This is recommended every year. Tetanus, diphtheria, and acellular pertussis (Tdap, Td) vaccine. You may need a Td booster every 10 years. Zoster  vaccine. You may need this after age 50. Pneumococcal 13-valent conjugate (PCV13) vaccine. One dose is recommended after age 54. Pneumococcal polysaccharide (PPSV23) vaccine. One dose is recommended after age 68. Talk to your health care provider about which screenings and vaccines you need and how often you need them. This  information is not intended to replace advice given to you by your health care provider. Make sure you discuss any questions you have with your health care provider. Document Released: 05/02/2015 Document Revised: 12/24/2015 Document Reviewed: 02/04/2015 Elsevier Interactive Patient Education  2017 [place] Prevention in the Home Falls can cause injuries. They can happen to people of all ages. There are many things you can do to make your home safe and to help prevent falls. What can I do on the outside of my home? Regularly fix the edges of walkways and driveways and fix any cracks. Remove anything that might make you trip as you walk through a door, such as a raised step or threshold. Trim any bushes or trees on the path to your home. Use bright outdoor lighting. Clear any walking paths of anything that might make someone trip, such as rocks or tools. Regularly check to see if handrails are loose or broken. Make sure that both sides of any steps have handrails. Any raised decks and porches should have guardrails on the edges. Have any leaves, snow, or ice cleared regularly. Use sand or salt on walking paths during winter. Clean up any spills in your garage right away. This includes oil or grease spills. What can I do in the bathroom? Use night lights. Install grab bars by the toilet and in the tub and shower. Do not use towel bars as grab bars. Use non-skid mats or decals in the tub or shower. If you need to sit down in the shower, use a plastic, non-slip stool. Keep the floor dry. Clean up any water that spills on the floor as soon as it happens. Remove soap buildup in the tub or shower regularly. Attach bath mats securely with double-sided non-slip rug tape. Do not have throw rugs and other things on the floor that can make you trip. What can I do in the bedroom? Use night lights. Make sure that you have a light by your bed that is easy to reach. Do not use any sheets or  blankets that are too big for your bed. They should not hang down onto the floor. Have a firm chair that has side arms. You can use this for support while you get dressed. Do not have throw rugs and other things on the floor that can make you trip. What can I do in the kitchen? Clean up any spills right away. Avoid walking on wet floors. Keep items that you use a lot in easy-to-reach places. If you need to reach something above you, use a strong step stool that has a grab bar. Keep electrical cords out of the way. Do not use floor polish or wax that makes floors slippery. If you must use wax, use non-skid floor wax. Do not have throw rugs and other things on the floor that can make you trip. What can I do with my stairs? Do not leave any items on the stairs. Make sure that there are handrails on both sides of the stairs and use them. Fix handrails that are broken or loose. Make sure that handrails are as long as the stairways. Check any carpeting to make sure that it  is firmly attached to the stairs. Fix any carpet that is loose or worn. Avoid having throw rugs at the top or bottom of the stairs. If you do have throw rugs, attach them to the floor with carpet tape. Make sure that you have a light switch at the top of the stairs and the bottom of the stairs. If you do not have them, ask someone to add them for you. What else can I do to help prevent falls? Wear shoes that: Do not have high heels. Have rubber bottoms. Are comfortable and fit you well. Are closed at the toe. Do not wear sandals. If you use a stepladder: Make sure that it is fully opened. Do not climb a closed stepladder. Make sure that both sides of the stepladder are locked into place. Ask someone to hold it for you, if possible. Clearly mark and make sure that you can see: Any grab bars or handrails. First and last steps. Where the edge of each step is. Use tools that help you move around (mobility aids) if they are  needed. These include: Canes. Walkers. Scooters. Crutches. Turn on the lights when you go into a dark area. Replace any light bulbs as soon as they burn out. Set up your furniture so you have a clear path. Avoid moving your furniture around. If any of your floors are uneven, fix them. If there are any pets around you, be aware of where they are. Review your medicines with your doctor. Some medicines can make you feel dizzy. This can increase your chance of falling. Ask your doctor what other things that you can do to help prevent falls. This information is not intended to replace advice given to you by your health care provider. Make sure you discuss any questions you have with your health care provider. Document Released: 01/30/2009 Document Revised: 09/11/2015 Document Reviewed: 05/10/2014 Elsevier Interactive Patient Education  2017 [employer].

## 2022-04-05 NOTE — Progress Notes (Signed)
Subjective:   Philip [name] is a 71 y.o. male who presents for Medicare Annual/Subsequent preventive examination. I connected with  Philip [name] on 04/05/22 by a audio enabled telemedicine application and verified that I am speaking with the correct person using two identifiers.  Patient Location: Home  Provider Location: Home Office  I discussed the limitations of evaluation and management by telemedicine. The patient expressed understanding and agreed to proceed.  Review of Systems     Cardiac Risk Factors include: advanced age (>80mn, >>57women);male gender;diabetes mellitus;dyslipidemia;hypertension     Objective:    Today's Vitals   04/05/22 1205  Weight: 196 lb (88.9 kg)  Height: _0  (1.753 m)   Body mass index is 28.94 kg/m.     04/05/2022   12:11 PM 03/22/2022    5:30 PM 04/03/2021    9:02 AM 04/01/2020    8:35 AM 08/29/2019    8:34 PM 08/14/2019    2:00 AM 08/13/2019    4:59 PM  Advanced Directives  Does Patient Have a Medical Advance Directive? _1  No No  Would patient like information on creating a medical advance directive? No - Patient declined No - Patient declined No - Patient declined No - Patient declined  No - Patient declined     Current Medications (verified) Outpatient Encounter Medications as of 04/05/2022  Medication Sig   aspirin EC 81 MG tablet Take 81 mg by mouth in the morning and at bedtime.    atorvastatin (LIPITOR) 40 MG tablet Take 1 tablet (40 mg total) by mouth daily.   cyclobenzaprine (FLEXERIL) 10 MG tablet Take 1 tablet (10 mg total) by mouth 3 (three) times daily as needed for muscle spasms.   lisinopril (ZESTRIL) 20 MG tablet Take 1 tablet (20 mg total) by mouth daily.   metFORMIN (GLUCOPHAGE) 1000 MG tablet Take 1 tablet (1,000 mg total) by mouth 2 (two) times daily with a meal.   Semaglutide (RYBELSUS) 7 MG TABS Take 7 mg by mouth daily.   Vitamin D, Ergocalciferol, (DRISDOL) 1.25 MG (50000 UNIT) CAPS capsule Take 1  capsule (50,000 Units total) by mouth every 7 (seven) days.   No facility-administered encounter medications on file as of 04/05/2022.    Allergies (verified) Ativan [lorazepam], Blueberry [vaccinium angustifolium], Fish oil, Glucosamine forte [nutritional supplements], Shellfish allergy, and Ultram [tramadol hcl]   History: Past Medical History:  Diagnosis Date   Chest pain    Diabetes mellitus without complication ([place]    Hyperlipidemia    Hypertension    PTSD (post-traumatic stress disorder)    Past Surgical History:  Procedure Laterality Date   [place]  hernitated and ruptured discs   LUMBAR LAMINECTOMY/DECOMPRESSION MICRODISCECTOMY Right 08/01/2014   Procedure: Right L/4-5 Extraforaminal Diskectomy;  Surgeon: DEustace Moore MD;  Location: [facility];  Service: Neurosurgery;  Laterality: Right;  Right L/4-5 Extraforaminal Diskectomy   TONSILLECTOMY     TONSILLECTOMY     Family History  Adopted: Yes   Social History   Socioeconomic History   Marital status: Single    Spouse name: Not on file   Number of children: Not on file   Years of education: senior college   Highest education level: Bachelor's degree (e.g., BA, AB, BS)  Occupational History   Occupation: Retired  Tobacco Use   Smoking status: Some Days    Packs/day: 0.50    Years: 53.00    Total pack years: 26.50    Types:  Cigarettes   Smokeless tobacco: Never  Vaping Use   Vaping Use: Never used  Substance and Sexual Activity   Alcohol use: No   Drug use: No   Sexual activity: Not Currently  Other Topics Concern   Not on file  Social History Narrative   Not on file   Social Determinants of Health   Financial Resource Strain: Low Risk  (04/05/2022)   Overall Financial Resource Strain (CARDIA)    Difficulty of Paying Living Expenses: Not hard at all  Food Insecurity: No Food Insecurity (04/05/2022)   Hunger Vital Sign    Worried About Running Out of Food in the Last Year: Never true     Ran Out of Food in the Last Year: Never true  Transportation Needs: No Transportation Needs (04/05/2022)   PRAPARE - Hydrologist (Medical): No    Lack of Transportation (Non-Medical): No  Physical Activity: Insufficiently Active (04/05/2022)   Exercise Vital Sign    Days of Exercise per Week: 3 days    Minutes of Exercise per Session: 30 min  Stress: No Stress Concern Present (04/05/2022)   [place]    Feeling of Stress : Not at all  Social Connections: Moderately Isolated (04/05/2022)   Social Connection and Isolation Panel [NHANES]    Frequency of Communication with Friends and Family: Twice a week    Frequency of Social Gatherings with Friends and Family: Twice a week    Attends Religious Services: Never    Marine scientist or Organizations: Yes    Attends Music therapist: More than 4 times per year    Marital Status: Divorced    Tobacco Counseling Ready to quit: No Counseling given: Not Answered   Clinical Intake:  Pre-visit preparation completed: Yes  Pain : No/denies pain     Nutritional Risks: None Diabetes: No  How often do you need to have someone help you when you read instructions, pamphlets, or other written materials from your doctor or pharmacy?: 1 - Never  Diabetic?yes  Nutrition Risk Assessment:  Has the patient had any N/V/D within the last 2 months?  No  Does the patient have any non-healing wounds?  No  Has the patient had any unintentional weight loss or weight gain?  No   Diabetes:  Is the patient diabetic?  Yes  If diabetic, was a CBG obtained today?  No  Did the patient bring in their glucometer from home?  No  How often do you monitor your CBG's? Daily .   Financial Strains and Diabetes Management:  Are you having any financial strains with the device, your supplies or your medication? No .  Does the patient want to  be seen by Chronic Care Management for management of their diabetes?  No  Would the patient like to be referred to a Nutritionist or for Diabetic Management?  No   Diabetic Exams:  Diabetic Eye Exam: Completed 10/2021 Diabetic Foot Exam: Overdue, Pt has been advised about the importance in completing this exam. Pt is scheduled for diabetic foot exam on next office visit .   Interpreter Needed?: No  Information entered by :: Jadene Pierini, LPN   Activities of Daily Living    04/05/2022   12:11 PM  In your present state of health, do you have any difficulty performing the following activities:  Hearing? 0  Vision? 0  Difficulty concentrating or making decisions? 0  Walking  or climbing stairs? 0  Dressing or bathing? 0  Doing errands, shopping? 0  Preparing Food and eating ? N  Using the Toilet? N  In the past six months, have you accidently leaked urine? N  Do you have problems with loss of bowel control? N  Managing your Medications? N  Managing your Finances? N  Housekeeping or managing your Housekeeping? N    Patient Care Team: Dettinger, Fransisca Kaufmann, MD as PCP - General (Family Medicine) Lavera Guise, [facility] (Pharmacist)  Indicate any recent Medical Services you may have received from other than [facility] providers in the past year (date may be approximate).     Assessment:   This is a routine wellness examination for [place].  Hearing/Vision screen Vision Screening - Comments:: Wears rx glasses - up to date with routine eye exams with  Dr.Johnson   Dietary issues and exercise activities discussed: Current Exercise Habits: Home exercise routine, Type of exercise: walking, Time (Minutes): 30, Frequency (Times/Week): 3, Weekly Exercise (Minutes/Week): 90, Intensity: Mild, Exercise limited by: orthopedic condition(s)   Goals Addressed             This Visit's Progress    Exercise 3x per week (30 min per time)         Depression Screen    04/05/2022   12:10 PM  03/26/2022    3:59 PM 03/22/2022    3:55 PM 02/24/2022    1:18 PM 01/06/2022    2:37 PM 10/05/2021    1:17 PM 04/03/2021    9:01 AM  PHQ 2/9 Scores  PHQ - 2 Score 0 _0 PHQ- 9 Score 0 _1 Fall Risk    04/05/2022   12:07 PM 03/26/2022    3:59 PM 03/22/2022    3:54 PM 01/06/2022    2:37 PM 10/05/2021    1:17 PM  Fall Risk   Falls in the past year? 0 0 0 0 0  Number falls in past yr: 0      Injury with Fall? 0      Risk for fall due to : No Fall Risks      Follow up Falls prevention discussed  Falls evaluation completed      [place]:  Any stairs in or around the home? Yes  If so, are there any without handrails? No  Home free of loose throw rugs in walkways, pet beds, electrical cords, etc? Yes  Adequate lighting in your home to reduce risk of falls? Yes   ASSISTIVE DEVICES UTILIZED TO PREVENT FALLS:  Life alert? No  Use of a cane, walker or w/c? Yes  Grab bars in the bathroom? No  Shower chair or bench in shower? No  Elevated toilet seat or a handicapped toilet? No       12/23/2017    2:49 PM  MMSE - Mini Mental State Exam  Orientation to time 5  Orientation to Place 5  Registration 3  Attention/ Calculation 5  Recall 3  Language- name 2 objects 2  Language- repeat 1  Language- follow 3 step command 3  Language- read & follow direction 1  Write a sentence 1  Copy design 1  Total score 30        04/05/2022   12:12 PM 04/03/2021    8:26 AM 04/01/2020    8:39 AM  6CIT Screen  What Year? 0 points 0  points 0 points  What month? 0 points 0 points 0 points  What time? 0 points 0 points 0 points  Count back from 20 0 points 0 points 0 points  Months in reverse 0 points 0 points 0 points  Repeat phrase 0 points 0 points 0 points  Total Score 0 points 0 points 0 points    Immunizations  There is no immunization history on file for this patient.  TDAP status: Due, Education has been provided regarding  the importance of this vaccine. Advised may receive this vaccine at local pharmacy or Health Dept. Aware to provide a copy of the vaccination record if obtained from local pharmacy or Health Dept. Verbalized acceptance and understanding.  Flu Vaccine status: Declined, Education has been provided regarding the importance of this vaccine but patient still declined. Advised may receive this vaccine at local pharmacy or Health Dept. Aware to provide a copy of the vaccination record if obtained from local pharmacy or Health Dept. Verbalized acceptance and understanding.  Pneumococcal vaccine status: Declined,  Education has been provided regarding the importance of this vaccine but patient still declined. Advised may receive this vaccine at local pharmacy or Health Dept. Aware to provide a copy of the vaccination record if obtained from local pharmacy or Health Dept. Verbalized acceptance and understanding.   Covid-19 vaccine status: Declined, Education has been provided regarding the importance of this vaccine but patient still declined. Advised may receive this vaccine at local pharmacy or Health Dept.or vaccine clinic. Aware to provide a copy of the vaccination record if obtained from local pharmacy or Health Dept. Verbalized acceptance and understanding.  Qualifies for Shingles Vaccine? Yes   Zostavax completed No   Shingrix Completed?: No.    Education has been provided regarding the importance of this vaccine. Patient has been advised to call insurance company to determine out of pocket expense if they have not yet received this vaccine. Advised may also receive vaccine at local pharmacy or Health Dept. Verbalized acceptance and understanding.  Screening Tests Health Maintenance  Topic Date Due   Pneumonia Vaccine 70+ Years old (1 - PCV) Never done   DTaP/Tdap/Td (1 - Tdap) Never done   Zoster Vaccines- Shingrix (1 of 2) Never done   Lung Cancer Screening  09/23/2011   Diabetic kidney evaluation  - Urine ACR  04/07/2022 (Originally 10/20/2019)   Hepatitis C Screening  07/03/2022 (Originally 06/10/1968)   OPHTHALMOLOGY EXAM  07/05/2022 (Originally 09/12/2021)   INFLUENZA VACCINE  07/18/2022 (Originally 11/17/2021)   HEMOGLOBIN A1C  07/07/2022   FOOT EXAM  01/07/2023   Diabetic kidney evaluation - eGFR measurement  03/27/2023   Medicare Annual Wellness (AWV)  04/06/2023   HPV VACCINES  Aged Out   COLONOSCOPY (Pts 45-35yr Insurance coverage will need to be confirmed)  Discontinued   COVID-19 Vaccine  Discontinued    Health Maintenance  Health Maintenance Due  Topic Date Due   Pneumonia Vaccine 70 Years old (1 - PCV) Never done   DTaP/Tdap/Td (1 - Tdap) Never done   Zoster Vaccines- Shingrix (1 of 2) Never done   Lung Cancer Screening  09/23/2011    Colorectal cancer screening: Referral to GI placed Declined patient will discuss with PCP . Pt aware the office will call re: appt.  Lung Cancer Screening: (Low Dose CT Chest recommended if Age 70-80years, 30 pack-year currently smoking OR have quit w/in 15years.) does not qualify.   Lung Cancer Screening Referral: n/a  Additional Screening:  Hepatitis C Screening:  does not qualify;   Vision Screening: Recommended annual ophthalmology exams for early detection of glaucoma and other disorders of the eye. Is the patient up to date with their annual eye exam?  Yes  Who is the provider or what is the name of the office in which the patient attends annual eye exams? Dr.Johnson  If pt is not established with a provider, would they like to be referred to a provider to establish care? No .   Dental Screening: Recommended annual dental exams for proper oral hygiene  Community Resource Referral / Chronic Care Management: CRR required this visit?  No   CCM required this visit?  No      Plan:     I have personally reviewed and noted the following in the patient's chart:   Medical and social history Use of alcohol, tobacco or  illicit drugs  Current medications and supplements including opioid prescriptions. Patient is not currently taking opioid prescriptions. Functional ability and status Nutritional status Physical activity Advanced directives List of other physicians Hospitalizations, surgeries, and ER visits in previous 12 months Vitals Screenings to include cognitive, depression, and falls Referrals and appointments  In addition, I have reviewed and discussed with patient certain preventive protocols, quality metrics, and best practice recommendations. A written personalized care plan for preventive services as well as general preventive health recommendations were provided to patient.     Daphane Shepherd, LPN   [id number]   Nurse Notes: Patient will discuss Colonoscopy with PCP

## 2022-04-08 ENCOUNTER — Ambulatory Visit (INDEPENDENT_AMBULATORY_CARE_PROVIDER_SITE_OTHER): Payer: Medicare HMO | Admitting: Family Medicine

## 2022-04-08 ENCOUNTER — Encounter: Payer: Self-pay | Admitting: Family Medicine

## 2022-04-08 VITALS — BP 151/76 | HR 117 | Ht 69.0 in | Wt 195.0 lb

## 2022-04-08 DIAGNOSIS — I10 Essential (primary) hypertension: Secondary | ICD-10-CM

## 2022-04-08 DIAGNOSIS — R051 Acute cough: Secondary | ICD-10-CM

## 2022-04-08 DIAGNOSIS — R0989 Other specified symptoms and signs involving the circulatory and respiratory systems: Secondary | ICD-10-CM | POA: Diagnosis not present

## 2022-04-08 DIAGNOSIS — E119 Type 2 diabetes mellitus without complications: Secondary | ICD-10-CM | POA: Diagnosis not present

## 2022-04-08 DIAGNOSIS — E782 Mixed hyperlipidemia: Secondary | ICD-10-CM

## 2022-04-08 DIAGNOSIS — J189 Pneumonia, unspecified organism: Secondary | ICD-10-CM | POA: Diagnosis not present

## 2022-04-08 LAB — VERITOR FLU A/B WAIVED
Influenza A: NEGATIVE
Influenza B: NEGATIVE

## 2022-04-08 LAB — BAYER DCA HB A1C WAIVED: HB A1C (BAYER DCA - WAIVED): 9.4 % — ABNORMAL HIGH (ref 4.8–5.6)

## 2022-04-08 MED ORDER — AMOXICILLIN-POT CLAVULANATE 875-125 MG PO TABS: 1.0000 | ORAL_TABLET | Freq: Two times a day (BID) | ORAL | 0 refills | Status: DC

## 2022-04-08 NOTE — Progress Notes (Signed)
BP (!) 151/76   Pulse (!) 117   Ht '5\' 9"'$  (1.753 m)   Wt 195 lb (88.5 kg)   SpO2 97%   BMI 28.80 kg/m    Subjective:   Patient ID: Philip [name], male    DOB: 1950-07-04, 71 y.o.   MRN: [id number]  HPI: Philip [name] is a 71 y.o. male presenting on 04/08/2022 for Medical Management of Chronic Issues, Diabetes, and Cough (And chest congestion/OTC meds not helping)   HPI Type 2 diabetes mellitus Patient comes in today for recheck of his diabetes. Patient has been currently taking Rybelsus and metformin. Patient is currently on an ACE inhibitor/ARB. Patient has seen an ophthalmologist this year. Patient denies any issues with their feet. The symptom started onset as an adult hypertension and hyperlipidemia and cerebrovascular disease ARE RELATED TO DM   Hypertension Patient is currently on lisinopril, and their blood pressure today is 151/76, elevated likely because not feeling well. Patient denies any lightheadedness or dizziness. Patient denies headaches, blurred vision, chest pains, shortness of breath, or weakness. Denies any side effects from medication and is content with current medication.   Hyperlipidemia Patient is coming in for recheck of his hyperlipidemia. The patient is currently taking atorvastatin. They deny any issues with myalgias or history of liver damage from it. They deny any focal numbness or weakness or chest pain.   Cough and weakness and shortness of breath Patient has been having cough and congestion and achiness been going on for the past week.  He has been trying many over-the-counter things including DayQuil NyQuil and increasing fluids water and is just feels like he is getting a lot of congestion down his chest and is not improving.  Relevant past medical, surgical, family and social history reviewed and updated as indicated. Interim medical history since our last visit reviewed. Allergies and medications reviewed and updated.  Review of Systems   Constitutional:  Negative for chills and fever.  HENT:  Positive for congestion, postnasal drip, rhinorrhea, sinus pressure, sneezing and sore throat. Negative for ear discharge, ear pain and voice change.   Eyes:  Negative for pain, discharge, redness and visual disturbance.  Respiratory:  Positive for cough, shortness of breath and wheezing.   Cardiovascular:  Negative for chest pain and leg swelling.  Musculoskeletal:  Negative for back pain and gait problem.  Skin:  Negative for rash.  All other systems reviewed and are negative.   Per HPI unless specifically indicated above   Allergies as of 04/08/2022       Reactions   Ativan [lorazepam] Other (See Comments)   Caused patient not to remember several days.   Blueberry [vaccinium Angustifolium] Swelling   Fish Oil    Glucosamine Forte [nutritional Supplements]    Weak, flu like symptoms   Shellfish Allergy    Ultram [tramadol Hcl] Hives        Medication List        Accurate as of April 08, 2022 10:00 AM. If you have any questions, ask your nurse or doctor.          amoxicillin-clavulanate 875-125 MG tablet Commonly known as: AUGMENTIN Take 1 tablet by mouth 2 (two) times daily. Started by: Worthy Rancher, MD   aspirin EC 81 MG tablet Take 81 mg by mouth in the morning and at bedtime.   atorvastatin 40 MG tablet Commonly known as: LIPITOR Take 1 tablet (40 mg total) by mouth daily.   cyclobenzaprine 10 MG tablet Commonly known  as: FLEXERIL Take 1 tablet (10 mg total) by mouth 3 (three) times daily as needed for muscle spasms.   lisinopril 20 MG tablet Commonly known as: ZESTRIL Take 1 tablet (20 mg total) by mouth daily.   metFORMIN 1000 MG tablet Commonly known as: GLUCOPHAGE Take 1 tablet (1,000 mg total) by mouth 2 (two) times daily with a meal.   Rybelsus 7 MG Tabs Generic drug: Semaglutide Take 7 mg by mouth daily.   Vitamin D (Ergocalciferol) 1.25 MG (50000 UNIT) Caps  capsule Commonly known as: DRISDOL Take 1 capsule (50,000 Units total) by mouth every 7 (seven) days.         Objective:   BP (!) 151/76   Pulse (!) 117   Ht '5\' 9"'$  (1.753 m)   Wt 195 lb (88.5 kg)   SpO2 97%   BMI 28.80 kg/m   Wt Readings from Last 3 Encounters:  04/08/22 195 lb (88.5 kg)  04/05/22 196 lb (88.9 kg)  03/26/22 196 lb (88.9 kg)    Physical Exam Vitals and nursing note reviewed.  Constitutional:      General: He is not in acute distress.    Appearance: He is well-developed. He is not diaphoretic.  Eyes:     General: No scleral icterus.    Conjunctiva/sclera: Conjunctivae normal.  Neck:     Thyroid: No thyromegaly.  Cardiovascular:     Rate and Rhythm: Normal rate and regular rhythm.     Heart sounds: Normal heart sounds. No murmur heard. Pulmonary:     Effort: Pulmonary effort is normal. No respiratory distress.     Breath sounds: Rhonchi present. No wheezing or rales.  Chest:     Chest wall: No tenderness.  Musculoskeletal:        General: No swelling. Normal range of motion.     Cervical back: Neck supple.  Lymphadenopathy:     Cervical: No cervical adenopathy.  Skin:    General: Skin is warm and dry.     Findings: No rash.  Neurological:     Mental Status: He is alert and oriented to person, place, and time.     Coordination: Coordination normal.  Psychiatric:        Behavior: Behavior normal.       Assessment & Plan:   Problem List Items Addressed This Visit       Cardiovascular and Mediastinum   Hypertension     Endocrine   Diabetes ([place]) - Primary   Relevant Orders   Bayer DCA Hb A1c Waived     Other   Hyperlipidemia   Other Visit Diagnoses     Acute cough       Relevant Medications   amoxicillin-clavulanate (AUGMENTIN) 875-125 MG tablet   Other Relevant Orders   Veritor Flu A/B Waived   Novel Coronavirus, NAA ([facility])   Chest congestion       Relevant Medications   amoxicillin-clavulanate (AUGMENTIN) 875-125 MG  tablet   Other Relevant Orders   Veritor Flu A/B Waived   Novel Coronavirus, NAA ([facility])   Community acquired pneumonia, unspecified laterality       Relevant Medications   amoxicillin-clavulanate (AUGMENTIN) 875-125 MG tablet       Patient refused first injection today, will do Augmentin, treat like possible pneumonia.  A1c is up at 9.4, he has samples for [place].  Recommended that he go ahead and started at 5 units a day until we figure out what is going on.  He is going to  follow-up with our pharmacist in 1 month. Follow up plan: Return in about 3 months (around 07/08/2022), or if symptoms worsen or fail to improve, for Diabetes recheck.  Counseling provided for all of the vaccine components Orders Placed This Encounter  Procedures   Novel Coronavirus, NAA ([facility])   Bayer DCA Hb A1c Waived   Veritor Flu A/B [place] Jaxden Blyden, MD [facility] 04/08/2022, 10:00 AM

## 2022-04-09 ENCOUNTER — Other Ambulatory Visit: Payer: Self-pay | Admitting: Family Medicine

## 2022-04-09 LAB — NOVEL CORONAVIRUS, NAA: SARS-CoV-2, NAA: DETECTED — AB

## 2022-04-09 MED ORDER — NIRMATRELVIR/RITONAVIR (PAXLOVID)TABLET: 3.0000 | ORAL_TABLET | Freq: Two times a day (BID) | ORAL | 0 refills | Status: AC

## 2022-04-09 NOTE — Progress Notes (Signed)
Pt aware of positive COVID and that Paxlovid was sent in.

## 2022-04-09 NOTE — Progress Notes (Signed)
Patient COVID-positive, sent in Paxlovid which is the [place] for him.

## 2022-04-14 ENCOUNTER — Ambulatory Visit: Payer: Medicare HMO | Admitting: Internal Medicine

## 2022-04-29 ENCOUNTER — Ambulatory Visit (INDEPENDENT_AMBULATORY_CARE_PROVIDER_SITE_OTHER): Payer: Medicare HMO | Admitting: Pharmacist

## 2022-04-29 DIAGNOSIS — E785 Hyperlipidemia, unspecified: Secondary | ICD-10-CM

## 2022-04-29 DIAGNOSIS — E782 Mixed hyperlipidemia: Secondary | ICD-10-CM

## 2022-04-29 DIAGNOSIS — I10 Essential (primary) hypertension: Secondary | ICD-10-CM

## 2022-04-29 DIAGNOSIS — E559 Vitamin D deficiency, unspecified: Secondary | ICD-10-CM

## 2022-04-29 DIAGNOSIS — E1149 Type 2 diabetes mellitus with other diabetic neurological complication: Secondary | ICD-10-CM

## 2022-04-29 DIAGNOSIS — E119 Type 2 diabetes mellitus without complications: Secondary | ICD-10-CM

## 2022-04-29 MED ORDER — LISINOPRIL 20 MG PO TABS: 20.0000 mg | ORAL_TABLET | Freq: Every day | ORAL | 3 refills | Status: DC

## 2022-04-29 MED ORDER — ATORVASTATIN CALCIUM 40 MG PO TABS: 40.0000 mg | ORAL_TABLET | Freq: Every day | ORAL | 3 refills | Status: DC

## 2022-04-29 MED ORDER — METFORMIN HCL 1000 MG PO TABS: 1000.0000 mg | ORAL_TABLET | Freq: Two times a day (BID) | ORAL | 3 refills | Status: DC

## 2022-04-29 NOTE — Progress Notes (Signed)
Chronic Care Management Pharmacy Note  04/29/2022 Name:  Philip [name] MRN:  [id number] DOB:  [birth year]  Summary: Diabetes: UNControlled PER A1C OF 9.4% IN 03/2022; current treatment: Rybelsus '7mg'$ ; metformin GFR 86 On statin, on ACEi Denies personal and family history of Medullary thyroid cancer (MTC) Approved for Novo nordisk patient assistance 2024 (rybelsus) Continue current therapy (last steroid injection was 03/2021)--FBG within normal limits; recommend patient check post prandials to see if his is spiking after meals; would recommend increase in Rybelsus to '14mg'$  daily if post prandials are high or A1c remains high on recheck Refills given for BP and HLD meds 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 Patient re-enrolled in novo nordisk patient assistance program for 2023; patient picked up last supply 03/2021 -Hyperlipidemia-->education provided on heart healthy diet, goal LDL<70, continue current medicine, patient active/exercises -Hypertension  SELF REPORTS 102/67 AT HOME THIS MORNING  185/81 HR 105 IN OFFICE  Will have patient continue to check daily--seeing cards on 05/27/22  Would recommend cards then potentially increase lisinopril to '40mg'$ ; unless findings at cards would prompt other medications  Patient Goals/Self-Care Activities Over the next 90 days, patient will:  - take medications as prescribed check glucose daily (fasting), document, and provide at  future appointments  Follow Up Plan: Telephone follow up appointment with care management team member scheduled for: Next PCP appointment scheduled for:  March 2024   Subjective: Philip [name] is an 72 y.o. year old male who is a primary patient of Dettinger, Fransisca Kaufmann, MD.  The patient was referred to the Chronic Care Management team for assistance with care management needs subsequent to provider initiation of CCM services and plan of care.    Engaged with patient face to face for follow up visit in response to provider referral for CCM services.   Objective:  LABS:   Lab Results  Component Value Date   CREATININE 0.94 03/26/2022   CREATININE 1.18 03/22/2022   CREATININE 0.92 01/06/2022     Lab Results  Component Value Date   HGBA1C 9.4 (H) 04/08/2022         Component Value Date/Time   CHOL 158 01/06/2022 1443   TRIG 158 (H) 01/06/2022 1443   TRIG 232 (H) 05/08/2013 1200   HDL 47 01/06/2022 1443   HDL 52 05/08/2013 1200   CHOLHDL 3.4 01/06/2022 1443   CHOLHDL 6.0 08/14/2019 0927   VLDL 74 (H) 08/14/2019 0927   LDLCALC 84 01/06/2022 1443   LDLCALC 200 (H) 05/08/2013 1200     Clinical ASCVD: No   The 10-year ASCVD risk score (Arnett DK, et al., 2019) is: 49.4%   Values used to calculate the score:     Age: 72 years     Sex: Male     Is Non-Hispanic African American: No     Diabetic: Yes     Tobacco smoker: Yes     Systolic Blood Pressure: 078 mmHg     Is BP treated: Yes     HDL Cholesterol: 47 mg/dL     Total Cholesterol: 158 mg/dL    Other: (CHADS2VASc if Afib, PHQ9  if depression, MMRC or CAT for COPD, ACT, DEXA)    BP Readings from Last 3 Encounters:  04/08/22 (!) 151/76  03/26/22 126/79  03/23/22 132/82      SDOH:  (Social Determinants of Health) assessments and interventions performed:  SDOH Interventions Today    Flowsheet Row Most Recent Value  SDOH Interventions   Financial Strain Interventions Other (Comment)  [PATIENT ASSISTANCE FOR MEDS]        Allergies  Allergen Reactions   Ativan [Lorazepam] Other (See Comments)    Caused patient not to remember several days.   Blueberry [Vaccinium Angustifolium] Swelling   Fish Oil    Glucosamine Forte [Nutritional Supplements]     Weak, flu like symptoms   Shellfish Allergy    Ultram [Tramadol Hcl] Hives    Medications Reviewed Today     Reviewed by Lavera Guise, [facility] (Pharmacist) on 04/29/22 at 1004  Med List Status: <None>   Medication Order Taking? Sig Documenting Provider Last Dose Status Informant    Discontinued 04/29/22 1003 (Completed Course)   aspirin EC 81 MG tablet [id number] No Take 81 mg by mouth in the morning and at bedtime.  [provider] Taking Active Self  atorvastatin (LIPITOR) 40 MG tablet [id number] No Take 1 tablet (40 mg total) by mouth daily. Dettinger, Fransisca Kaufmann, MD Taking Active   cyclobenzaprine (FLEXERIL) 10 MG tablet [id number] No Take 1 tablet (10 mg total) by mouth 3 (three) times daily as needed for muscle spasms. Dettinger, Fransisca Kaufmann, MD Taking Active   lisinopril (ZESTRIL) 20 MG tablet [id number] No Take 1 tablet (20 mg total) by mouth daily. Dettinger, Fransisca Kaufmann, MD Taking Active   metFORMIN (GLUCOPHAGE) 1000 MG tablet [id number] No Take 1 tablet (1,000 mg total) by mouth 2 (two) times daily with a meal. Dettinger, Fransisca Kaufmann, MD Taking Active   Semaglutide (RYBELSUS) 7 MG TABS [id number] No Take 7 mg by mouth daily. Dettinger, Fransisca Kaufmann, MD Taking Active   Vitamin D, Ergocalciferol, (DRISDOL) 1.25 MG (50000 UNIT) CAPS capsule [id number] No Take 1 capsule (50,000 Units total) by mouth every 7 (seven) days. Dettinger, Fransisca Kaufmann, MD Taking Active               Goals Addressed   None     Plan: Next PCP appointment scheduled for:  March 2024   Regina Eck, PharmD, BCPS, BCACP Clinical Pharmacist, [place]  [place]  T [phone]

## 2022-05-19 DIAGNOSIS — E1149 Type 2 diabetes mellitus with other diabetic neurological complication: Secondary | ICD-10-CM

## 2022-05-19 DIAGNOSIS — E782 Mixed hyperlipidemia: Secondary | ICD-10-CM

## 2022-05-19 DIAGNOSIS — E119 Type 2 diabetes mellitus without complications: Secondary | ICD-10-CM

## 2022-05-19 DIAGNOSIS — E785 Hyperlipidemia, unspecified: Secondary | ICD-10-CM

## 2022-05-19 DIAGNOSIS — I10 Essential (primary) hypertension: Secondary | ICD-10-CM

## 2022-05-20 ENCOUNTER — Ambulatory Visit (INDEPENDENT_AMBULATORY_CARE_PROVIDER_SITE_OTHER): Payer: Medicare HMO | Admitting: Pharmacist

## 2022-05-20 VITALS — BP 113/65 | HR 63

## 2022-05-20 DIAGNOSIS — E1149 Type 2 diabetes mellitus with other diabetic neurological complication: Secondary | ICD-10-CM

## 2022-05-20 DIAGNOSIS — E785 Hyperlipidemia, unspecified: Secondary | ICD-10-CM

## 2022-05-20 DIAGNOSIS — I10 Essential (primary) hypertension: Secondary | ICD-10-CM

## 2022-05-20 NOTE — Progress Notes (Signed)
Chronic Care Management Pharmacy Note  05/20/2022 Name:  Philip [name] MRN:  [id number] DOB:  [birth year]  Summary: UNControlled PER A1C OF 9.4% IN 03/2022; current treatment: Rybelsus 24m; metformin GFR 86 On statin, on ACEi Denies personal and family history of Medullary thyroid cancer (MTC) Approved for Novo nordisk patient assistance 2024 (rybelsus) Continue current therapy (last steroid injection was 03/2021)--FBG within normal limits; recommend patient check post prandials to see if his is spiking after meals; would recommend increase in Rybelsus to 164mdaily if post prandials are high or A1c remains high on recheck Refills given for BP and HLD meds Significant needle phobia Current glucose readings: fasting glucose: 108-157; post prandial glucose: n/a Denies/reports hypoglycemic/hyperglycemic symptoms Current meal patterns: breakfast:  Discussed meal planning options and Plate method for healthy eating Avoid sugary drinks and desserts Incorporate balanced protein, non starchy veggies, 1 serving of carbohydrate with each meal Increase water intake Increase physical activity as able Current exercise: n/a Counseled on rybelsus, dietary changes Assessed patient finances. Patient re-enrolled in novo nordisk patient assistance program for 2023; patient picked up last supply 03/2021 -Hyperlipidemia-->education provided on heart healthy diet, goal LDL<70, continue current medicine, patient active/exercises -Hypertension  SELF REPORTS 113/67 AT HOME THIS MORNING  Reports fluttering/irregular heartbeat--offered PCP appt and instructed patient to go to ER if needed  Will have patient continue to check daily--seeing cards on 05/27/22  Would recommend cards then potentially increase lisinopril to 407munless findings at cards would prompt other medication categories  Patient Goals/Self-Care Activities Over the next 90 days, patient will:  - take medications as prescribed check glucose  daily (fasting), document, and provide at future appointments  Follow Up Plan: Telephone follow up appointment with care management team member scheduled for: Next PCP appointment scheduled for:  March 2024   Subjective: Philip [name] an 71 83o. 72 year old male who is a primary patient of Dettinger, JosFransisca KaufmannD.  The patient was referred to the Chronic Care Management team for assistance with care management needs subsequent to provider initiation of CCM services and plan of care.    Engaged with patient by telephone for follow up visit in response to provider referral for CCM services.   Objective:  LABS:    Lab Results  Component Value Date   CREATININE 0.94 03/26/2022   CREATININE 1.18 03/22/2022   CREATININE 0.92 01/06/2022     Lab Results  Component Value Date   HGBA1C 9.4 (H) 04/08/2022         Component Value Date/Time   CHOL 158 01/06/2022 1443   TRIG 158 (H) 01/06/2022 1443   TRIG 232 (H) 05/08/2013 1200   HDL 47 01/06/2022 1443   HDL 52 05/08/2013 1200   CHOLHDL 3.4 01/06/2022 1443   CHOLHDL 6.0 08/14/2019 0927   VLDL 74 (H) 08/14/2019 0927   LDLCALC 84 01/06/2022 1443   LDLCALC 200 (H) 05/08/2013 1200     Clinical ASCVD: No   The 10-year ASCVD risk score (Arnett DK, et al., 2019) is: 53.1%   Values used to calculate the score:     Age: 72 10ars     Sex: Male     Is Non-Hispanic African American: No     Diabetic: Yes     Tobacco smoker: Yes     Systolic Blood Pressure: 1600000000Hg     Is BP treated: Yes     HDL Cholesterol: 47 mg/dL     Total Cholesterol: 158 mg/dL    Other: (CHADS2VASc  if Afib, PHQ9 if depression, MMRC or CAT for COPD, ACT, DEXA)    BP Readings from Last 3 Encounters:  05/27/22 (!) 160/120  05/20/22 113/65  04/08/22 (!) 151/76      SDOH:  (Social Determinants of Health) assessments and interventions performed:    Allergies  Allergen Reactions   Ativan [Lorazepam] Other (See Comments)    Caused patient not to remember  several days.   Blueberry [Vaccinium Angustifolium] Swelling   Fish Oil    Glucosamine Forte [Nutritional Supplements]     Weak, flu like symptoms   Shellfish Allergy    Ultram [Tramadol Hcl] Hives    Medications Reviewed Today     Reviewed by Lavera Guise, [facility] (Pharmacist) on 06/01/22 at 1038  Med List Status: <None>   Medication Order Taking? Sig Documenting Provider Last Dose Status Informant  aspirin EC 81 MG tablet [id number] No Take 81 mg by mouth in the morning and at bedtime.  [provider] Taking Active Self  atorvastatin (LIPITOR) 40 MG tablet [id number] No Take 1 tablet (40 mg total) by mouth daily. Dettinger, Fransisca Kaufmann, MD Taking Active   cyclobenzaprine (FLEXERIL) 10 MG tablet [id number] No Take 1 tablet (10 mg total) by mouth 3 (three) times daily as needed for muscle spasms.  Patient not taking: Reported on 05/27/2022   Dettinger, Fransisca Kaufmann, MD Not Taking Active   lisinopril (ZESTRIL) 20 MG tablet [id number] No Take 1 tablet (20 mg total) by mouth daily. Dettinger, Fransisca Kaufmann, MD Taking Active   metFORMIN (GLUCOPHAGE) 1000 MG tablet [id number] No Take 1 tablet (1,000 mg total) by mouth 2 (two) times daily with a meal. Dettinger, Fransisca Kaufmann, MD Taking Active   Semaglutide (RYBELSUS) 7 MG TABS [id number] No Take 7 mg by mouth daily. Dettinger, Fransisca Kaufmann, MD Taking Active   Vitamin D, Ergocalciferol, (DRISDOL) 1.25 MG (50000 UNIT) CAPS capsule [id number] No Take 1 capsule (50,000 Units total) by mouth every 7 (seven) days.  Patient not taking: Reported on 05/27/2022   Dettinger, Fransisca Kaufmann, MD Not Taking Active               Goals Addressed               This Visit's Progress     Patient Stated     T2DM, HLD PHARMD GOAL (pt-stated)        Current Barriers:  Unable to independently afford treatment regimen Unable to achieve control of T2DM, HTN, HLD ; mostly due to cost and diet/lifestyle  Pharmacist Clinical Goal(s):  Over the next 90 days, patient will verbalize  ability to afford treatment regimen achieve adherence to monitoring guidelines and medication adherence to achieve therapeutic efficacy achieve control of T2DM, HTN, HLD as evidenced by goal A1c<7%, GOAL LDL<70, HTN<130/ through collaboration with PharmD and provider.   Interventions: 1:1 collaboration with Dettinger, Fransisca Kaufmann, MD regarding development and update of comprehensive plan of care as evidenced by provider attestation and co-signature Inter-disciplinary care team collaboration (see longitudinal plan of care) Comprehensive medication review performed; medication list updated in electronic medical record  Diabetes: UNControlled PER A1C OF 9.4% IN 03/2022; current treatment: Rybelsus 47m; metformin GFR 86 On statin, on ACEi Denies personal and family history of [place] for Novo nordisk patient assistance 2024 (rybelsus) Continue current therapy (last steroid injection was 03/2021)--FBG within normal limits; recommend patient check post prandials to see if his is spiking after meals; would recommend increase in Rybelsus to 115mdaily  if post prandials are high or A1c remains high on recheck Refills given for BP and HLD meds Current glucose readings: fasting glucose: 108-157; post prandial glucose: n/a Denies/reports hypoglycemic/hyperglycemic symptoms Current meal patterns: breakfast:  Discussed meal planning options and Plate method for healthy eating Avoid sugary drinks and desserts Incorporate balanced protein, non starchy veggies, 1 serving of carbohydrate with each meal Increase water intake Increase physical activity as able Current exercise: n/a Counseled on rybelsus, dietary changes Assessed patient finances. Patient re-enrolled in novo nordisk patient assistance program for 2023; patient picked up last supply 03/2021 -Hyperlipidemia-->education provided on heart healthy diet, goal LDL<70, continue current medicine, patient  active/exercises -Hypertension  SELF REPORTS 113/67 AT HOME THIS MORNING  Reports fluttering/irregular heartbeat--offered PCP appt and instructed patient to go to ER if needed  Will have patient continue to check daily--seeing cards on 05/27/22  Would recommend cards then potentially increase lisinopril to 47m; unless findings at cards would prompt other medication categories  Patient Goals/Self-Care Activities Over the next 90 days, patient will:  - take medications as prescribed check glucose daily (fasting), document, and provide at future appointments  Follow Up Plan: Telephone follow up appointment with care management team member scheduled for: Next PCP appointment scheduled for:  March 2024         Plan: Telephone follow up appointment with care management team member scheduled for:  2 months     JRegina Eck PharmD, BCPS, BCACP Clinical Pharmacist, [place]  T [phone]

## 2022-05-27 ENCOUNTER — Encounter: Payer: Self-pay | Admitting: Internal Medicine

## 2022-05-27 ENCOUNTER — Ambulatory Visit: Payer: Medicare HMO | Attending: Internal Medicine | Admitting: Internal Medicine

## 2022-05-27 VITALS — BP 160/120 | HR 135 | Ht 68.5 in | Wt 185.0 lb

## 2022-05-27 DIAGNOSIS — I10 Essential (primary) hypertension: Secondary | ICD-10-CM

## 2022-05-27 DIAGNOSIS — Z136 Encounter for screening for cardiovascular disorders: Secondary | ICD-10-CM | POA: Diagnosis not present

## 2022-05-27 DIAGNOSIS — Z720 Tobacco use: Secondary | ICD-10-CM | POA: Diagnosis not present

## 2022-05-27 DIAGNOSIS — R000 Tachycardia, unspecified: Secondary | ICD-10-CM | POA: Diagnosis not present

## 2022-05-27 NOTE — Progress Notes (Signed)
Cardiology Office Note  Date: 05/27/2022   ID: Philip [name], DOB 1950-08-04, MRN [id number]  PCP:  Dettinger, Fransisca Kaufmann, MD  Cardiologist:  None Electrophysiologist:  None   Reason for Office Visit: Evaluation elevated heart rate at the request of Dr. Warrick Parisian   History of Present Illness: Philip [name] is a 72 y.o. male known to have HTN, DM 2, HLD, PTSD, panic attack was referred to cardiology clinic for evaluation of elevated heart rates.  Patient is a Therapist, sports by profession. He has a history of PTSD and panic attacks. He does not check his heart rates at home but does check his blood pressures which were in the range of 110 to 120 mmHg SBP. He denies any palpitations.  Reported having a muscle twitch in the left side of his chest lasting for a few seconds and resolves by itself denies any chest pain on exertion. Denies any SOB, dizziness, lightness, syncope. He does have severe fatigue feeling as if the burden is on his body for the last few months. Prior to that, he usually fights at the gym 3 times per week and lifted weights. He smokes 1 pack/day cigarettes and has no plan to quit. Denies alcohol use and illicit drug abuse. Patient also reported having phobias of heights and needles in the past. He still continues to have phobia to needles and is not happy with his diabetic medication.  He was previously referred to cardiology clinic in 2012 for evaluation of chest pain. CT cardiac was performed which showed coronary calcium score of 55 but imaging was suboptimal due to elevated heart rates.  Past Medical History:  Diagnosis Date   Chest pain    Diabetes mellitus without complication ([place])    Hyperlipidemia    Hypertension    PTSD (post-traumatic stress disorder)     Past Surgical History:  Procedure Laterality Date   BACK SURGERY  1999   hernitated and ruptured discs   LUMBAR LAMINECTOMY/DECOMPRESSION MICRODISCECTOMY Right 08/01/2014   Procedure: Right L/4-5 Extraforaminal  Diskectomy;  Surgeon: Eustace Moore, MD;  Location: [facility];  Service: Neurosurgery;  Laterality: Right;  Right L/4-5 Extraforaminal Diskectomy   TONSILLECTOMY     TONSILLECTOMY      Current Outpatient Medications  Medication Sig Dispense Refill   aspirin EC 81 MG tablet Take 81 mg by mouth in the morning and at bedtime.      atorvastatin (LIPITOR) 40 MG tablet Take 1 tablet (40 mg total) by mouth daily. 90 tablet 3   lisinopril (ZESTRIL) 20 MG tablet Take 1 tablet (20 mg total) by mouth daily. 90 tablet 3   metFORMIN (GLUCOPHAGE) 1000 MG tablet Take 1 tablet (1,000 mg total) by mouth 2 (two) times daily with a meal. 180 tablet 3   Semaglutide (RYBELSUS) 7 MG TABS Take 7 mg by mouth daily. 90 tablet 3   cyclobenzaprine (FLEXERIL) 10 MG tablet Take 1 tablet (10 mg total) by mouth 3 (three) times daily as needed for muscle spasms. (Patient not taking: Reported on 05/27/2022) 30 tablet 2   Vitamin D, Ergocalciferol, (DRISDOL) 1.25 MG (50000 UNIT) CAPS capsule Take 1 capsule (50,000 Units total) by mouth every 7 (seven) days. (Patient not taking: Reported on 05/27/2022) 12 capsule 3   No current facility-administered medications for this visit.   Allergies:  Ativan [lorazepam], Blueberry [vaccinium angustifolium], Fish oil, Glucosamine forte [nutritional supplements], Shellfish allergy, and Ultram [tramadol hcl]   Social History: The patient  reports that he has been smoking  cigarettes. He has a 26.50 pack-year smoking history. He has never used smokeless tobacco. He reports that he does not drink alcohol and does not use drugs.   Family History: The patient's family history is not on file. He was adopted.   ROS:  Please see the history of present illness. Otherwise, complete review of systems is positive for none.  All other systems are reviewed and negative.   Physical Exam: VS:  BP (!) 160/120   Pulse (!) 135   Ht 5' 8.5" (1.74 m)   Wt 185 lb (83.9 kg)   SpO2 98%   BMI 27.72 kg/m ,  BMI Body mass index is 27.72 kg/m.  Wt Readings from Last 3 Encounters:  05/27/22 185 lb (83.9 kg)  04/08/22 195 lb (88.5 kg)  04/05/22 196 lb (88.9 kg)    General: Patient appears comfortable at rest. HEENT: Conjunctiva and lids normal, oropharynx clear with moist mucosa. Neck: Supple, no elevated JVP or carotid bruits, no thyromegaly. Lungs: Clear to auscultation, nonlabored breathing at rest. Cardiac: Regular rate and rhythm, no S3 or significant systolic murmur, no pericardial rub. Abdomen: Soft, nontender, no hepatomegaly, bowel sounds present, no guarding or rebound. Extremities: No pitting edema, distal pulses 2+. Skin: Warm and dry. Musculoskeletal: No kyphosis. Neuropsychiatric: Alert and oriented x3, affect grossly appropriate.  ECG:  An ECG dated 05/27/2022 was personally reviewed today and demonstrated:  Sinus tachycardia  Recent Labwork: 03/26/2022: ALT 14; AST 19; BUN 10; Creatinine, Ser 0.94; Hemoglobin 13.6; Platelets 279; Potassium 4.2; Sodium 139     Component Value Date/Time   CHOL 158 01/06/2022 1443   TRIG 158 (H) 01/06/2022 1443   TRIG 232 (H) 05/08/2013 1200   HDL 47 01/06/2022 1443   HDL 52 05/08/2013 1200   CHOLHDL 3.4 01/06/2022 1443   CHOLHDL 6.0 08/14/2019 0927   VLDL 74 (H) 08/14/2019 0927   LDLCALC 84 01/06/2022 1443   LDLCALC 200 (H) 05/08/2013 1200    Other Studies Reviewed Today:   Assessment and Plan: Patient is a 72 year old M known to have HTN, DM 2, HLD, nicotine abuse was referred to cardiology clinic for evaluation of elevated heart rate.  # Sinus tachycardia -Patient said he almost had a panic attack show to cardiology clinic visit this morning.  Denies any palpitations. EKG today showed sinus tachycardia. He will benefit from psychiatry referral for management of phobias, PTSD and panic attack. Follow-up with PCP regarding the same.  # HTN, controlled -Continue lisinopril 20 mg once daily  # HLD, currently at goal -Continue  atorvastatin 40 mg nightly.  # Screening of CAD -Severe fatigue can be an anginal equivalent.patient has fear of needles and requested study that is needle free. Obtain CT calcium scoring of coronaries.  If calcium score is more than 400, he will need further evaluation.  # Nicotine abuse -Patient smokes 1 pack/day cigarettes and refused to quit.  Smoking cessation counseling offered.  Refused to be referred to smoking cessation program.  I have spent a total of 45 minutes with patient reviewing chart, EKGs, labs and examining patient as well as establishing an assessment and plan that was discussed with the patient.  > 50% of time was spent in direct patient care.      Medication Adjustments/Labs and Tests Ordered: Current medicines are reviewed at length with the patient today.  Concerns regarding medicines are outlined above.   Tests Ordered: Orders Placed This Encounter  Procedures   EKG 12-Lead    Medication Changes: No  orders of the defined types were placed in this encounter.   Disposition:  Follow up  6 months  Signed, Lacresia Darwish Fidel Levy, MD, 05/27/2022 9:14 AM    [facility] [address] [place], [place] [address]

## 2022-05-27 NOTE — Patient Instructions (Signed)
Medication Instructions:  Your physician recommends that you continue on your current medications as directed. Please refer to the Current Medication list given to you today.   Labwork: None  Testing/Procedures: Calcium Score- Self Pay $99  Follow-Up: Follow up with Dr. Dellia Cloud in 6 months.   Any Other Special Instructions Will Be Listed Below (If Applicable).     If you need a refill on your cardiac medications before your next appointment, please call your pharmacy.

## 2022-06-17 DIAGNOSIS — E1149 Type 2 diabetes mellitus with other diabetic neurological complication: Secondary | ICD-10-CM

## 2022-06-17 DIAGNOSIS — E785 Hyperlipidemia, unspecified: Secondary | ICD-10-CM

## 2022-06-17 DIAGNOSIS — I10 Essential (primary) hypertension: Secondary | ICD-10-CM

## 2022-07-09 ENCOUNTER — Ambulatory Visit (INDEPENDENT_AMBULATORY_CARE_PROVIDER_SITE_OTHER): Payer: Medicare HMO | Admitting: Family Medicine

## 2022-07-09 ENCOUNTER — Encounter: Payer: Self-pay | Admitting: Family Medicine

## 2022-07-09 VITALS — BP 143/84 | HR 130 | Ht 68.5 in | Wt 178.0 lb

## 2022-07-09 DIAGNOSIS — I10 Essential (primary) hypertension: Secondary | ICD-10-CM | POA: Diagnosis not present

## 2022-07-09 DIAGNOSIS — E1149 Type 2 diabetes mellitus with other diabetic neurological complication: Secondary | ICD-10-CM | POA: Diagnosis not present

## 2022-07-09 DIAGNOSIS — E559 Vitamin D deficiency, unspecified: Secondary | ICD-10-CM

## 2022-07-09 DIAGNOSIS — E782 Mixed hyperlipidemia: Secondary | ICD-10-CM

## 2022-07-09 DIAGNOSIS — Z87891 Personal history of nicotine dependence: Secondary | ICD-10-CM

## 2022-07-09 LAB — BAYER DCA HB A1C WAIVED: HB A1C (BAYER DCA - WAIVED): 5.7 % — ABNORMAL HIGH (ref 4.8–5.6)

## 2022-07-09 NOTE — Progress Notes (Signed)
BP (!) 143/84   Pulse (!) 130   Ht 5' 8.5" (1.74 m)   Wt 178 lb (80.7 kg)   SpO2 97%   BMI 26.67 kg/m    Subjective:   Patient ID: Philip [name], male    DOB: 1950-07-02, 72 y.o.   MRN: [id number]  HPI: Philip [name] is a 72 y.o. male presenting on 07/09/2022 for Medical Management of Chronic Issues, Diabetes, and Weight Loss (No appetite)   HPI Type 2 diabetes mellitus Patient comes in today for recheck of his diabetes. Patient has been currently taking metformin and Rybelsus. Patient is currently on an ACE inhibitor/ARB. Patient has not seen an ophthalmologist this year. Patient denies any issues with their feet. The symptom started onset as an adult hypertension and hyperlipidemia ARE RELATED TO DM   Hypertension Patient is currently on lisinopril, and their blood pressure today is 143/84. Patient denies any lightheadedness or dizziness. Patient denies headaches, blurred vision, chest pains, shortness of breath, or weakness. Denies any side effects from medication and is content with current medication.   Hyperlipidemia Patient is coming in for recheck of his hyperlipidemia. The patient is currently taking atorvastatin. They deny any issues with myalgias or history of liver damage from it. They deny any focal numbness or weakness or chest pain.   Relevant past medical, surgical, family and social history reviewed and updated as indicated. Interim medical history since our last visit reviewed. Allergies and medications reviewed and updated.  Review of Systems  Constitutional:  Negative for chills and fever.  Eyes:  Negative for visual disturbance.  Respiratory:  Negative for shortness of breath and wheezing.   Cardiovascular:  Negative for chest pain and leg swelling.  Musculoskeletal:  Negative for back pain and gait problem.  Skin:  Negative for rash.  Neurological:  Negative for dizziness, weakness and light-headedness.  Psychiatric/Behavioral:  Negative for dysphoric mood  and suicidal ideas.   All other systems reviewed and are negative.   Per HPI unless specifically indicated above   Allergies as of 07/09/2022       Reactions   Ativan [lorazepam] Other (See Comments)   Caused patient not to remember several days.   Blueberry [vaccinium Angustifolium] Swelling   Fish Oil    Glucosamine Forte [nutritional Supplements]    Weak, flu like symptoms   Shellfish Allergy    Ultram [tramadol Hcl] Hives        Medication List        Accurate as of July 09, 2022 10:32 AM. If you have any questions, ask your nurse or doctor.          aspirin EC 81 MG tablet Take 81 mg by mouth in the morning and at bedtime.   atorvastatin 40 MG tablet Commonly known as: LIPITOR Take 1 tablet (40 mg total) by mouth daily.   cyclobenzaprine 10 MG tablet Commonly known as: FLEXERIL Take 1 tablet (10 mg total) by mouth 3 (three) times daily as needed for muscle spasms.   lisinopril 20 MG tablet Commonly known as: ZESTRIL Take 1 tablet (20 mg total) by mouth daily.   metFORMIN 1000 MG tablet Commonly known as: GLUCOPHAGE Take 1 tablet (1,000 mg total) by mouth 2 (two) times daily with a meal.   Rybelsus 7 MG Tabs Generic drug: Semaglutide Take 7 mg by mouth daily.   Vitamin D (Ergocalciferol) 1.25 MG (50000 UNIT) Caps capsule Commonly known as: DRISDOL Take 1 capsule (50,000 Units total) by mouth every 7 (seven)  days.         Objective:   BP (!) 143/84   Pulse (!) 130   Ht 5' 8.5" (1.74 m)   Wt 178 lb (80.7 kg)   SpO2 97%   BMI 26.67 kg/m   Wt Readings from Last 3 Encounters:  07/09/22 178 lb (80.7 kg)  05/27/22 185 lb (83.9 kg)  04/08/22 195 lb (88.5 kg)    Physical Exam Vitals and nursing note reviewed.  Constitutional:      General: He is not in acute distress.    Appearance: He is well-developed. He is not diaphoretic.  Eyes:     General: No scleral icterus.    Conjunctiva/sclera: Conjunctivae normal.  Neck:     Thyroid: No  thyromegaly.  Cardiovascular:     Rate and Rhythm: Normal rate and regular rhythm.     Heart sounds: Normal heart sounds. No murmur heard. Pulmonary:     Effort: Pulmonary effort is normal. No respiratory distress.     Breath sounds: Normal breath sounds. No wheezing.  Musculoskeletal:        General: No swelling. Normal range of motion.     Cervical back: Neck supple.  Lymphadenopathy:     Cervical: No cervical adenopathy.  Skin:    General: Skin is warm and dry.     Findings: No rash.  Neurological:     Mental Status: He is alert and oriented to person, place, and time.     Coordination: Coordination normal.  Psychiatric:        Behavior: Behavior normal.       Assessment & Plan:   Problem List Items Addressed This Visit       Cardiovascular and Mediastinum   Hypertension (Chronic)     Endocrine   Diabetes ([place]) - Primary   Relevant Orders   Bayer DCA Hb A1c Waived   Microalbumin / creatinine urine ratio     Other   Hyperlipidemia (Chronic)   Vitamin D deficiency   Relevant Orders   VITAMIN D 25 Hydroxy (Vit-D Deficiency, Fractures)   Other Visit Diagnoses     Stopped smoking with greater than 30 pack year history       Relevant Orders   CT CHEST LUNG CA SCREEN LOW DOSE W/O CM       A1c is much better, will lower the dose of Rybelsus, Almyra Free will work on that through prescription assistance and will go down to 3 mg daily and see if that helps more with his appetite and energy. Follow up plan: Return in about 3 months (around 10/09/2022), or if symptoms worsen or fail to improve, for Diabetes hypertension cholesterol.  Counseling provided for all of the vaccine components Orders Placed This Encounter  Procedures   CT CHEST LUNG CA SCREEN LOW DOSE W/O CM   Bayer DCA Hb A1c Waived   Microalbumin / creatinine urine ratio   VITAMIN D 25 Hydroxy (Vit-D Deficiency, Fractures)    Caryl Pina, MD [facility] 07/09/2022, 10:32  AM

## 2022-07-11 LAB — MICROALBUMIN / CREATININE URINE RATIO
Creatinine, Urine: 34.2 mg/dL
Microalb/Creat Ratio: 11 mg/g creat (ref 0–29)
Microalbumin, Urine: 3.8 ug/mL

## 2022-07-13 ENCOUNTER — Ambulatory Visit (HOSPITAL_COMMUNITY)
Admission: RE | Admit: 2022-07-13 | Discharge: 2022-07-13 | Disposition: A | Discharge status: Home / Self Care | Payer: Medicare HMO | Source: Ambulatory Visit | Attending: Internal Medicine | Admitting: Internal Medicine

## 2022-07-13 DIAGNOSIS — Z136 Encounter for screening for cardiovascular disorders: Secondary | ICD-10-CM | POA: Insufficient documentation

## 2022-09-06 NOTE — Telephone Encounter (Signed)
Erroneous encounter will close.

## 2022-10-11 ENCOUNTER — Encounter: Payer: Self-pay | Admitting: Family Medicine

## 2022-10-11 ENCOUNTER — Ambulatory Visit (INDEPENDENT_AMBULATORY_CARE_PROVIDER_SITE_OTHER): Payer: Medicare HMO | Admitting: Family Medicine

## 2022-10-11 VITALS — BP 139/78 | HR 102 | Ht 68.5 in | Wt 181.0 lb

## 2022-10-11 DIAGNOSIS — I152 Hypertension secondary to endocrine disorders: Secondary | ICD-10-CM

## 2022-10-11 DIAGNOSIS — E1159 Type 2 diabetes mellitus with other circulatory complications: Secondary | ICD-10-CM | POA: Diagnosis not present

## 2022-10-11 DIAGNOSIS — E782 Mixed hyperlipidemia: Secondary | ICD-10-CM | POA: Diagnosis not present

## 2022-10-11 DIAGNOSIS — I10 Essential (primary) hypertension: Secondary | ICD-10-CM

## 2022-10-11 DIAGNOSIS — E1169 Type 2 diabetes mellitus with other specified complication: Secondary | ICD-10-CM

## 2022-10-11 DIAGNOSIS — E1149 Type 2 diabetes mellitus with other diabetic neurological complication: Secondary | ICD-10-CM

## 2022-10-11 LAB — BAYER DCA HB A1C WAIVED: HB A1C (BAYER DCA - WAIVED): 6.2 % — ABNORMAL HIGH (ref 4.8–5.6)

## 2022-10-11 MED ORDER — LISINOPRIL 10 MG PO TABS: 10.0000 mg | ORAL_TABLET | Freq: Every day | ORAL | 3 refills | Status: DC

## 2022-10-11 NOTE — Progress Notes (Signed)
BP 139/78   Pulse (!) 102   Ht 5' 8.5" (1.74 m)   Wt 181 lb (82.1 kg)   SpO2 98%   BMI 27.12 kg/m    Subjective:   Patient ID: Philip [name], male    DOB: 1951/02/29, 72 y.o.   MRN: [id number]  HPI: Philip [name] is a 72 y.o. male presenting on 10/11/2022 for Medical Management of Chronic Issues and Diabetes   HPI Type 2 diabetes mellitus Patient comes in today for recheck of his diabetes. Patient has been currently taking metformin and Rybelsus. Patient is currently on an ACE inhibitor/ARB. Patient has not seen an ophthalmologist this year. Patient denies any new issues with their feet. The symptom started onset as an adult hypertension and hyperlipidemia and history of CVA ARE RELATED TO DM   Hypertension Patient is currently on lisinopril, and their blood pressure today is 139/78. Patient denies any lightheadedness or dizziness. Patient denies headaches, blurred vision, chest pains, shortness of breath, or weakness. Denies any side effects from medication and is content with current medication.   Hyperlipidemia Patient is coming in for recheck of his hyperlipidemia. The patient is currently taking atorvastatin. They deny any issues with myalgias or history of liver damage from it. They deny any focal numbness or weakness or chest pain.   Relevant past medical, surgical, family and social history reviewed and updated as indicated. Interim medical history since our last visit reviewed. Allergies and medications reviewed and updated.  Review of Systems  Constitutional:  Positive for fatigue. Negative for chills and fever.  Eyes:  Negative for discharge.  Respiratory:  Negative for shortness of breath and wheezing.   Cardiovascular:  Negative for chest pain and leg swelling.  Musculoskeletal:  Negative for back pain and gait problem.  Skin:  Negative for rash.  Neurological:  Positive for light-headedness. Negative for dizziness, speech difficulty and headaches.  All other systems  reviewed and are negative.   Per HPI unless specifically indicated above   Allergies as of 10/11/2022       Reactions   Ativan [lorazepam] Other (See Comments)   Caused patient not to remember several days.   Blueberry [vaccinium Angustifolium] Swelling   Fish Oil    Glucosamine Forte [nutritional Supplements]    Weak, flu like symptoms   Shellfish Allergy    Ultram [tramadol Hcl] Hives        Medication List        Accurate as of October 11, 2022  1:58 PM. If you have any questions, ask your nurse or doctor.          STOP taking these medications    Rybelsus 7 MG Tabs Generic drug: Semaglutide Stopped by: Elige Radon Amandalynn Pitz, MD       TAKE these medications    aspirin EC 81 MG tablet Take 81 mg by mouth in the morning and at bedtime.   atorvastatin 40 MG tablet Commonly known as: LIPITOR Take 1 tablet (40 mg total) by mouth daily.   cyclobenzaprine 10 MG tablet Commonly known as: FLEXERIL Take 1 tablet (10 mg total) by mouth 3 (three) times daily as needed for muscle spasms.   lisinopril 10 MG tablet Commonly known as: ZESTRIL Take 1 tablet (10 mg total) by mouth daily. What changed:  medication strength how much to take Changed by: Nils Pyle, MD   metFORMIN 1000 MG tablet Commonly known as: GLUCOPHAGE Take 1 tablet (1,000 mg total) by mouth 2 (two) times daily with  a meal.   Vitamin D (Ergocalciferol) 1.25 MG (50000 UNIT) Caps capsule Commonly known as: DRISDOL Take 1 capsule (50,000 Units total) by mouth every 7 (seven) days.         Objective:   BP 139/78   Pulse (!) 102   Ht 5' 8.5" (1.74 m)   Wt 181 lb (82.1 kg)   SpO2 98%   BMI 27.12 kg/m   Wt Readings from Last 3 Encounters:  10/11/22 181 lb (82.1 kg)  07/09/22 178 lb (80.7 kg)  05/27/22 185 lb (83.9 kg)    Physical Exam Vitals and nursing note reviewed.  Constitutional:      General: He is not in acute distress.    Appearance: He is well-developed. He is not  diaphoretic.  Eyes:     General: No scleral icterus.    Conjunctiva/sclera: Conjunctivae normal.  Neck:     Thyroid: No thyromegaly.  Cardiovascular:     Rate and Rhythm: Normal rate and regular rhythm.     Heart sounds: Normal heart sounds. No murmur heard. Pulmonary:     Effort: Pulmonary effort is normal. No respiratory distress.     Breath sounds: Normal breath sounds. No wheezing.  Musculoskeletal:        General: No swelling. Normal range of motion.     Cervical back: Neck supple.  Lymphadenopathy:     Cervical: No cervical adenopathy.  Skin:    General: Skin is warm and dry.     Findings: No rash.  Neurological:     Mental Status: He is alert and oriented to person, place, and time.     Coordination: Coordination normal.  Psychiatric:        Behavior: Behavior normal.       Assessment & Plan:   Problem List Items Addressed This Visit       Cardiovascular and Mediastinum   Hypertension (Chronic)   Relevant Medications   lisinopril (ZESTRIL) 10 MG tablet     Endocrine   Diabetes ([facility]) - Primary   Relevant Medications   lisinopril (ZESTRIL) 10 MG tablet   Other Relevant Orders   CBC with Differential/Platelet   CMP14+EGFR   Lipid panel   Bayer DCA Hb A1c Waived     Other   Hyperlipidemia (Chronic)   Relevant Medications   lisinopril (ZESTRIL) 10 MG tablet   Other Visit Diagnoses     Essential hypertension       Relevant Medications   lisinopril (ZESTRIL) 10 MG tablet       BP is running down at home and he still feels weak and lightheaded and dizzy, will adjust his blood pressure pills down and go to lisinopril 10 mg daily and see if that is better for him.  A1c looks good at 6.2.  No changes there Follow up plan: Return in about 3 months (around 01/11/2023), or if symptoms worsen or fail to improve, for Diabetes and hypertension and cholesterol.  Counseling provided for all of the vaccine components Orders Placed This Encounter  Procedures    CBC with Differential/Platelet   CMP14+EGFR   Lipid panel   Bayer DCA Hb A1c Waived    Arville Care, MD [facility] 10/11/2022, 1:58 PM

## 2022-10-12 LAB — CBC WITH DIFFERENTIAL/PLATELET
Basophils Absolute: 0.1 10*3/uL (ref 0.0–0.2)
Basos: 1 %
EOS (ABSOLUTE): 0.2 10*3/uL (ref 0.0–0.4)
Eos: 2 %
Hematocrit: 40.3 % (ref 37.5–51.0)
Hemoglobin: 13.9 g/dL (ref 13.0–17.7)
Immature Grans (Abs): 0 10*3/uL (ref 0.0–0.1)
Immature Granulocytes: 0 %
Lymphocytes Absolute: 3.3 10*3/uL — ABNORMAL HIGH (ref 0.7–3.1)
Lymphs: 35 %
MCH: 31.8 pg (ref 26.6–33.0)
MCHC: 34.5 g/dL (ref 31.5–35.7)
MCV: 92 fL (ref 79–97)
Monocytes Absolute: 0.8 10*3/uL (ref 0.1–0.9)
Monocytes: 8 %
Neutrophils Absolute: 5.1 10*3/uL (ref 1.4–7.0)
Neutrophils: 54 %
Platelets: 327 10*3/uL (ref 150–450)
RBC: 4.37 x10E6/uL (ref 4.14–5.80)
RDW: 12.8 % (ref 11.6–15.4)
WBC: 9.4 10*3/uL (ref 3.4–10.8)

## 2022-10-12 LAB — CMP14+EGFR
ALT: 9 IU/L (ref 0–44)
AST: 16 IU/L (ref 0–40)
Albumin: 4.5 g/dL (ref 3.8–4.8)
Alkaline Phosphatase: 102 IU/L (ref 44–121)
BUN/Creatinine Ratio: 14 (ref 10–24)
BUN: 13 mg/dL (ref 8–27)
Bilirubin Total: 0.3 mg/dL (ref 0.0–1.2)
CO2: 23 mmol/L (ref 20–29)
Calcium: 11 mg/dL — ABNORMAL HIGH (ref 8.6–10.2)
Chloride: 103 mmol/L (ref 96–106)
Creatinine, Ser: 0.9 mg/dL (ref 0.76–1.27)
Globulin, Total: 2.2 g/dL (ref 1.5–4.5)
Glucose: 121 mg/dL — ABNORMAL HIGH (ref 70–99)
Potassium: 4.9 mmol/L (ref 3.5–5.2)
Sodium: 140 mmol/L (ref 134–144)
Total Protein: 6.7 g/dL (ref 6.0–8.5)
eGFR: 91 mL/min/{1.73_m2} (ref >59)

## 2022-10-12 LAB — LIPID PANEL
Chol/HDL Ratio: 2.9 ratio (ref 0.0–5.0)
Cholesterol, Total: 149 mg/dL (ref 100–199)
HDL: 51 mg/dL (ref >39)
LDL Chol Calc (NIH): 76 mg/dL (ref 0–99)
Triglycerides: 126 mg/dL (ref 0–149)
VLDL Cholesterol Cal: 22 mg/dL (ref 5–40)

## 2022-10-20 ENCOUNTER — Telehealth: Payer: Self-pay | Admitting: Family Medicine

## 2022-10-20 DIAGNOSIS — E119 Type 2 diabetes mellitus without complications: Secondary | ICD-10-CM

## 2022-10-20 MED ORDER — RYBELSUS 7 MG PO TABS
7.0000 mg | ORAL_TABLET | Freq: Every day | ORAL | 0 refills | Status: DC
Start: 2022-10-20 — End: 2024-01-25

## 2022-10-20 MED ORDER — METFORMIN HCL 1000 MG PO TABS
1000.0000 mg | ORAL_TABLET | Freq: Two times a day (BID) | ORAL | 0 refills | Status: DC
Start: 2022-10-20 — End: 2023-01-12

## 2022-10-20 NOTE — Telephone Encounter (Signed)
  Prescription Request  10/20/2022  Is this a "Controlled Substance" medicine? no  Have you seen your PCP in the last 2 weeks? no  If YES, route message to pool  -  If NO, patient needs to be scheduled for appointment.  What is the name of the medication or equipment? Metformin 1000 mg  Have you contacted your pharmacy to request a refill? no   Which pharmacy would you like this sent to? [facility] in [place]   Patient notified that their request is being sent to the clinical staff for review and that they should receive a response within 2 business days.

## 2022-10-20 NOTE — Telephone Encounter (Signed)
Pt in office this morning, Metformin sent to [facility]. Samples of Rybelsus 3 mg given.

## 2022-11-25 ENCOUNTER — Ambulatory Visit: Payer: Medicare HMO | Admitting: Internal Medicine

## 2022-11-30 ENCOUNTER — Ambulatory Visit: Payer: Medicare HMO | Attending: Internal Medicine | Admitting: Internal Medicine

## 2022-11-30 NOTE — Progress Notes (Signed)
Erroneous encounter - please disregard.

## 2022-12-01 ENCOUNTER — Encounter: Payer: Self-pay | Admitting: Internal Medicine

## 2023-01-06 ENCOUNTER — Telehealth: Payer: Self-pay | Admitting: Pharmacist

## 2023-01-06 NOTE — Telephone Encounter (Signed)
   Patient stable on Ryelsus 7mg  daily for T2DM.  Patient assistance supply is not here.  Will give him Rybelsus 7mg  sample to bridge supply.  UYQ#[id number], EXP 7/31/12025.  Patient denies personal or family history of medullary thyroid carcinoma (MTC) or in patients with Multiple Endocrine Neoplasia syndrome type 2 (MEN 2).  Routing encounter to CPhT to ensure patient is enrolled for Rybelsus 7mg  daily via [employer] PAP.   Kieth Brightly, PharmD, BCACP Clinical Pharmacist, [facility]

## 2023-01-12 ENCOUNTER — Encounter: Payer: Self-pay | Admitting: Family Medicine

## 2023-01-12 ENCOUNTER — Ambulatory Visit: Payer: Medicare HMO | Admitting: Family Medicine

## 2023-01-12 VITALS — BP 122/82 | HR 103 | Ht 68.5 in | Wt 187.0 lb

## 2023-01-12 DIAGNOSIS — I152 Hypertension secondary to endocrine disorders: Secondary | ICD-10-CM

## 2023-01-12 DIAGNOSIS — Z7984 Long term (current) use of oral hypoglycemic drugs: Secondary | ICD-10-CM

## 2023-01-12 DIAGNOSIS — E119 Type 2 diabetes mellitus without complications: Secondary | ICD-10-CM | POA: Diagnosis not present

## 2023-01-12 DIAGNOSIS — E1159 Type 2 diabetes mellitus with other circulatory complications: Secondary | ICD-10-CM | POA: Diagnosis not present

## 2023-01-12 DIAGNOSIS — I10 Essential (primary) hypertension: Secondary | ICD-10-CM

## 2023-01-12 DIAGNOSIS — E1169 Type 2 diabetes mellitus with other specified complication: Secondary | ICD-10-CM | POA: Diagnosis not present

## 2023-01-12 DIAGNOSIS — E782 Mixed hyperlipidemia: Secondary | ICD-10-CM | POA: Diagnosis not present

## 2023-01-12 LAB — BAYER DCA HB A1C WAIVED: HB A1C (BAYER DCA - WAIVED): 6.7 % — ABNORMAL HIGH (ref 4.8–5.6)

## 2023-01-12 MED ORDER — METFORMIN HCL 1000 MG PO TABS
1000.0000 mg | ORAL_TABLET | Freq: Two times a day (BID) | ORAL | 3 refills | Status: DC
Start: 2023-01-12 — End: 2023-10-28

## 2023-01-12 NOTE — Progress Notes (Signed)
BP 122/82   Pulse (!) 103   Ht 5' 8.5" (1.74 m)   Wt 187 lb (84.8 kg)   SpO2 96%   BMI 28.02 kg/m    Subjective:   Patient ID: Philip [name], male    DOB: [birth year], 72 y.o.   MRN: [id number]  HPI: Philip [name] is a 72 y.o. male presenting on 01/12/2023 for Medical Management of Chronic Issues, Diabetes, Hyperlipidemia, and Hypertension   HPI Type 2 diabetes mellitus Patient comes in today for recheck of his diabetes. Patient has been currently taking metformin and Rybelsus. Patient is currently on an ACE inhibitor/ARB. Patient has not seen an ophthalmologist this year. Patient denies any new issues with their feet. The symptom started onset as an adult hypertension and hyperlipidemia ARE RELATED TO DM   Hypertension Patient is currently on lisinopril, and their blood pressure today is 122/82. Patient denies any lightheadedness or dizziness. Patient denies headaches, blurred vision, chest pains, shortness of breath, or weakness. Denies any side effects from medication and is content with current medication.   Hyperlipidemia Patient is coming in for recheck of his hyperlipidemia. The patient is currently taking atorvastatin. They deny any issues with myalgias or history of liver damage from it. They deny any focal numbness or weakness or chest pain.   Hypercalcemia recheck Patient had slightly elevated calcium last time but recheck today.  He denies any symptoms or issues with it.  He had stopped the oral vitamin D  Relevant past medical, surgical, family and social history reviewed and updated as indicated. Interim medical history since our last visit reviewed. Allergies and medications reviewed and updated.  Review of Systems  Constitutional:  Negative for chills and fever.  Eyes:  Negative for visual disturbance.  Respiratory:  Negative for shortness of breath and wheezing.   Cardiovascular:  Negative for chest pain and leg swelling.  Musculoskeletal:  Positive for arthralgias  and back pain.  Skin:  Negative for rash.  Neurological:  Negative for dizziness and light-headedness.  All other systems reviewed and are negative.   Per HPI unless specifically indicated above   Allergies as of 01/12/2023       Reactions   Ativan [lorazepam] Other (See Comments)   Caused patient not to remember several days.   Blueberry [vaccinium Angustifolium] Swelling   Fish Oil    Glucosamine Forte [nutritional Supplements]    Weak, flu like symptoms   Shellfish Allergy    Ultram [tramadol Hcl] Hives        Medication List        Accurate as of January 12, 2023  2:01 PM. If you have any questions, ask your nurse or doctor.          STOP taking these medications    Vitamin D (Ergocalciferol) 1.25 MG (50000 UNIT) Caps capsule Commonly known as: DRISDOL Stopped by: Elige Radon Daysi Boggan       TAKE these medications    aspirin EC 81 MG tablet Take 81 mg by mouth in the morning and at bedtime.   atorvastatin 40 MG tablet Commonly known as: LIPITOR Take 1 tablet (40 mg total) by mouth daily.   cyclobenzaprine 10 MG tablet Commonly known as: FLEXERIL Take 1 tablet (10 mg total) by mouth 3 (three) times daily as needed for muscle spasms.   lisinopril 10 MG tablet Commonly known as: ZESTRIL Take 1 tablet (10 mg total) by mouth daily.   metFORMIN 1000 MG tablet Commonly known as: GLUCOPHAGE Take 1 tablet (  1,000 mg total) by mouth 2 (two) times daily with a meal.   Rybelsus 7 MG Tabs Generic drug: Semaglutide Take 1 tablet (7 mg total) by mouth daily.         Objective:   BP 122/82   Pulse (!) 103   Ht 5' 8.5" (1.74 m)   Wt 187 lb (84.8 kg)   SpO2 96%   BMI 28.02 kg/m   Wt Readings from Last 3 Encounters:  01/12/23 187 lb (84.8 kg)  10/11/22 181 lb (82.1 kg)  07/09/22 178 lb (80.7 kg)    Physical Exam Vitals and nursing note reviewed.  Constitutional:      General: He is not in acute distress.    Appearance: He is well-developed. He  is not diaphoretic.  Eyes:     General: No scleral icterus.    Conjunctiva/sclera: Conjunctivae normal.  Neck:     Thyroid: No thyromegaly.  Cardiovascular:     Rate and Rhythm: Normal rate and regular rhythm.     Heart sounds: Normal heart sounds. No murmur heard. Pulmonary:     Effort: Pulmonary effort is normal. No respiratory distress.     Breath sounds: Normal breath sounds. No wheezing.  Musculoskeletal:        General: Normal range of motion.     Cervical back: Neck supple.  Lymphadenopathy:     Cervical: No cervical adenopathy.  Skin:    General: Skin is warm and dry.     Findings: No rash.  Neurological:     Mental Status: He is alert and oriented to person, place, and time.     Coordination: Coordination normal.  Psychiatric:        Behavior: Behavior normal.       Assessment & Plan:   Problem List Items Addressed This Visit       Cardiovascular and Mediastinum   Hypertension (Chronic)     Endocrine   Diabetes ([facility]) - Primary   Relevant Medications   metFORMIN (GLUCOPHAGE) 1000 MG tablet   Other Relevant Orders   BMP8+EGFR   Bayer DCA Hb A1c Waived     Other   Hyperlipidemia (Chronic)   Hypercalcemia  A1c up slightly at 6.7 but he just started back into an exercise regimen and will likely come down with that.  No change in medication.  Follow up plan: Return in about 3 months (around 04/13/2023), or if symptoms worsen or fail to improve, for Diabetes.  Counseling provided for all of the vaccine components Orders Placed This Encounter  Procedures   BMP8+EGFR   Bayer DCA Hb A1c Waived    Arville Care, MD [facility] 01/12/2023, 2:01 PM

## 2023-01-13 LAB — BMP8+EGFR
BUN/Creatinine Ratio: 11 (ref 10–24)
BUN: 10 mg/dL (ref 8–27)
CO2: 22 mmol/L (ref 20–29)
Calcium: 11 mg/dL — ABNORMAL HIGH (ref 8.6–10.2)
Chloride: 102 mmol/L (ref 96–106)
Creatinine, Ser: 0.92 mg/dL (ref 0.76–1.27)
Glucose: 101 mg/dL — ABNORMAL HIGH (ref 70–99)
Potassium: 4.9 mmol/L (ref 3.5–5.2)
Sodium: 141 mmol/L (ref 134–144)
eGFR: 89 mL/min/{1.73_m2} (ref >59)

## 2023-01-13 NOTE — Telephone Encounter (Signed)
Received notification from [employer] regarding approval for RYBELSUS. Patient assistance approved from 01/12/23 to 04/19/23.  Medication will ship to [facility]  Pt ID: [id number]  Company phone: [phone]

## 2023-02-04 ENCOUNTER — Telehealth: Payer: Self-pay | Admitting: Pharmacist

## 2023-02-04 NOTE — Telephone Encounter (Signed)
FBG 108, 110 this AM Continue rybelsus 7mg  daily for T2DM 4 month PAP supply of rybelsus 7mg  left up front for patient to pick up Reminded patient that we will be mailing all PAPs for re-enrollment this year

## 2023-03-10 ENCOUNTER — Ambulatory Visit: Payer: Medicare HMO | Admitting: *Deleted

## 2023-03-10 DIAGNOSIS — E119 Type 2 diabetes mellitus without complications: Secondary | ICD-10-CM | POA: Diagnosis not present

## 2023-03-10 LAB — HM DIABETES EYE EXAM

## 2023-03-10 NOTE — Progress Notes (Signed)
Philip [name] arrived 03/10/2023 and has given verbal consent to obtain images and complete their overdue diabetic retinal screening.  The images have been sent to an ophthalmologist or optometrist for review and interpretation.  Results will be sent back to Dettinger, Elige Radon, MD for review.  Patient has been informed they will be contacted when we receive the results via telephone or MyChart

## 2023-04-17 ENCOUNTER — Other Ambulatory Visit: Payer: Self-pay | Admitting: Family Medicine

## 2023-04-17 DIAGNOSIS — I10 Essential (primary) hypertension: Secondary | ICD-10-CM

## 2023-04-17 DIAGNOSIS — E785 Hyperlipidemia, unspecified: Secondary | ICD-10-CM

## 2023-04-21 ENCOUNTER — Ambulatory Visit: Payer: Medicare HMO

## 2023-04-21 VITALS — Ht 68.5 in | Wt 187.0 lb

## 2023-04-21 DIAGNOSIS — Z0000 Encounter for general adult medical examination without abnormal findings: Secondary | ICD-10-CM

## 2023-04-21 NOTE — Progress Notes (Signed)
 Subjective:   Philip [name] is a 73 y.o. male who presents for Medicare Annual/Subsequent preventive examination.  Visit Complete: Virtual I connected with  Philip [name] on 04/21/23 by a audio enabled telemedicine application and verified that I am speaking with the correct person using two identifiers.  Patient Location: Home  Provider Location: Home Office  This patient declined Interactive audio and video telecommunications. Therefore the visit was completed with audio only.  I discussed the limitations of evaluation and management by telemedicine. The patient expressed understanding and agreed to proceed.  Vital Signs: Because this visit was a virtual/telehealth visit, some criteria may be missing or patient reported. Any vitals not documented were not able to be obtained and vitals that have been documented are patient reported.  Cardiac Risk Factors include: advanced age (>28men, >58 women);diabetes mellitus;male gender;hypertension     Objective:    Today's Vitals   04/21/23 1254  Weight: 187 lb (84.8 kg)  Height: 5' 8.5 (1.74 m)   Body mass index is 28.02 kg/m.     04/21/2023    2:23 PM 04/05/2022   12:11 PM 03/22/2022    5:30 PM 04/03/2021    9:02 AM 04/01/2020    8:35 AM 08/29/2019    8:34 PM 08/14/2019    2:00 AM  Advanced Directives  Does Patient Have a Medical Advance Directive? No No No No No No No  Would patient like information on creating a medical advance directive? Yes (MAU/Ambulatory/Procedural Areas - Information given) No - Patient declined No - Patient declined No - Patient declined No - Patient declined  No - Patient declined    Current Medications (verified) Outpatient Encounter Medications as of 04/21/2023  Medication Sig   aspirin  EC 81 MG tablet Take 81 mg by mouth in the morning and at bedtime.    atorvastatin  (LIPITOR) 40 MG tablet Take 1 tablet by mouth once daily   cyclobenzaprine  (FLEXERIL ) 10 MG tablet Take 1 tablet (10 mg total) by mouth  3 (three) times daily as needed for muscle spasms.   lisinopril  (ZESTRIL ) 10 MG tablet Take 1 tablet (10 mg total) by mouth daily.   metFORMIN  (GLUCOPHAGE ) 1000 MG tablet Take 1 tablet (1,000 mg total) by mouth 2 (two) times daily with a meal.   Semaglutide  (RYBELSUS ) 7 MG TABS Take 1 tablet (7 mg total) by mouth daily.   No facility-administered encounter medications on file as of 04/21/2023.    Allergies (verified) Ativan  [lorazepam ], Blueberry [vaccinium angustifolium], Fish oil, Glucosamine forte [nutritional supplements], Shellfish allergy, and Ultram [tramadol hcl]   History: Past Medical History:  Diagnosis Date   Chest pain    Diabetes mellitus without complication (HCC)    Hyperlipidemia    Hypertension    PTSD (post-traumatic stress disorder)    Past Surgical History:  Procedure Laterality Date   BACK SURGERY  1999   hernitated and ruptured discs   LUMBAR LAMINECTOMY/DECOMPRESSION MICRODISCECTOMY Right 08/01/2014   Procedure: Right L/4-5 Extraforaminal Diskectomy;  Surgeon: Alm GORMAN Molt, MD;  Location: [facility];  Service: Neurosurgery;  Laterality: Right;  Right L/4-5 Extraforaminal Diskectomy   TONSILLECTOMY     TONSILLECTOMY     Family History  Adopted: Yes   Social History   Socioeconomic History   Marital status: Single    Spouse name: Not on file   Number of children: Not on file   Years of education: senior college   Highest education level: Bachelor's degree (e.g., BA, AB, BS)  Occupational History  Occupation: Retired  Tobacco Use   Smoking status: Some Days    Current packs/day: 0.50    Average packs/day: 0.5 packs/day for 53.0 years (26.5 ttl pk-yrs)    Types: Cigarettes   Smokeless tobacco: Never  Vaping Use   Vaping status: Never Used  Substance and Sexual Activity   Alcohol use: No   Drug use: No   Sexual activity: Not Currently  Other Topics Concern   Not on file  Social History Narrative   Not on file   Social Drivers of Health    Financial Resource Strain: Low Risk  (04/21/2023)   Overall Financial Resource Strain (CARDIA)    Difficulty of Paying Living Expenses: Not hard at all  Food Insecurity: No Food Insecurity (04/21/2023)   Hunger Vital Sign    Worried About Running Out of Food in the Last Year: Never true    Ran Out of Food in the Last Year: Never true  Transportation Needs: No Transportation Needs (04/21/2023)   PRAPARE - Administrator, Civil Service (Medical): No    Lack of Transportation (Non-Medical): No  Physical Activity: Sufficiently Active (04/21/2023)   Exercise Vital Sign    Days of Exercise per Week: 5 days    Minutes of Exercise per Session: 30 min  Stress: No Stress Concern Present (04/21/2023)   [employer] of Occupational Health - Occupational Stress Questionnaire    Feeling of Stress : Not at all  Social Connections: Moderately Isolated (04/21/2023)   Social Connection and Isolation Panel [NHANES]    Frequency of Communication with Friends and Family: Twice a week    Frequency of Social Gatherings with Friends and Family: Twice a week    Attends Religious Services: Never    Database Administrator or Organizations: Yes    Attends Engineer, Structural: More than 4 times per year    Marital Status: Divorced    Tobacco Counseling Ready to quit: Not Answered Counseling given: Not Answered   Clinical Intake:  Pre-visit preparation completed: Yes  Pain : No/denies pain     Diabetes: Yes CBG done?: No Did pt. bring in CBG monitor from home?: No  How often do you need to have someone help you when you read instructions, pamphlets, or other written materials from your doctor or pharmacy?: 1 - Never  Interpreter Needed?: No  Information entered by :: Charmaine Bloodgood LPN   Activities of Daily Living    04/21/2023    1:02 PM  In your present state of health, do you have any difficulty performing the following activities:  Hearing? 0  Vision? 0  Difficulty  concentrating or making decisions? 0  Walking or climbing stairs? 0  Dressing or bathing? 0  Doing errands, shopping? 0  Preparing Food and eating ? N  Using the Toilet? N  In the past six months, have you accidently leaked urine? N  Do you have problems with loss of bowel control? N  Managing your Medications? N  Managing your Finances? N  Housekeeping or managing your Housekeeping? N    Patient Care Team: Dettinger, Fonda LABOR, MD as PCP - General (Family Medicine) Mallipeddi, Diannah SQUIBB, MD as PCP - Cardiology (Cardiology) Billee Mliss BIRCH, [facility] (Pharmacist)  Indicate any recent Medical Services you may have received from other than [facility] providers in the past year (date may be approximate).     Assessment:   This is a routine wellness examination for [place].  Hearing/Vision screen Hearing Screening -  Comments:: Denies hearing difficulties   Vision Screening - Comments::  up to date with routine eye exams with in office retinal scan     Goals Addressed   None   Depression Screen    04/21/2023    2:22 PM 01/12/2023    1:33 PM 10/11/2022    1:40 PM 07/09/2022    9:59 AM 04/08/2022    9:30 AM 04/05/2022   12:10 PM 03/26/2022    3:59 PM  PHQ 2/9 Scores  PHQ - 2 Score 2 2  2 4  0 1  PHQ- 9 Score 10 13  13 16  0 4  Exception Documentation   Patient refusal        Fall Risk    04/21/2023    2:24 PM 01/12/2023    1:33 PM 07/09/2022    9:59 AM 04/08/2022    9:30 AM 04/05/2022   12:07 PM  Fall Risk   Falls in the past year? 0 0 0 0 0  Number falls in past yr: 0    0  Injury with Fall? 0    0  Risk for fall due to : No Fall Risks    No Fall Risks  Follow up Falls prevention discussed;Education provided;Falls evaluation completed    Falls prevention discussed    MEDICARE RISK AT HOME: Medicare Risk at Home Any stairs in or around the home?: No If so, are there any without handrails?: No Home free of loose throw rugs in walkways, pet beds, electrical cords, etc?: Yes Adequate  lighting in your home to reduce risk of falls?: Yes Life alert?: No Use of a cane, walker or w/c?: No Grab bars in the bathroom?: Yes Shower chair or bench in shower?: No Elevated toilet seat or a handicapped toilet?: Yes  TIMED UP AND GO:  Was the test performed?  No    Cognitive Function:    12/23/2017    2:49 PM  MMSE - Mini Mental State Exam  Orientation to time 5  Orientation to Place 5  Registration 3  Attention/ Calculation 5  Recall 3  Language- name 2 objects 2  Language- repeat 1  Language- follow 3 step command 3  Language- read & follow direction 1  Write a sentence 1  Copy design 1  Total score 30        04/21/2023    2:24 PM 04/05/2022   12:12 PM 04/03/2021    8:26 AM 04/01/2020    8:39 AM  6CIT Screen  What Year? 0 points 0 points 0 points 0 points  What month? 0 points 0 points 0 points 0 points  What time? 0 points 0 points 0 points 0 points  Count back from 20 0 points 0 points 0 points 0 points  Months in reverse 0 points 0 points 0 points 0 points  Repeat phrase 0 points 0 points 0 points 0 points  Total Score 0 points 0 points 0 points 0 points    Immunizations  There is no immunization history on file for this patient.  TDAP status: Due, Education has been provided regarding the importance of this vaccine. Advised may receive this vaccine at local pharmacy or Health Dept. Aware to provide a copy of the vaccination record if obtained from local pharmacy or Health Dept. Verbalized acceptance and understanding.  Flu Vaccine status: Declined, Education has been provided regarding the importance of this vaccine but patient still declined. Advised may receive this vaccine at local pharmacy or Health Dept.  Aware to provide a copy of the vaccination record if obtained from local pharmacy or Health Dept. Verbalized acceptance and understanding.  Pneumococcal vaccine status: Declined,  Education has been provided regarding the importance of this vaccine  but patient still declined. Advised may receive this vaccine at local pharmacy or Health Dept. Aware to provide a copy of the vaccination record if obtained from local pharmacy or Health Dept. Verbalized acceptance and understanding.   Covid-19 vaccine status: Declined, Education has been provided regarding the importance of this vaccine but patient still declined. Advised may receive this vaccine at local pharmacy or Health Dept.or vaccine clinic. Aware to provide a copy of the vaccination record if obtained from local pharmacy or Health Dept. Verbalized acceptance and understanding.  Qualifies for Shingles Vaccine? Yes   Zostavax completed No   Shingrix Completed?: No.    Education has been provided regarding the importance of this vaccine. Patient has been advised to call insurance company to determine out of pocket expense if they have not yet received this vaccine. Advised may also receive vaccine at local pharmacy or Health Dept. Verbalized acceptance and understanding.  Screening Tests Health Maintenance  Topic Date Due   Pneumonia Vaccine 92+ Years old (1 of 2 - PCV) Never done   DTaP/Tdap/Td (1 - Tdap) Never done   Zoster Vaccines- Shingrix (1 of 2) Never done   Lung Cancer Screening  09/23/2011   FOOT EXAM  01/07/2023   Hepatitis C Screening  07/09/2023 (Originally 06/10/1968)   INFLUENZA VACCINE  07/18/2023 (Originally 11/18/2022)   Diabetic kidney evaluation - Urine ACR  07/09/2023   HEMOGLOBIN A1C  07/12/2023   Diabetic kidney evaluation - eGFR measurement  01/12/2024   OPHTHALMOLOGY EXAM  03/09/2024   Medicare Annual Wellness (AWV)  04/20/2024   HPV VACCINES  Aged Out   Colonoscopy  Discontinued   COVID-19 Vaccine  Discontinued    Health Maintenance  Health Maintenance Due  Topic Date Due   Pneumonia Vaccine 67+ Years old (1 of 2 - PCV) Never done   DTaP/Tdap/Td (1 - Tdap) Never done   Zoster Vaccines- Shingrix (1 of 2) Never done   Lung Cancer Screening  09/23/2011    FOOT EXAM  01/07/2023    Colorectal cancer screening:  Patient declines further at this time   Lung Cancer Screening: (Low Dose CT Chest recommended if Age 59-80 years, 20 pack-year currently smoking OR have quit w/in 15years.) does qualify.   Lung Cancer Screening Referral: Declines at this time   Additional Screening:  Hepatitis C Screening: does qualify  Vision Screening: Recommended annual ophthalmology exams for early detection of glaucoma and other disorders of the eye. Is the patient up to date with their annual eye exam?  Yes  Who is the provider or what is the name of the office in which the patient attends annual eye exams? In office  If pt is not established with a provider, would they like to be referred to a provider to establish care? No .   Dental Screening: Recommended annual dental exams for proper oral hygiene  Diabetic Foot Exam: Diabetic Foot Exam: Overdue, Pt has been advised about the importance in completing this exam. Pt is scheduled for diabetic foot exam on at next office visit.  Community Resource Referral / Chronic Care Management: CRR required this visit?  No   CCM required this visit?  No     Plan:     I have personally reviewed and noted the following in  the patient's chart:   Medical and social history Use of alcohol, tobacco or illicit drugs  Current medications and supplements including opioid prescriptions. Patient is not currently taking opioid prescriptions. Functional ability and status Nutritional status Physical activity Advanced directives List of other physicians Hospitalizations, surgeries, and ER visits in previous 12 months Vitals Screenings to include cognitive, depression, and falls Referrals and appointments  In addition, I have reviewed and discussed with patient certain preventive protocols, quality metrics, and best practice recommendations. A written personalized care plan for preventive services as well as general  preventive health recommendations were provided to patient.     Lavelle Pfeiffer [place], [place]   11/23/7972   After Visit Summary: (Mail) Due to this being a telephonic visit, the after visit summary with patients personalized plan was offered to patient via mail   Nurse Notes: No concerns at this time

## 2023-04-21 NOTE — Patient Instructions (Signed)
 Philip [name] , Thank you for taking time to come for your Medicare Wellness Visit. I appreciate your ongoing commitment to your health goals. Please review the following plan we discussed and let me know if I can assist you in the future.   Referrals/Orders/Follow-Ups/Clinician Recommendations: Aim for 30 minutes of exercise or brisk walking, 6-8 glasses of water, and 5 servings of fruits and vegetables each day.  This is a list of the screening recommended for you and due dates:  Health Maintenance  Topic Date Due   Pneumonia Vaccine (1 of 2 - PCV) Never done   DTaP/Tdap/Td vaccine (1 - Tdap) Never done   Zoster (Shingles) Vaccine (1 of 2) Never done   Screening for Lung Cancer  09/23/2011   Complete foot exam   01/07/2023   Hepatitis C Screening  07/09/2023*   Flu Shot  07/18/2023*   Yearly kidney health urinalysis for diabetes  07/09/2023   Hemoglobin A1C  07/12/2023   Yearly kidney function blood test for diabetes  01/12/2024   Eye exam for diabetics  03/09/2024   Medicare Annual Wellness Visit  04/20/2024   HPV Vaccine  Aged Out   Colon Cancer Screening  Discontinued   COVID-19 Vaccine  Discontinued  *Topic was postponed. The date shown is not the original due date.    Advanced directives: (ACP Link)Information on Advanced Care Planning can be found at [place]  Secretary of [facility] Advance Health Care Directives Advance Health Care Directives ([email or web address]   Next Medicare Annual Wellness Visit scheduled for next year: Yes  I

## 2023-04-22 ENCOUNTER — Ambulatory Visit: Payer: Medicare HMO | Admitting: Family Medicine

## 2023-04-22 ENCOUNTER — Encounter: Payer: Self-pay | Admitting: Family Medicine

## 2023-04-22 VITALS — BP 150/78 | HR 104 | Ht 68.5 in

## 2023-04-22 DIAGNOSIS — Z7984 Long term (current) use of oral hypoglycemic drugs: Secondary | ICD-10-CM | POA: Diagnosis not present

## 2023-04-22 DIAGNOSIS — I152 Hypertension secondary to endocrine disorders: Secondary | ICD-10-CM | POA: Diagnosis not present

## 2023-04-22 DIAGNOSIS — E785 Hyperlipidemia, unspecified: Secondary | ICD-10-CM

## 2023-04-22 DIAGNOSIS — I10 Essential (primary) hypertension: Secondary | ICD-10-CM | POA: Diagnosis not present

## 2023-04-22 DIAGNOSIS — E1159 Type 2 diabetes mellitus with other circulatory complications: Secondary | ICD-10-CM

## 2023-04-22 DIAGNOSIS — E119 Type 2 diabetes mellitus without complications: Secondary | ICD-10-CM

## 2023-04-22 DIAGNOSIS — E782 Mixed hyperlipidemia: Secondary | ICD-10-CM

## 2023-04-22 LAB — LIPID PANEL

## 2023-04-22 LAB — BAYER DCA HB A1C WAIVED: HB A1C (BAYER DCA - WAIVED): 5.7 % — ABNORMAL HIGH (ref 4.8–5.6)

## 2023-04-22 MED ORDER — ATORVASTATIN CALCIUM 40 MG PO TABS: 40.0000 mg | ORAL_TABLET | Freq: Every day | ORAL | 0 refills | Status: DC

## 2023-04-22 NOTE — Progress Notes (Addendum)
 BP (!) 150/78   Pulse (!) 104   Ht 5' 8.5 (1.74 m)   SpO2 98%   BMI 28.02 kg/m    Subjective:   Patient ID: Philip [name], male    DOB: [birth year], 73 y.o.   MRN: [id number]  HPI: Philip [name] is a 73 y.o. male presenting on 04/22/2023 for Medical Management of Chronic Issues and Diabetes   HPI Type 2 diabetes mellitus Patient comes in today for recheck of his diabetes. Patient has been currently taking Rybelsus  and metformin . Patient is currently on an ACE inhibitor/ARB. Patient has seen an ophthalmologist this year. Patient denies any new issues with their feet except some small calluses that he keeps an eye on. The symptom started onset as an adult hypertension and hyperlipidemia and history of CVA ARE RELATED TO DM   Hypertension and history of CVA Patient is currently on lisinopril , and their blood pressure today is 150/70. Patient denies any lightheadedness or dizziness. Patient denies headaches, blurred vision, chest pains, shortness of breath, or weakness. Denies any side effects from medication and is content with current medication.   Hyperlipidemia and history of CVA Patient is coming in for recheck of his hyperlipidemia. The patient is currently taking atorvastatin  and aspirin . They deny any issues with myalgias or history of liver damage from it. They deny any focal numbness or weakness or chest pain.   Relevant past medical, surgical, family and social history reviewed and updated as indicated. Interim medical history since our last visit reviewed. Allergies and medications reviewed and updated.  Review of Systems  Constitutional:  Negative for chills and fever.  Eyes:  Negative for visual disturbance.  Respiratory:  Negative for shortness of breath and wheezing.   Cardiovascular:  Negative for chest pain and leg swelling.  Skin:  Negative for rash.  Neurological:  Negative for dizziness, weakness and light-headedness.  All other systems reviewed and are  negative.   Per HPI unless specifically indicated above   Allergies as of 04/22/2023       Reactions   Ativan  [lorazepam ] Other (See Comments)   Caused patient not to remember several days.   Blueberry [vaccinium Angustifolium] Swelling   Fish Oil    Glucosamine Forte [nutritional Supplements]    Weak, flu like symptoms   Shellfish Allergy    Ultram [tramadol Hcl] Hives        Medication List        Accurate as of April 22, 2023 11:24 AM. If you have any questions, ask your nurse or doctor.          aspirin  EC 81 MG tablet Take 81 mg by mouth in the morning and at bedtime.   atorvastatin  40 MG tablet Commonly known as: LIPITOR Take 1 tablet by mouth once daily   cyclobenzaprine  10 MG tablet Commonly known as: FLEXERIL  Take 1 tablet (10 mg total) by mouth 3 (three) times daily as needed for muscle spasms.   lisinopril  10 MG tablet Commonly known as: ZESTRIL  Take 1 tablet (10 mg total) by mouth daily.   metFORMIN  1000 MG tablet Commonly known as: GLUCOPHAGE  Take 1 tablet (1,000 mg total) by mouth 2 (two) times daily with a meal.   Rybelsus  7 MG Tabs Generic drug: Semaglutide  Take 1 tablet (7 mg total) by mouth daily.         Objective:   BP (!) 150/78   Pulse (!) 104   Ht 5' 8.5 (1.74 m)   SpO2 98%   BMI  28.02 kg/m   Wt Readings from Last 3 Encounters:  04/21/23 187 lb (84.8 kg)  01/12/23 187 lb (84.8 kg)  10/11/22 181 lb (82.1 kg)    Physical Exam Vitals and nursing note reviewed.  Constitutional:      General: He is not in acute distress.    Appearance: He is well-developed. He is not diaphoretic.  Eyes:     General: No scleral icterus.    Conjunctiva/sclera: Conjunctivae normal.  Neck:     Thyroid : No thyromegaly.  Cardiovascular:     Rate and Rhythm: Normal rate and regular rhythm.     Heart sounds: Normal heart sounds. No murmur heard. Pulmonary:     Effort: Pulmonary effort is normal. No respiratory distress.     Breath  sounds: Normal breath sounds. No wheezing.  Musculoskeletal:        General: No swelling. Normal range of motion.     Cervical back: Neck supple.  Lymphadenopathy:     Cervical: No cervical adenopathy.  Skin:    General: Skin is warm and dry.     Findings: No rash.  Neurological:     Mental Status: He is alert and oriented to person, place, and time.     Coordination: Coordination normal.  Psychiatric:        Behavior: Behavior normal.     Results for orders placed or performed in visit on 03/30/23  HM DIABETES EYE EXAM   Collection Time: 03/10/23 12:00 AM  Result Value Ref Range   HM Diabetic Eye Exam No Retinopathy No Retinopathy    Assessment & Plan:   Problem List Items Addressed This Visit       Cardiovascular and Mediastinum   Hypertension associated with type 2 diabetes mellitus (HCC)   Relevant Medications   atorvastatin  (LIPITOR) 40 MG tablet     Endocrine   Diabetes (HCC) - Primary   Relevant Medications   atorvastatin  (LIPITOR) 40 MG tablet   Other Relevant Orders   CBC with Differential/Platelet (Completed)   CMP14+EGFR (Completed)   Lipid panel (Completed)   Bayer DCA Hb A1c Waived (Completed)   PSA, total and free (Completed)     Other   Hyperlipidemia (Chronic)   Relevant Medications   atorvastatin  (LIPITOR) 40 MG tablet   Other Relevant Orders   CBC with Differential/Platelet (Completed)   CMP14+EGFR (Completed)   Lipid panel (Completed)   Bayer DCA Hb A1c Waived (Completed)   PSA, total and free (Completed)    A1c 5.7, blood pressure slightly elevated and he will monitor at home and watch it closely. Follow up plan: Return in about 3 months (around 07/21/2023), or if symptoms worsen or fail to improve, for Diabetes.  Counseling provided for all of the vaccine components Orders Placed This Encounter  Procedures   CBC with Differential/Platelet   CMP14+EGFR   Lipid panel   Bayer DCA Hb A1c Waived   PSA, total and free    Fonda Levins, MD [facility] 04/22/2023, 11:24 AM

## 2023-04-23 LAB — CBC WITH DIFFERENTIAL/PLATELET
Basophils Absolute: 0.1 10*3/uL (ref 0.0–0.2)
Basos: 1 %
EOS (ABSOLUTE): 0.2 10*3/uL (ref 0.0–0.4)
Eos: 2 %
Hematocrit: 42.4 % (ref 37.5–51.0)
Hemoglobin: 14.3 g/dL (ref 13.0–17.7)
Immature Grans (Abs): 0 10*3/uL (ref 0.0–0.1)
Immature Granulocytes: 0 %
Lymphocytes Absolute: 2.6 10*3/uL (ref 0.7–3.1)
Lymphs: 26 %
MCH: 31.2 pg (ref 26.6–33.0)
MCHC: 33.7 g/dL (ref 31.5–35.7)
MCV: 92 fL (ref 79–97)
Monocytes Absolute: 0.8 10*3/uL (ref 0.1–0.9)
Monocytes: 7 %
Neutrophils Absolute: 6.5 10*3/uL (ref 1.4–7.0)
Neutrophils: 64 %
Platelets: 325 10*3/uL (ref 150–450)
RBC: 4.59 x10E6/uL (ref 4.14–5.80)
RDW: 12.9 % (ref 11.6–15.4)
WBC: 10.1 10*3/uL (ref 3.4–10.8)

## 2023-04-23 LAB — LIPID PANEL
Cholesterol, Total: 141 mg/dL (ref 100–199)
HDL: 46 mg/dL (ref >39)
LDL CALC COMMENT:: 3.1 ratio (ref 0.0–5.0)
LDL Chol Calc (NIH): 74 mg/dL (ref 0–99)
Triglycerides: 118 mg/dL (ref 0–149)
VLDL Cholesterol Cal: 21 mg/dL (ref 5–40)

## 2023-04-23 LAB — CMP14+EGFR
ALT: 10 [IU]/L (ref 0–44)
AST: 18 [IU]/L (ref 0–40)
Albumin: 4.6 g/dL (ref 3.8–4.8)
Alkaline Phosphatase: 108 [IU]/L (ref 44–121)
BUN/Creatinine Ratio: 10 (ref 10–24)
BUN: 8 mg/dL (ref 8–27)
Bilirubin Total: 0.5 mg/dL (ref 0.0–1.2)
CO2: 21 mmol/L (ref 20–29)
Calcium: 11 mg/dL — ABNORMAL HIGH (ref 8.6–10.2)
Chloride: 104 mmol/L (ref 96–106)
Creatinine, Ser: 0.79 mg/dL (ref 0.76–1.27)
Globulin, Total: 2.1 g/dL (ref 1.5–4.5)
Glucose: 112 mg/dL — ABNORMAL HIGH (ref 70–99)
Potassium: 4.7 mmol/L (ref 3.5–5.2)
Sodium: 142 mmol/L (ref 134–144)
Total Protein: 6.7 g/dL (ref 6.0–8.5)
eGFR: 95 mL/min/{1.73_m2} (ref >59)

## 2023-04-23 LAB — PSA, TOTAL AND FREE
PSA, Free Pct: 32.9 %
PSA, Free: 0.23 ng/mL
Prostate Specific Ag, Serum: 0.7 ng/mL (ref 0.0–4.0)

## 2023-07-22 ENCOUNTER — Ambulatory Visit: Payer: Medicare HMO | Admitting: Family Medicine

## 2023-07-22 ENCOUNTER — Encounter: Payer: Self-pay | Admitting: Family Medicine

## 2023-07-22 VITALS — BP 186/83 | HR 98 | Ht 68.5 in | Wt 186.0 lb

## 2023-07-22 DIAGNOSIS — E119 Type 2 diabetes mellitus without complications: Secondary | ICD-10-CM | POA: Diagnosis not present

## 2023-07-22 DIAGNOSIS — Z7984 Long term (current) use of oral hypoglycemic drugs: Secondary | ICD-10-CM

## 2023-07-22 DIAGNOSIS — I152 Hypertension secondary to endocrine disorders: Secondary | ICD-10-CM | POA: Diagnosis not present

## 2023-07-22 DIAGNOSIS — E1159 Type 2 diabetes mellitus with other circulatory complications: Secondary | ICD-10-CM | POA: Diagnosis not present

## 2023-07-22 DIAGNOSIS — Z87891 Personal history of nicotine dependence: Secondary | ICD-10-CM | POA: Diagnosis not present

## 2023-07-22 DIAGNOSIS — E782 Mixed hyperlipidemia: Secondary | ICD-10-CM | POA: Diagnosis not present

## 2023-07-22 LAB — BAYER DCA HB A1C WAIVED: HB A1C (BAYER DCA - WAIVED): 6 % — ABNORMAL HIGH (ref 4.8–5.6)

## 2023-07-22 NOTE — Progress Notes (Signed)
 BP (!) 186/83   Pulse 98   Ht 5' 8.5" (1.74 m)   Wt 186 lb (84.4 kg)   SpO2 96%   BMI 27.87 kg/m    Subjective:   Patient ID: Dionysios Massman, male    DOB: 09-05-1950, 73 y.o.   MRN: [id number]  HPI: Shariff Lasky is a 73 y.o. male presenting on 07/22/2023 for Medical Management of Chronic Issues and Diabetes   HPI Type 2 diabetes mellitus Patient comes in today for recheck of his diabetes. Patient has been currently taking metformin and Rybelsus. Patient is currently on an ACE inhibitor/ARB. Patient has not seen an ophthalmologist this year. Patient denies any new issues with their feet. The symptom started onset as an adult hypertension and hyperlipidemia ARE RELATED TO DM   Hypertension Patient is currently on lisinopril although he has not been taking it over the past month, and their blood pressure today is 186/63. Patient denies any lightheadedness or dizziness. Patient denies headaches, blurred vision, chest pains, shortness of breath, or weakness. Denies any side effects from medication and is content with current medication.   Hyperlipidemia Patient is coming in for recheck of his hyperlipidemia. The patient is currently taking atorvastatin. They deny any issues with myalgias or history of liver damage from it. They deny any focal numbness or weakness or chest pain.   Relevant past medical, surgical, family and social history reviewed and updated as indicated. Interim medical history since our last visit reviewed. Allergies and medications reviewed and updated.  Review of Systems  Constitutional:  Negative for chills and fever.  Eyes:  Negative for visual disturbance.  Respiratory:  Negative for shortness of breath and wheezing.   Cardiovascular:  Negative for chest pain and leg swelling.  Musculoskeletal:  Negative for back pain and gait problem.  Skin:  Negative for rash.  Neurological:  Negative for dizziness and light-headedness.  All other systems reviewed and are  negative.   Per HPI unless specifically indicated above   Allergies as of 07/22/2023       Reactions   Ativan [lorazepam] Other (See Comments)   Caused patient not to remember several days.   Blueberry [vaccinium Angustifolium] Swelling   Fish Oil    Glucosamine Forte [nutritional Supplements]    Weak, flu like symptoms   Shellfish Allergy    Ultram [tramadol Hcl] Hives        Medication List        Accurate as of July 22, 2023  1:18 PM. If you have any questions, ask your nurse or doctor.          aspirin EC 81 MG tablet Take 81 mg by mouth in the morning and at bedtime.   atorvastatin 40 MG tablet Commonly known as: LIPITOR Take 1 tablet (40 mg total) by mouth daily.   cyclobenzaprine 10 MG tablet Commonly known as: FLEXERIL Take 1 tablet (10 mg total) by mouth 3 (three) times daily as needed for muscle spasms.   lisinopril 10 MG tablet Commonly known as: ZESTRIL Take 1 tablet (10 mg total) by mouth daily.   metFORMIN 1000 MG tablet Commonly known as: GLUCOPHAGE Take 1 tablet (1,000 mg total) by mouth 2 (two) times daily with a meal.   Rybelsus 7 MG Tabs Generic drug: Semaglutide Take 1 tablet (7 mg total) by mouth daily.         Objective:   BP (!) 186/83   Pulse 98   Ht 5' 8.5" (1.74 m)  Wt 186 lb (84.4 kg)   SpO2 96%   BMI 27.87 kg/m   Wt Readings from Last 3 Encounters:  07/22/23 186 lb (84.4 kg)  04/21/23 187 lb (84.8 kg)  01/12/23 187 lb (84.8 kg)    Physical Exam Vitals and nursing note reviewed.  Constitutional:      General: He is not in acute distress.    Appearance: He is well-developed. He is not diaphoretic.  Eyes:     General: No scleral icterus.    Conjunctiva/sclera: Conjunctivae normal.  Neck:     Thyroid: No thyromegaly.  Cardiovascular:     Rate and Rhythm: Normal rate and regular rhythm.     Heart sounds: Normal heart sounds. No murmur heard. Pulmonary:     Effort: Pulmonary effort is normal. No respiratory  distress.     Breath sounds: Normal breath sounds. No wheezing.  Musculoskeletal:        General: No swelling. Normal range of motion.     Cervical back: Neck supple.  Lymphadenopathy:     Cervical: No cervical adenopathy.  Skin:    General: Skin is warm and dry.     Findings: No rash.  Neurological:     Mental Status: He is alert and oriented to person, place, and time.     Coordination: Coordination normal.  Psychiatric:        Behavior: Behavior normal.       Assessment & Plan:   Problem List Items Addressed This Visit       Cardiovascular and Mediastinum   Hypertension associated with type 2 diabetes mellitus (HCC)   Relevant Orders   AMB Referral VBCI Care Management     Endocrine   Diabetes (HCC) - Primary   Relevant Orders   Bayer DCA Hb A1c Waived   Microalbumin/Creatinine Ratio, Urine   AMB Referral VBCI Care Management     Other   Hyperlipidemia (Chronic)   Other Visit Diagnoses       Stopped smoking with greater than 30 pack year history       Relevant Orders   Ambulatory Referral Lung Cancer Screening [facility]       A1c 6.0, looks good. Blood pressure elevated, will restart the lisinopril that he has not had for the past month Follow up plan: Return in about 3 months (around 10/21/2023), or if symptoms worsen or fail to improve, for Diabetes and hypertension and cholesterol.  Counseling provided for all of the vaccine components Orders Placed This Encounter  Procedures   Bayer DCA Hb A1c Waived   Microalbumin/Creatinine Ratio, Urine   Ambulatory Referral Lung Cancer Screening  Pulmonary   AMB Referral VBCI Care Management    Arville Care, MD [facility] 07/22/2023, 1:18 PM

## 2023-07-24 LAB — MICROALBUMIN / CREATININE URINE RATIO
Creatinine, Urine: 29.4 mg/dL
Microalb/Creat Ratio: 19 mg/g{creat} (ref 0–29)
Microalbumin, Urine: 5.7 ug/mL

## 2023-07-26 ENCOUNTER — Other Ambulatory Visit

## 2023-07-26 ENCOUNTER — Other Ambulatory Visit: Payer: Self-pay

## 2023-07-26 DIAGNOSIS — Z125 Encounter for screening for malignant neoplasm of prostate: Secondary | ICD-10-CM | POA: Diagnosis not present

## 2023-07-26 DIAGNOSIS — E291 Testicular hypofunction: Secondary | ICD-10-CM

## 2023-07-26 DIAGNOSIS — I10 Essential (primary) hypertension: Secondary | ICD-10-CM

## 2023-07-26 MED ORDER — LISINOPRIL 10 MG PO TABS: 10.0000 mg | ORAL_TABLET | Freq: Every day | ORAL | 3 refills | Status: DC

## 2023-07-29 LAB — TESTOSTERONE,FREE AND TOTAL
Testosterone, Free: 0.9 pg/mL — ABNORMAL LOW (ref 6.6–18.1)
Testosterone: 249 ng/dL — ABNORMAL LOW (ref 264–916)

## 2023-08-09 ENCOUNTER — Telehealth: Payer: Self-pay

## 2023-08-09 NOTE — Progress Notes (Signed)
 Care Guide Pharmacy Note  08/09/2023 Name: Caroll Cunnington MRN: [id number] DOB: 01/30/1951  Referred By: Hilton Lucky, MD Reason for referral: Complex Care Management (Outreach to schedule with Pharm d )   Gurdeep Keesey is a 73 y.o. year old male who is a primary care patient of Dettinger, Lucio Sabin, MD.  Lennyn Bellanca was referred to the pharmacist for assistance related to: DMII  Successful contact was made with the patient to discuss pharmacy services including being ready for the pharmacist to call at least 5 minutes before the scheduled appointment time and to have medication bottles and any blood pressure readings ready for review. The patient agreed to meet with the pharmacist via telephone visit on (date/time).09/02/2023  Lenton Rail , RMA     [place]  [facility], [facility] Guide  Direct Dial: [phone]  Website: [email or web address]

## 2023-08-29 ENCOUNTER — Ambulatory Visit (INDEPENDENT_AMBULATORY_CARE_PROVIDER_SITE_OTHER): Admitting: Nurse Practitioner

## 2023-08-29 ENCOUNTER — Ambulatory Visit (INDEPENDENT_AMBULATORY_CARE_PROVIDER_SITE_OTHER)

## 2023-08-29 VITALS — BP 160/95 | HR 117 | Temp 97.6°F

## 2023-08-29 DIAGNOSIS — M25511 Pain in right shoulder: Secondary | ICD-10-CM

## 2023-08-29 DIAGNOSIS — M19011 Primary osteoarthritis, right shoulder: Secondary | ICD-10-CM | POA: Diagnosis not present

## 2023-08-29 MED ORDER — CYCLOBENZAPRINE HCL 10 MG PO TABS: 10.0000 mg | ORAL_TABLET | Freq: Three times a day (TID) | ORAL | 2 refills | Status: AC | PRN

## 2023-08-29 MED ORDER — METHYLPREDNISOLONE 4 MG PO TBPK: ORAL_TABLET | ORAL | 0 refills | Status: DC

## 2023-08-29 NOTE — Progress Notes (Signed)
 Acute Office Visit  Subjective:     Patient ID: Philip [name], male    DOB: Oct 19, 1950, 73 y.o.   MRN: [id number]  Chief Complaint  Patient presents with   right arm weakness    Chief Complaint Patient presents with right arm weakness for 6 days, has gotten worse,painful, swollen, hand numbness      HPI  The patient is a 73 year old male who presents with a 6-day history of progressive right arm weakness. He reports that the weakness has worsened over time and is now accompanied by significant pain, swelling, and numbness in the hand. He describes the pain as severe (10/10), radiating from the shoulder down the entire arm. He also reports a loss of sensation in the right hand. Past medical history is notable for right shoulder atrophy. He denies any recent trauma or known injury. The clinical presentation raises concern for a possible neurological or vascular etiology. The patient was advised to seek immediate emergency department evaluation for further workup, including imaging and possible intervention, but he declined, stating, "I am not paying $500 like last time." Smokes 1/2 pack daily  Active Ambulatory Problems    Diagnosis Date Noted   Hypertension associated with type 2 diabetes mellitus (HCC) 09/24/2010   Diabetes (HCC) 10/30/2012   Panic disorder 10/30/2012   Hypercalcemia 06/19/2013   Vitamin D  deficiency 08/20/2013   Hyperlipidemia    PTSD (post-traumatic stress disorder) 07/23/2014   S/P lumbar microdiscectomy 08/01/2014   Status post CVA 08/14/2019   Screening for cardiovascular condition 05/27/2022   Nicotine abuse 05/27/2022   Sinus tachycardia 05/27/2022   ERRONEOUS ENCOUNTER--DISREGARD 11/30/2022   Acute pain of right shoulder 08/29/2023   Resolved Ambulatory Problems    Diagnosis Date Noted   Chest pain 09/24/2010   Dyslipidemia 09/24/2010   Shortness of breath 10/30/2012   Non compliance with medical treatment 10/30/2012   HLD (hyperlipidemia)  08/20/2013   Sciatica 07/23/2014   Altered awareness, transient 07/23/2014   Leukocytosis 07/24/2014   Sciatica neuralgia    Depression 08/16/2016   Numbness 08/13/2019   Ataxia 08/13/2019   Past Medical History:  Diagnosis Date   Chest pain    Diabetes mellitus without complication (HCC)    Hypertension      Review of Systems  Constitutional:  Negative for chills and fever.  HENT:  Negative for congestion and sore throat.   Respiratory:  Negative for cough and shortness of breath.   Cardiovascular:  Negative for chest pain and leg swelling.  Gastrointestinal:  Negative for constipation, diarrhea, nausea and vomiting.  Musculoskeletal:  Positive for joint pain.       Right shoulder  Skin:  Negative for itching and rash.  Neurological:  Negative for dizziness and headaches.   Negative unless indicated in HPI    Objective:    BP (!) 160/95   Pulse (!) 117   Temp 97.6 F (36.4 C) (Temporal)   SpO2 99%  BP Readings from Last 3 Encounters:  08/29/23 (!) 160/95  07/22/23 (!) 186/83  04/22/23 (!) 150/78   Wt Readings from Last 3 Encounters:  07/22/23 186 lb (84.4 kg)  04/21/23 187 lb (84.8 kg)  01/12/23 187 lb (84.8 kg)      Physical Exam Vitals and nursing note reviewed.  Constitutional:      Appearance: Normal appearance.  HENT:     Head: Normocephalic and atraumatic.     Nose: Nose normal.  Eyes:     General: No scleral icterus.  Extraocular Movements: Extraocular movements intact.     Conjunctiva/sclera: Conjunctivae normal.     Pupils: Pupils are equal, round, and reactive to light.  Cardiovascular:     Heart sounds: Normal heart sounds.  Pulmonary:     Effort: Pulmonary effort is normal.     Breath sounds: Normal breath sounds.  Neurological:     Mental Status: He is alert.    X-ray: 1. 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 2. 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  No results found for any visits on 08/29/23.      Assessment & Plan:  Acute pain of right shoulder -     Cyclobenzaprine  HCl; Take 1 tablet (10 mg total) by mouth 3 (three) times daily as needed for muscle spasms.  Dispense: 30 tablet; Refill: 2 -     methylPREDNISolone ; Follow the instruction on the box  Dispense: 21 tablet; Refill: 0 -     DG Shoulder Right  Arthropathy of right shoulder -     Cyclobenzaprine  HCl; Take 1 tablet (10 mg total) by mouth 3 (three) times daily as needed for muscle spasms.  Dispense: 30 tablet; Refill: 2 -     methylPREDNISolone ; Follow the instruction on the box  Dispense: 21 tablet; Refill: 0 -     DG Shoulder Right   Taegan is a 73 year old Caucasian male seen today for right shoulder pain, no acute distress Flexeril  10 mg 3 times daily refill, start methylprednisolone  Dosepak #21 dispense -Heat every 15 minutes as tolerated  The above assessment and management plan was discussed with the patient. The patient verbalized understanding of and has agreed to the management plan. Patient is aware to call the clinic if they develop any new symptoms or if symptoms persist or worsen. Patient is aware when to return to the clinic for a follow-up visit. Patient educated on when it is appropriate to go to the emergency department.  Return if symptoms worsen or fail to improve.  Melia Hopes [place], DNP [facility] [address] [place], [place] [address] [phone]  Note: This document was prepared by [employer] voice dictation technology and any errors that results from this process are unintentional.

## 2023-08-30 ENCOUNTER — Ambulatory Visit: Payer: Self-pay

## 2023-09-02 ENCOUNTER — Other Ambulatory Visit

## 2023-09-02 ENCOUNTER — Telehealth: Payer: Self-pay | Admitting: Pharmacist

## 2023-09-02 DIAGNOSIS — Z7984 Long term (current) use of oral hypoglycemic drugs: Secondary | ICD-10-CM

## 2023-09-02 DIAGNOSIS — E119 Type 2 diabetes mellitus without complications: Secondary | ICD-10-CM

## 2023-09-02 NOTE — Telephone Encounter (Signed)
 Can you send me [employer] PAP forms for Rybelsus  7mg ?

## 2023-09-02 NOTE — Progress Notes (Signed)
 09/15/2023 Name: Philip [name] MRN: [id number] DOB: [birth year]  Chief Complaint  Patient presents with   Diabetes   Medication Assistance    Philip [name] is a 73 y.o. year old male who presented for a telephone visit.  I connected with  Amour Cutrone on 09/15/23 by telephone and verified that I am speaking with the correct person using two identifiers.  I discussed the limitations of evaluation and management by telemedicine. The patient expressed understanding and agreed to proceed.  Patient was located in her home and PharmD in PCP office during this visit.   They were referred to the pharmacist by their PCP for assistance in managing diabetes and medication access.    Subjective:  Patient needs assistance with the cost of Rybelsus .  He has been previously enrolled in [employer] patient assistance program in the past  Care Team: Primary Care Provider: Dettinger, Lucio Sabin, MD    Medication Access/Adherence  Current Pharmacy:  [facility] [address], [place] - [address] [place] [address] Phone: [phone] Fax: [phone]  Patient reports affordability concerns with their medications: Yes  Patient reports access/transportation concerns to their pharmacy: No  Patient reports adherence concerns with their medications:  No    Diabetes:  Current medications: Rybelsus , metformin  Medications tried in the past: canagliflozin , empagliflozin , glipizide , sitagliptin   Current glucose readings: FBG<130 Uses traditional glucometer Very compliant with fingersticks   Patient denies hypoglycemic s/sx including dizziness, shakiness, sweating. Patient denies hyperglycemic symptoms including polyuria, polydipsia, polyphagia, nocturia, neuropathy, blurred vision.  Current meal patterns:  Discussed meal planning options and Plate method for healthy eating Avoid sugary drinks and desserts Incorporate balanced protein, non starchy veggies, 1 serving  of carbohydrate with each meal Increase water intake Increase physical activity as able  Current physical activity: encouraged as able  Current medication access support: will enroll online in the [employer] PAP for Rybelsus    Objective:  Lab Results  Component Value Date   HGBA1C 6.0 (H) 07/22/2023    Lab Results  Component Value Date   CREATININE 0.79 04/22/2023   BUN 8 04/22/2023   NA 142 04/22/2023   K 4.7 04/22/2023   CL 104 04/22/2023   CO2 21 04/22/2023    Lab Results  Component Value Date   CHOL 141 04/22/2023   HDL 46 04/22/2023   LDLCALC 74 04/22/2023   TRIG 118 04/22/2023   CHOLHDL 3.1 04/22/2023    Medications Reviewed Today     Reviewed by Delilah Fend, [facility] (Pharmacist) on 09/15/23 at 1349  Med List Status: <None>   Medication Order Taking? Sig Documenting Provider Last Dose Status Informant  aspirin  EC 81 MG tablet [id number] No Take 81 mg by mouth in the morning and at bedtime.  [provider] Taking Active Self  atorvastatin  (LIPITOR) 40 MG tablet [id number] No Take 1 tablet (40 mg total) by mouth daily. Dettinger, Lucio Sabin, MD Taking Active   cyclobenzaprine  (FLEXERIL ) 10 MG tablet [id number]  Take 1 tablet (10 mg total) by mouth 3 (three) times daily as needed for muscle spasms. [place] Thompson, Adell Hones, NP  Active   lisinopril  (ZESTRIL ) 10 MG tablet [id number] No Take 1 tablet (10 mg total) by mouth daily. Dettinger, Lucio Sabin, MD Taking Active   metFORMIN  (GLUCOPHAGE ) 1000 MG tablet [id number] No Take 1 tablet (1,000 mg total) by mouth 2 (two) times daily with a meal. Dettinger, Lucio Sabin, MD Taking Active   methylPREDNISolone  (MEDROL  DOSEPAK) 4  MG TBPK tablet [id number]  Follow the instruction on the box [place] Greaser, Adell Hones, NP  Active   Semaglutide  (RYBELSUS ) 7 MG TABS [id number] No Take 1 tablet (7 mg total) by mouth daily. Dettinger, Lucio Sabin, MD Taking Active              Assessment/Plan:   Diabetes: - Currently  controlled - Reviewed long term cardiovascular and renal outcomes of uncontrolled blood sugar - Reviewed goal A1c, goal fasting, and goal 2 hour post prandial glucose - Reviewed dietary modifications including FOLLOWING A HEART HEALTHY DIET/HEALTHY PLATE METHOD - Recommend to: Continue Rybelsus  7mg  daily (sample provided and left up front for patient to pick up)  Continue Metformin  --> may be able to discontinue at follow up if controlled/at goal on Rybelsus  7mg  daily - Patient denies personal or family history of multiple endocrine neoplasia type 2, medullary thyroid  cancer; personal history of pancreatitis or gallbladder disease. - Recommend to check glucose daily (fasting) or if symptomatic - Meets financial criteria for Rybelsus  patient assistance program through [employer] patient assistance. Will collaborate with provider, CPhT, and patient to pursue assistance.     Follow Up Plan: 1 month   Marvell Slider, PharmD, BCACP, CPP Clinical Pharmacist, [facility]   30 min of patient care was provided to the patient during this visit time.  No charge visit

## 2023-09-05 ENCOUNTER — Other Ambulatory Visit (HOSPITAL_COMMUNITY): Payer: Self-pay

## 2023-09-05 ENCOUNTER — Telehealth: Payer: Self-pay

## 2023-09-05 NOTE — Progress Notes (Signed)
 Pharmacy Medication Assistance Program Note    09/05/2023  Patient ID: Philip [name], male   DOB: [birth year], 72 y.o.   MRN: [id number]     09/05/2023  Outreach Medication One  Manufacturer Medication One Novo Nordisk  Nordisk Drugs Rybelsus   Dose of Rybelsus  7MG   Type of Sport and exercise psychologist  Date Application Submitted to [employer] 09/05/2023  Method Application Sent to Sports administrator to [place].

## 2023-09-06 NOTE — Telephone Encounter (Signed)
 FYI- BE ON THE LOOK OUT  Copied from CRM [id number]. Topic: Clinical - Prescription Issue >> Sep 06, 2023  9:53 AM Everette C wrote: Reason for CRM: Nettie Barb with the [employer] patient assistance program called to share that information will be submitted to the practice for completion related to their assistance with coverage of diabetes medications  The patient's name will not be on forms only their initials, birthday and patient id number   Patient ID [phone]  Please contact [employer] further if needed   The information will be submitted to the practice today via fax

## 2023-09-06 NOTE — Telephone Encounter (Signed)
 Fax rec'd from novo [employer]ordisk: provider pages needing completion.   Pgs were emailed to [facility] 09/05/23

## 2023-09-19 NOTE — Progress Notes (Signed)
 Pharmacy Medication Assistance Program Note    09/19/2023  Patient ID: Philip [name], male   DOB: 30-Apr-1950, 73 y.o.   MRN: [id number]     09/05/2023  Outreach Medication One  Manufacturer Medication One Novo Nordisk  Nordisk Drugs Rybelsus   Dose of Rybelsus  7MG   Type of Sport and exercise psychologist  Date Application Submitted to Manufacturer 09/05/2023  Method Application Sent to Manufacturer Online  Patient Assistance Determination Approved  Approval Start Date 09/09/2023  Approval End Date 04/18/2024     RENEWAL APPROVED UNTIL 04/18/24  SHIPMENT PROCESSING 09/09/23

## 2023-09-22 ENCOUNTER — Telehealth: Payer: Self-pay | Admitting: Pharmacist

## 2023-09-22 NOTE — Telephone Encounter (Signed)
   Patient enrolled in the [employer] patient assistance program for Rybelsus .    Reports FBG is normally 83-130 One morning, his FBG was 121 then when he was out and about,  he "crashed" at lunch (unable to speak/mumbled).  He didn't have meter to check.  Only one episode No hypoglycemia noted on glucometer.  He does not wish to have CGM.  Discussed with patient that we may be able to d/c metformin  with Rybelsus  7mg  daily back on board Will continue to follow   Marvell Slider, PharmD, BCACP, CPP Clinical Pharmacist, [facility]

## 2023-09-27 NOTE — Progress Notes (Signed)
 09/28/2023 Name: Philip [name] MRN: [id number] DOB: May 18, 1950  Chief Complaint  Patient presents with   Diabetes   I connected with  Zerita Hill on 09/28/23 by telephone and verified that I am speaking with the correct person using two identifiers. I discussed the limitations of evaluation and management by telemedicine. The patient expressed understanding and agreed to proceed.  Patient was located in her home and PharmD in PCP office during this visit.  Philip [name] is a 73 y.o. year old male who presented for a telephone visit.   They were referred to the pharmacist by their PCP for assistance in managing diabetes.   Care Team: Primary Care Provider: Dettinger, Lucio Sabin, MD ; Next Scheduled Visit: 10/28/23  Medication Access/Adherence  Current Pharmacy:  [facility] [address] [place] - [address] [place] [address] Phone: [phone] Fax: [phone]   Patient reports affordability concerns with their medications: No  Patient reports access/transportation concerns to their pharmacy: No  Patient reports adherence concerns with their medications:  No    Subjective: Patient reports he picked up Rybelsus  PAP from clinic. Notes he has not had any further hypoglycemia episodes like the other week when he crashed while out at lunch. Sometimes will feel nausea/weak, but does not check his BG as he does not usually have the supplies with him. He decreased metformin  to 1000 mg once daily a few days ago to try to avoid this and has not noticed any symptoms since then. Planning to pick up an extra glucometer and supplies to keep in his car so he is able to check on the go tomorrow.  Diabetes:  Current medications: Rybelsus  7 mg daily, metformin  1000 mg BID (taking 1000 mg daily)  Using glucometer; testing once daily Fasting BG: 92-136; recalls checking once ~2 hrs after a meal and BG was 130s  Patient denies hypoglycemic s/sx including  dizziness, shakiness, sweating. Patient denies hyperglycemic symptoms including polyuria, polydipsia, polyphagia, nocturia, neuropathy, blurred vision.  Current meal patterns:  - Eats smaller portions - can of beans, pinto beans, green beans, mashed potatoes, salads, hamburger patty - does not snack much  Current physical activity: exercises a few times per week  Current medication access support: Novo PAP for Rybelsus   Objective:  Lab Results  Component Value Date   HGBA1C 6.0 (H) 07/22/2023    Lab Results  Component Value Date   CREATININE 0.79 04/22/2023   BUN 8 04/22/2023   NA 142 04/22/2023   K 4.7 04/22/2023   CL 104 04/22/2023   CO2 21 04/22/2023    Lab Results  Component Value Date   CHOL 141 04/22/2023   HDL 46 04/22/2023   LDLCALC 74 04/22/2023   TRIG 118 04/22/2023   CHOLHDL 3.1 04/22/2023    Medications Reviewed Today     Reviewed by Philmore Bream, RPH (Pharmacist) on 09/28/23 at 1213  Med List Status: <None>   Medication Order Taking? Sig Documenting Provider Last Dose Status Informant  aspirin  EC 81 MG tablet [id number]  Take 81 mg by mouth in the morning and at bedtime.  [provider]  Active Self  atorvastatin  (LIPITOR) 40 MG tablet [id number]  Take 1 tablet (40 mg total) by mouth daily. Dettinger, Lucio Sabin, MD  Active   cyclobenzaprine  (FLEXERIL ) 10 MG tablet [id number]  Take 1 tablet (10 mg total) by mouth 3 (three) times daily as needed for muscle spasms. [place] Thompson, Sandra, NP  Active  lisinopril  (ZESTRIL ) 10 MG tablet [id number]  Take 1 tablet (10 mg total) by mouth daily. Dettinger, Lucio Sabin, MD  Active   metFORMIN  (GLUCOPHAGE ) 1000 MG tablet [id number] Yes Take 1 tablet (1,000 mg total) by mouth 2 (two) times daily with a meal.  Patient taking differently: Take 1,000 mg by mouth daily.   Dettinger, Lucio Sabin, MD Taking Active   methylPREDNISolone  (MEDROL  DOSEPAK) 4 MG TBPK tablet [id number]  Follow the instruction on the box St  Verma Gobble, Adell Hones, NP  Active   Semaglutide  (RYBELSUS ) 7 MG TABS [id number] Yes Take 1 tablet (7 mg total) by mouth daily. Dettinger, Lucio Sabin, MD Taking Active            Med Note Alpheus Arvin, Haig Levan Sep 15, 2023  1:49 PM) Via novo nordisk patient assistance program                Assessment/Plan:   Diabetes: - Currently controlled with last A1C 6.0%, below goal <7%. Patient reported BG readings are mostly within goal. Agree with decreasing metformin  to 1000 mg once daily given episode of hypoglycemia and appears this has improved since he decreased the dose a few days ago. Encouraged him to check BG when he feels symptoms of low BG and reviewed appropriate management of hypoglycemia. Counseled to let us  know if he is experiencing frequent hypoglycemia with BG<70. - Reviewed long term cardiovascular and renal outcomes of uncontrolled blood sugar - Reviewed goal A1c, goal fasting, and goal 2 hour post prandial glucose - Reviewed dietary modifications including increasing protein intake to help stabilize BG - Recommend to decrease metformin  to 1000 mg daily - Recommend to continue Rybelsus  7 mg daily  - Patient denies personal or family history of multiple endocrine neoplasia type 2, medullary thyroid  cancer; personal history of pancreatitis or gallbladder disease. - Recommend to check fasting blood glucose daily and as needed. Previously reported he was not interested in a CGM. - Approved for Novo PAP for Rybelsus  - Appropriately treated with high intensity statin    Follow Up Plan: PCP on 10/28/23  Georga Killings, PharmD PGY-1 Pharmacy Resident

## 2023-09-28 ENCOUNTER — Other Ambulatory Visit (INDEPENDENT_AMBULATORY_CARE_PROVIDER_SITE_OTHER): Admitting: Pharmacist

## 2023-09-28 DIAGNOSIS — Z7984 Long term (current) use of oral hypoglycemic drugs: Secondary | ICD-10-CM

## 2023-09-28 DIAGNOSIS — E119 Type 2 diabetes mellitus without complications: Secondary | ICD-10-CM

## 2023-10-09 ENCOUNTER — Other Ambulatory Visit: Payer: Self-pay | Admitting: Family Medicine

## 2023-10-09 DIAGNOSIS — E785 Hyperlipidemia, unspecified: Secondary | ICD-10-CM

## 2023-10-28 ENCOUNTER — Encounter: Payer: Self-pay | Admitting: Family Medicine

## 2023-10-28 ENCOUNTER — Ambulatory Visit: Admitting: Family Medicine

## 2023-10-28 VITALS — BP 138/68 | HR 88 | Ht 68.5 in | Wt 178.0 lb

## 2023-10-28 DIAGNOSIS — Z87891 Personal history of nicotine dependence: Secondary | ICD-10-CM

## 2023-10-28 DIAGNOSIS — I152 Hypertension secondary to endocrine disorders: Secondary | ICD-10-CM

## 2023-10-28 DIAGNOSIS — E785 Hyperlipidemia, unspecified: Secondary | ICD-10-CM | POA: Diagnosis not present

## 2023-10-28 DIAGNOSIS — E1159 Type 2 diabetes mellitus with other circulatory complications: Secondary | ICD-10-CM | POA: Diagnosis not present

## 2023-10-28 DIAGNOSIS — E1149 Type 2 diabetes mellitus with other diabetic neurological complication: Secondary | ICD-10-CM

## 2023-10-28 DIAGNOSIS — E119 Type 2 diabetes mellitus without complications: Secondary | ICD-10-CM

## 2023-10-28 DIAGNOSIS — E782 Mixed hyperlipidemia: Secondary | ICD-10-CM | POA: Diagnosis not present

## 2023-10-28 LAB — LIPID PANEL

## 2023-10-28 LAB — BAYER DCA HB A1C WAIVED: HB A1C (BAYER DCA - WAIVED): 6.1 % — ABNORMAL HIGH (ref 4.8–5.6)

## 2023-10-28 MED ORDER — METFORMIN HCL 1000 MG PO TABS: 1000.0000 mg | ORAL_TABLET | Freq: Every day | ORAL | 3 refills | Status: DC

## 2023-10-28 MED ORDER — ATORVASTATIN CALCIUM 40 MG PO TABS: 40.0000 mg | ORAL_TABLET | Freq: Every day | ORAL | 3 refills | Status: AC

## 2023-10-28 NOTE — Progress Notes (Signed)
 BP 138/68   Pulse 88   Ht 5' 8.5 (1.74 m)   Wt 178 lb (80.7 kg)   SpO2 97%   BMI 26.67 kg/m    Subjective:   Patient ID: Philip [name], male    DOB: 1950-04-22, 73 y.o.   MRN: [id number]  HPI: Philip [name] is a 73 y.o. male presenting on 10/28/2023 for Medical Management of Chronic Issues and Diabetes   HPI Type 2 diabetes mellitus Patient comes in today for recheck of his diabetes. Patient has been currently taking metformin  and Rybelsus . Patient is currently on an ACE inhibitor/ARB. Patient has not seen an ophthalmologist this year. Patient denies any new issues with their feet. The symptom started onset as an adult hypertension and hyperlipidemia ARE RELATED TO DM   Hypertension Patient is currently on lisinopril , and their blood pressure today is 138/68. Patient denies any lightheadedness or dizziness. Patient denies headaches, blurred vision, chest pains, shortness of breath, or weakness. Denies any side effects from medication and is content with current medication.   Hyperlipidemia Patient is coming in for recheck of his hyperlipidemia. The patient is currently taking atorvastatin . They deny any issues with myalgias or history of liver damage from it. They deny any focal numbness or weakness or chest pain.   Relevant past medical, surgical, family and social history reviewed and updated as indicated. Interim medical history since our last visit reviewed. Allergies and medications reviewed and updated.  Review of Systems  Constitutional:  Negative for chills and fever.  Eyes:  Negative for visual disturbance.  Respiratory:  Negative for shortness of breath and wheezing.   Cardiovascular:  Negative for chest pain and leg swelling.  Skin:  Negative for rash.  Neurological:  Negative for dizziness and light-headedness.  All other systems reviewed and are negative.   Per HPI unless specifically indicated above   Allergies as of 10/28/2023       Reactions   Ativan   [lorazepam ] Other (See Comments)   Caused patient not to remember several days.   Blueberry [vaccinium Angustifolium] Swelling   Fish Oil    Glucosamine Forte [nutritional Supplements]    Weak, flu like symptoms   Shellfish Allergy    Ultram [tramadol Hcl] Hives        Medication List        Accurate as of October 28, 2023 11:17 AM. If you have any questions, ask your nurse or doctor.          aspirin  EC 81 MG tablet Take 81 mg by mouth in the morning and at bedtime.   atorvastatin  40 MG tablet Commonly known as: LIPITOR Take 1 tablet (40 mg total) by mouth daily.   cyclobenzaprine  10 MG tablet Commonly known as: FLEXERIL  Take 1 tablet (10 mg total) by mouth 3 (three) times daily as needed for muscle spasms.   lisinopril  10 MG tablet Commonly known as: ZESTRIL  Take 1 tablet (10 mg total) by mouth daily.   metFORMIN  1000 MG tablet Commonly known as: GLUCOPHAGE  Take 1 tablet (1,000 mg total) by mouth daily.   methylPREDNISolone  4 MG Tbpk tablet Commonly known as: MEDROL  DOSEPAK Follow the instruction on the box   Rybelsus  7 MG Tabs Generic drug: Semaglutide  Take 1 tablet (7 mg total) by mouth daily.         Objective:   BP 138/68   Pulse 88   Ht 5' 8.5 (1.74 m)   Wt 178 lb (80.7 kg)   SpO2 97%   BMI  26.67 kg/m   Wt Readings from Last 3 Encounters:  10/28/23 178 lb (80.7 kg)  07/22/23 186 lb (84.4 kg)  04/21/23 187 lb (84.8 kg)    Physical Exam Vitals and nursing note reviewed.  Constitutional:      General: He is not in acute distress.    Appearance: He is well-developed. He is not diaphoretic.  Eyes:     General: No scleral icterus.    Conjunctiva/sclera: Conjunctivae normal.  Neck:     Thyroid : No thyromegaly.  Cardiovascular:     Rate and Rhythm: Normal rate and regular rhythm.     Heart sounds: Normal heart sounds. No murmur heard. Pulmonary:     Effort: Pulmonary effort is normal. No respiratory distress.     Breath sounds: Normal  breath sounds. No wheezing.  Musculoskeletal:        General: No swelling. Normal range of motion.     Cervical back: Neck supple.  Lymphadenopathy:     Cervical: No cervical adenopathy.  Skin:    General: Skin is warm and dry.     Findings: No rash.  Neurological:     Mental Status: He is alert and oriented to person, place, and time.     Coordination: Coordination normal.  Psychiatric:        Behavior: Behavior normal.       Assessment & Plan:   Problem List Items Addressed This Visit       Cardiovascular and Mediastinum   Hypertension associated with type 2 diabetes mellitus (HCC) - Primary   Relevant Medications   atorvastatin  (LIPITOR) 40 MG tablet   metFORMIN  (GLUCOPHAGE ) 1000 MG tablet   Other Relevant Orders   Bayer DCA Hb A1c Waived   CBC with Differential/Platelet   CMP14+EGFR   Lipid panel     Endocrine   Diabetes (HCC)   Relevant Medications   atorvastatin  (LIPITOR) 40 MG tablet   metFORMIN  (GLUCOPHAGE ) 1000 MG tablet     Other   Hyperlipidemia (Chronic)   Relevant Medications   atorvastatin  (LIPITOR) 40 MG tablet   Other Visit Diagnoses       Stopped smoking with greater than 30 pack year history       Relevant Orders   Bayer DCA Hb A1c Waived   CBC with Differential/Platelet   CMP14+EGFR   Lipid panel       A1c 6.0, blood pressure looks good today.  No changes, seems to be doing well. Follow up plan: Return in about 3 months (around 01/28/2024), or if symptoms worsen or fail to improve, for Diabetes and hypertension and cholesterol.  Counseling provided for all of the vaccine components Orders Placed This Encounter  Procedures   Bayer DCA Hb A1c Waived   CBC with Differential/Platelet   CMP14+EGFR   Lipid panel    Fonda Levins, MD [facility] 10/28/2023, 11:17 AM

## 2023-10-29 LAB — CMP14+EGFR
ALT: 13 IU/L (ref 0–44)
AST: 21 IU/L (ref 0–40)
Albumin: 4.2 g/dL (ref 3.8–4.8)
Alkaline Phosphatase: 96 IU/L (ref 44–121)
BUN/Creatinine Ratio: 7 — AB (ref 10–24)
BUN: 6 mg/dL — AB (ref 8–27)
Bilirubin Total: 0.6 mg/dL (ref 0.0–1.2)
CO2: 18 mmol/L — AB (ref 20–29)
Calcium: 10.4 mg/dL — AB (ref 8.6–10.2)
Chloride: 107 mmol/L — AB (ref 96–106)
Creatinine, Ser: 0.87 mg/dL (ref 0.76–1.27)
Globulin, Total: 1.8 g/dL (ref 1.5–4.5)
Glucose: 125 mg/dL — AB (ref 70–99)
Potassium: 4.3 mmol/L (ref 3.5–5.2)
Sodium: 143 mmol/L (ref 134–144)
Total Protein: 6 g/dL (ref 6.0–8.5)
eGFR: 92 mL/min/1.73 (ref >59)

## 2023-10-29 LAB — CBC WITH DIFFERENTIAL/PLATELET
Basophils Absolute: 0.1 x10E3/uL (ref 0.0–0.2)
Basos: 1 %
EOS (ABSOLUTE): 0.3 x10E3/uL (ref 0.0–0.4)
Eos: 5 %
Hematocrit: 39.6 % (ref 37.5–51.0)
Hemoglobin: 13.1 g/dL (ref 13.0–17.7)
Immature Grans (Abs): 0 x10E3/uL (ref 0.0–0.1)
Immature Granulocytes: 0 %
Lymphocytes Absolute: 2.9 x10E3/uL (ref 0.7–3.1)
Lymphs: 39 %
MCH: 31.8 pg (ref 26.6–33.0)
MCHC: 33.1 g/dL (ref 31.5–35.7)
MCV: 96 fL (ref 79–97)
Monocytes Absolute: 0.8 x10E3/uL (ref 0.1–0.9)
Monocytes: 10 %
Neutrophils Absolute: 3.3 x10E3/uL (ref 1.4–7.0)
Neutrophils: 45 %
Platelets: 297 x10E3/uL (ref 150–450)
RBC: 4.12 x10E6/uL — ABNORMAL LOW (ref 4.14–5.80)
RDW: 12.8 % (ref 11.6–15.4)
WBC: 7.4 x10E3/uL (ref 3.4–10.8)

## 2023-10-29 LAB — LIPID PANEL
Cholesterol, Total: 121 mg/dL (ref 100–199)
HDL: 46 mg/dL (ref >39)
LDL CALC COMMENT:: 2.6 ratio (ref 0.0–5.0)
LDL Chol Calc (NIH): 59 mg/dL (ref 0–99)
Triglycerides: 83 mg/dL (ref 0–149)
VLDL Cholesterol Cal: 16 mg/dL (ref 5–40)

## 2023-11-04 ENCOUNTER — Ambulatory Visit: Payer: Self-pay | Admitting: Family Medicine

## 2023-11-14 ENCOUNTER — Telehealth: Payer: Self-pay

## 2024-01-03 ENCOUNTER — Other Ambulatory Visit: Payer: Self-pay | Admitting: Family Medicine

## 2024-01-03 DIAGNOSIS — E119 Type 2 diabetes mellitus without complications: Secondary | ICD-10-CM

## 2024-01-04 ENCOUNTER — Telehealth: Payer: Self-pay

## 2024-01-04 NOTE — Telephone Encounter (Signed)
 Copied from CRM #[id number]. Topic: Clinical - Medical Advice >> Jan 04, 2024 10:17 AM Gustabo D wrote: Pt would like a call from Mliss Griffin

## 2024-01-11 ENCOUNTER — Other Ambulatory Visit (INDEPENDENT_AMBULATORY_CARE_PROVIDER_SITE_OTHER)

## 2024-01-11 ENCOUNTER — Telehealth: Payer: Self-pay

## 2024-01-11 DIAGNOSIS — E119 Type 2 diabetes mellitus without complications: Secondary | ICD-10-CM

## 2024-01-11 DIAGNOSIS — Z7984 Long term (current) use of oral hypoglycemic drugs: Secondary | ICD-10-CM

## 2024-01-11 NOTE — Telephone Encounter (Signed)
 Copied from CRM #[id number]. Topic: General - Other >> Jan 11, 2024 12:17 PM Myrick T wrote: Reason for CRM: patient said he was returning call from [place]. Per CAL Mliss will call back around 2pm

## 2024-01-11 NOTE — Progress Notes (Signed)
 01/11/2024 Name: Philip [name] MRN: [id number] DOB: 1950-04-29  Chief Complaint  Patient presents with   Diabetes    Philip [name] is a 73 y.o. year old male who presented for a telephone visit.  I connected with  Philip [name] on 01/11/24 by telephone and verified that I am speaking with the correct person using two identifiers.  I discussed the limitations of evaluation and management by telemedicine. The patient expressed understanding and agreed to proceed.  Patient was located in her home and PharmD in PCP office during this visit.   They were referred to the pharmacist by their PCP for assistance in managing diabetes and medication access.    Subjective:  Patient reports changes in bowel habits since restarting Rybelsus .  He reports 2 weeks of constipation.  He stopped the Rybelsus  1 week ago and then his bowels went back to normal.  He continues to take metformin  once daily.  Care Team: Primary Care Provider: Dettinger, Philip LABOR, MD   Medication Access/Adherence  Current Pharmacy:  [facility] [address] [place] - [address] [place] [address] Phone: [phone] Fax: [phone]  Patient reports affordability concerns with their medications: Yes  Patient reports access/transportation concerns to their pharmacy: No  Patient reports adherence concerns with their medications:  No    Diabetes:  Current medications: Rybelsus , metformin  Medications tried in the past: canagliflozin , empagliflozin , glipizide , sitagliptin   Current glucose readings: FBG 128, 135, 120, 146 Uses traditional glucometer Very compliant with fingersticks   Patient denies hypoglycemic s/sx including dizziness, shakiness, sweating. Patient denies hyperglycemic symptoms including polyuria, polydipsia, polyphagia, nocturia, neuropathy, blurred vision.  Current meal patterns:  Discussed meal planning options and Plate method for healthy eating Avoid sugary drinks  and desserts Incorporate balanced protein, non starchy veggies, 1 serving of carbohydrate with each meal Increase water intake Increase physical activity as able  Current physical activity: working out twice daily   Current medication access support:  novo nordisk PAP for Rybelsus    Objective:  Lab Results  Component Value Date   HGBA1C 6.1 (H) 10/28/2023    Lab Results  Component Value Date   CREATININE 0.87 10/28/2023   BUN 6 (L) 10/28/2023   NA 143 10/28/2023   K 4.3 10/28/2023   CL 107 (H) 10/28/2023   CO2 18 (L) 10/28/2023    Lab Results  Component Value Date   CHOL 121 10/28/2023   HDL 46 10/28/2023   LDLCALC 59 10/28/2023   TRIG 83 10/28/2023   CHOLHDL 2.6 10/28/2023    Medications Reviewed Today     Reviewed by Philip [name], [facility] (Pharmacist) on 01/11/24 at 1400  Med List Status: <None>   Medication Order Taking? Sig Documenting Provider Last Dose Status Informant  aspirin  EC 81 MG tablet [id number]  Take 81 mg by mouth in the morning and at bedtime.  [provider]  Active Self  atorvastatin  (LIPITOR) 40 MG tablet [id number]  Take 1 tablet (40 mg total) by mouth daily. Philip [name], Philip LABOR, MD  Active   cyclobenzaprine  (FLEXERIL ) 10 MG tablet [id number]  Take 1 tablet (10 mg total) by mouth 3 (three) times daily as needed for muscle spasms. St Philip [name], Nena, NP  Active   lisinopril  (ZESTRIL ) 10 MG tablet [id number]  Take 1 tablet (10 mg total) by mouth daily. Philip [name], Philip LABOR, MD  Active   metFORMIN  (GLUCOPHAGE ) 1000 MG tablet [id number]  TAKE 1 TABLET BY MOUTH TWICE DAILY WITH MEALS Philip [name],  Philip LABOR, MD  Active   methylPREDNISolone  (MEDROL  DOSEPAK) 4 MG TBPK tablet [id number]  Follow the instruction on the box St Philip [name], Nena, NP  Active   Semaglutide  (RYBELSUS ) 7 MG TABS [id number]  Take 1 tablet (7 mg total) by mouth daily. Philip [name], Philip LABOR, MD  Active            Med Note Philip [name], Philip JONETTA Schaumann Sep 15, 2023  1:49 PM) Via  novo nordisk patient assistance program               Assessment/Plan:   Diabetes: - Currently controlled - Reviewed long term cardiovascular and renal outcomes of uncontrolled blood sugar - Reviewed goal A1c, goal fasting, and goal 2 hour post prandial glucose - Reviewed dietary modifications including FOLLOWING A HEART HEALTHY DIET/HEALTHY PLATE METHOD - Recommend to: HOLD Rybelsus   Increase metformin  to twice daily  - Recommend to check glucose daily (fasting) or if symptomatic   Follow Up Plan: 2 weeks   Philip [name], PharmD, BCACP, CPP Clinical Pharmacist, [facility]   30 min of patient care was provided to the patient during this visit time.  No charge visit

## 2024-01-11 NOTE — Telephone Encounter (Signed)
 Returned call to patient  No vm box set up

## 2024-01-13 NOTE — Telephone Encounter (Signed)
 Spoke with patient during scheduled appt time

## 2024-01-25 ENCOUNTER — Other Ambulatory Visit (INDEPENDENT_AMBULATORY_CARE_PROVIDER_SITE_OTHER)

## 2024-01-25 DIAGNOSIS — E119 Type 2 diabetes mellitus without complications: Secondary | ICD-10-CM

## 2024-01-25 DIAGNOSIS — Z7984 Long term (current) use of oral hypoglycemic drugs: Secondary | ICD-10-CM

## 2024-01-25 NOTE — Progress Notes (Signed)
 01/25/2024 Name: Philip [name] MRN: [id number] DOB: 23-Jun-1950  Chief Complaint  Patient presents with   Diabetes    Philip [name] is a 73 y.o. year old male who presented for a telephone visit.  I connected with  Philip [name] on 01/25/24 by telephone and verified that I am speaking with the correct person using two identifiers.  I discussed the limitations of evaluation and management by telemedicine. The patient expressed understanding and agreed to proceed.  Patient was located in her home and PharmD in PCP office during this visit.   They were referred to the pharmacist by their PCP for assistance in managing diabetes and medication access.    Subjective:  Patient has been off of Rybelsus  for a little over a month now.  He is feeling better overall and continues to go to the gym.  He does report some constipation, but has not had to use medication to help with this. He continues to take metformin  once daily.  Care Team: Primary Care Provider: Dettinger, Fonda LABOR, MD   Medication Access/Adherence  Current Pharmacy:  [facility] [address] [place] - [address] [place] [address] Phone: [phone] Fax: [phone]  Patient reports affordability concerns with their medications: Yes  Patient reports access/transportation concerns to their pharmacy: No  Patient reports adherence concerns with their medications:  No    Diabetes:  Current medications: metformin  Medications tried in the past: canagliflozin , empagliflozin , glipizide , sitagliptin , rybelsus  (updated allergy list)  Current glucose readings: FBG 120-150 Uses traditional glucometer Very compliant with fingersticks   Patient denies hypoglycemic s/sx including dizziness, shakiness, sweating. Patient denies hyperglycemic symptoms including polyuria, polydipsia, polyphagia, nocturia, neuropathy, blurred vision.  Current meal patterns:  Discussed meal planning options and Plate  method for healthy eating Avoid sugary drinks and desserts Incorporate balanced protein, non starchy veggies, 1 serving of carbohydrate with each meal Increase water intake Increase physical activity as able  Current physical activity: working out twice daily   Current medication access support:  novo nordisk PAP for Rybelsus    Objective:  Lab Results  Component Value Date   HGBA1C 6.1 (H) 10/28/2023    Lab Results  Component Value Date   CREATININE 0.87 10/28/2023   BUN 6 (L) 10/28/2023   NA 143 10/28/2023   K 4.3 10/28/2023   CL 107 (H) 10/28/2023   CO2 18 (L) 10/28/2023    Lab Results  Component Value Date   CHOL 121 10/28/2023   HDL 46 10/28/2023   LDLCALC 59 10/28/2023   TRIG 83 10/28/2023   CHOLHDL 2.6 10/28/2023    Medications Reviewed Today     Reviewed by Billee Mliss BIRCH, [facility] (Pharmacist) on 01/25/24 at 1026  Med List Status: <None>   Medication Order Taking? Sig Documenting Provider Last Dose Status Informant  aspirin  EC 81 MG tablet [id number]  Take 81 mg by mouth in the morning and at bedtime.  [provider]  Active Self  atorvastatin  (LIPITOR) 40 MG tablet [id number]  Take 1 tablet (40 mg total) by mouth daily. Dettinger, Fonda LABOR, MD  Active   cyclobenzaprine  (FLEXERIL ) 10 MG tablet [id number]  Take 1 tablet (10 mg total) by mouth 3 (three) times daily as needed for muscle spasms. St Morton Hummer, Nena, NP  Active   lisinopril  (ZESTRIL ) 10 MG tablet [id number]  Take 1 tablet (10 mg total) by mouth daily. Dettinger, Fonda LABOR, MD  Active   metFORMIN  (GLUCOPHAGE ) 1000 MG tablet [id number]  TAKE 1 TABLET BY MOUTH TWICE DAILY WITH MEALS Dettinger, Fonda LABOR, MD  Active    Patient not taking:   Discontinued 01/25/24 1024 (Change in therapy)            Med Note>> Billee Mliss BIRCH, [facility]   09/15/2023  1:49 PM GI INTOLERANCE               Assessment/Plan:   Diabetes: - Currently controlled ON 1 TABLET OF METFORMIN  - Reviewed long term  cardiovascular and renal outcomes of uncontrolled blood sugar - Reviewed goal A1c, goal fasting, and goal 2 hour post prandial glucose - Reviewed dietary modifications including FOLLOWING A HEART HEALTHY DIET/HEALTHY PLATE METHOD - Recommend to: Continue metformin  daily unless BG begin to rise. Patient knows he can increase to 1 tablet twice daily with meals - Recommend to check glucose daily (fasting) or if symptomatic -Filling atorva and lisinopril ; controlled   Follow Up Plan: PharmD as needed; PCP f.u 02/05/2024   Mliss Tarry Billee, PharmD, BCACP, CPP Clinical Pharmacist, [facility]   30 min of patient care was provided to the patient during this visit time.  No charge visit

## 2024-02-03 ENCOUNTER — Encounter: Payer: Self-pay | Admitting: Family Medicine

## 2024-02-03 ENCOUNTER — Ambulatory Visit: Admitting: Family Medicine

## 2024-02-03 VITALS — BP 154/76 | HR 88 | Ht 68.5 in | Wt 186.0 lb

## 2024-02-03 DIAGNOSIS — E1149 Type 2 diabetes mellitus with other diabetic neurological complication: Secondary | ICD-10-CM | POA: Diagnosis not present

## 2024-02-03 DIAGNOSIS — I152 Hypertension secondary to endocrine disorders: Secondary | ICD-10-CM

## 2024-02-03 DIAGNOSIS — E782 Mixed hyperlipidemia: Secondary | ICD-10-CM

## 2024-02-03 DIAGNOSIS — E1159 Type 2 diabetes mellitus with other circulatory complications: Secondary | ICD-10-CM | POA: Diagnosis not present

## 2024-02-03 DIAGNOSIS — K5903 Drug induced constipation: Secondary | ICD-10-CM | POA: Diagnosis not present

## 2024-02-03 DIAGNOSIS — Z7984 Long term (current) use of oral hypoglycemic drugs: Secondary | ICD-10-CM

## 2024-02-03 LAB — BAYER DCA HB A1C WAIVED: HB A1C (BAYER DCA - WAIVED): 6.1 % — ABNORMAL HIGH (ref 4.8–5.6)

## 2024-02-03 NOTE — Progress Notes (Signed)
 BP (!) 154/76 Comment: Taken after going to lab  Pulse 88   Ht 5' 8.5 (1.74 m)   Wt 186 lb (84.4 kg)   SpO2 98%   BMI 27.87 kg/m    Subjective:   Patient ID: Philip [name], male    DOB: May 12, 1950, 73 y.o.   MRN: [id number]  HPI: Philip [name] is a 73 y.o. male presenting on 02/03/2024 for Medical Management of Chronic Issues, Hypertension, Hyperlipidemia, and Diabetes   Discussed the use of AI scribe software for clinical note transcription with the patient, who gave verbal consent to proceed.  History of Present Illness   [name] is a 73 year old male with diabetes who presents for a recheck of blood sugar levels.  Hyperglycemia management - Diabetes mellitus with recent discontinuation of Rybelsus  on January 03, 2024, due to adverse effects - Currently taking metformin , one dose daily, without dose escalation despite prior recommendations - Blood glucose levels mostly stable since stopping Rybelsus , with occasional unexplained spikes - Anticipates hemoglobin A1c of approximately 6.2% - Recent blood glucose readings include 120, 102, and 111 mg/dL  Constipation - Constipation occurred every two weeks while on Rybelsus , prompting discontinuation of the medication - Dietary modifications, including increased wheat intake, did not improve symptoms - Concern for fecal impaction prior to stopping Rybelsus , described as 'feels like a cricket ball' - Bowel movement occurred this morning - No use of laxatives to date; seeking guidance for management  Cutaneous trauma and skin irritation - Sustains abrasions from ambulation in [place] and from participation in fight club - Manages wounds with heavy-duty bandages and a rash guard - Uses unscented moisturizer sample from [employer] for skin irritation  Physical activity - 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 Interim medical history since our last visit reviewed. Allergies and medications reviewed and updated.  Review of Systems  Constitutional:  Negative for chills and fever.  Eyes:  Negative for visual disturbance.  Respiratory:  Negative for shortness of breath and wheezing.   Cardiovascular:  Negative for chest pain and leg swelling.  Musculoskeletal:  Negative for back pain and gait problem.  Skin:  Negative for rash.  Neurological:  Negative for dizziness and light-headedness.  All other systems reviewed and are negative.   Per HPI unless specifically indicated above   Allergies as of 02/03/2024       Reactions   Ativan  [lorazepam ] Other (See Comments)   Caused patient not to remember several days.   Blueberry [vaccinium Angustifolium] Swelling   Fish Oil    Glucosamine Forte [nutritional Supplements]    Weak, flu like symptoms   Semaglutide  Other (See Comments)   GI INTOLERANCE    Shellfish Allergy    Ultram [tramadol Hcl] Hives        Medication List        Accurate as of February 03, 2024 10:45 AM. If you have any questions, ask your nurse or doctor.          aspirin  EC 81 MG tablet Take 81 mg by mouth in the morning and at bedtime.   atorvastatin  40 MG tablet Commonly known as: LIPITOR Take 1 tablet (40 mg total) by mouth daily.   cyclobenzaprine  10  MG tablet Commonly known as: FLEXERIL  Take 1 tablet (10 mg total) by mouth 3 (three) times daily as needed for muscle spasms.   lisinopril  10 MG tablet Commonly known as: ZESTRIL  Take 1 tablet (10 mg total) by mouth daily.   metFORMIN  1000 MG tablet Commonly known as: GLUCOPHAGE  TAKE 1 TABLET BY MOUTH TWICE DAILY WITH MEALS         Objective:   BP (!) 154/76 Comment: Taken after going to lab  Pulse 88   Ht 5' 8.5 (1.74 m)   Wt 186 lb (84.4 kg)   SpO2 98%    BMI 27.87 kg/m   Wt Readings from Last 3 Encounters:  02/03/24 186 lb (84.4 kg)  10/28/23 178 lb (80.7 kg)  07/22/23 186 lb (84.4 kg)    Physical Exam Vitals and nursing note reviewed.  Constitutional:      Appearance: Normal appearance.  Neurological:     Mental Status: He is alert.    Physical Exam   CHEST: Lungs clear to auscultation bilaterally. CARDIOVASCULAR: Heart regular rate and rhythm, no murmurs.         Assessment & Plan:   Problem List Items Addressed This Visit       Cardiovascular and Mediastinum   Hypertension associated with type 2 diabetes mellitus (HCC)     Endocrine   Diabetes (HCC) - Primary   Relevant Orders   Bayer DCA Hb A1c Waived   Vitamin B12     Other   Hyperlipidemia (Chronic)   Other Visit Diagnoses       Drug-induced constipation              Constipation Chronic constipation exacerbated by Rybelsus , leading to discontinuation. Slight improvement since discontinuation, but symptoms persist. - Initiate Miralax  daily until bowel movements are regular. - Increase fluid intake. - Reassess symptoms after 5-6 days of Miralax  use.  Type 2 diabetes mellitus Type 2 diabetes mellitus with recent discontinuation of Rybelsus  due to constipation. Blood sugar levels around 120 mg/dL, anticipated J8r of 3.7%. Currently on metformin  once daily instead of the recommended doubled dose. - Continue current metformin  regimen. - Review A1c results when available.  A1c is 6.1, looks, no changes  Skin tears and abrasions Skin tears and abrasions on the arm, likely due to physical activity and minor trauma. Area appears raw and irritated but not infected. - Apply hydrocortisone cream to affected area twice daily. - Apply moisturizer liberally to affected area twice daily. - Consider using Vaseline at night for additional moisture if needed.          Follow up plan: Return in about 3 months (around 05/05/2024), or if symptoms worsen or fail to  improve, for Diabetes recheck.  Counseling provided for all of the vaccine components Orders Placed This Encounter  Procedures   Bayer DCA Hb A1c Waived   Vitamin B12    Fonda Levins, MD [facility] 02/03/2024, 10:45 AM

## 2024-02-04 LAB — VITAMIN B12: Vitamin B-12: 157 pg/mL — ABNORMAL LOW (ref 232–1245)

## 2024-02-08 ENCOUNTER — Ambulatory Visit: Payer: Self-pay | Admitting: Family Medicine

## 2024-02-08 DIAGNOSIS — E538 Deficiency of other specified B group vitamins: Secondary | ICD-10-CM

## 2024-02-08 MED ORDER — VITAMIN B-12 1000 MCG PO TABS: 1000.0000 ug | ORAL_TABLET | Freq: Every day | ORAL | 3 refills | Status: AC

## 2024-03-07 ENCOUNTER — Telehealth: Payer: Self-pay

## 2024-03-30 ENCOUNTER — Other Ambulatory Visit: Payer: Self-pay | Admitting: Family Medicine

## 2024-03-30 DIAGNOSIS — E119 Type 2 diabetes mellitus without complications: Secondary | ICD-10-CM

## 2024-05-01 ENCOUNTER — Telehealth: Payer: Self-pay | Admitting: Family Medicine

## 2024-05-01 NOTE — Telephone Encounter (Signed)
 Copied from CRM #[id number]. Topic: Medicare AWV >> Apr 02, 2024  9:28 AM Corean RAMAN wrote: This CRM was created to track scheduling of the patient's Medicare Annual Wellness Visit. If the patient is returning a call, please schedule an AWV-Subsequent on the generic ANNUAL WELLNESS VISIT schedule or resource for the patient's primary care office. >> May 01, 2024  9:31 AM Miquel SAILOR wrote: PT needs call back on why he need telephone call on 01/20 for AWV and office visit on 01/22. He just needs clarification on  why he stated. [phone]

## 2024-05-08 ENCOUNTER — Ambulatory Visit

## 2024-05-08 DIAGNOSIS — Z0000 Encounter for general adult medical examination without abnormal findings: Secondary | ICD-10-CM | POA: Diagnosis not present

## 2024-05-08 NOTE — Patient Instructions (Signed)
 Mr. Deeley,  Thank you for taking the time for your Medicare Wellness Visit. I appreciate your continued commitment to your health goals. Please review the care plan we discussed, and feel free to reach out if I can assist you further.  Please note that Annual Wellness Visits do not include a physical exam. Some assessments may be limited, especially if the visit was conducted virtually. If needed, we may recommend an in-person follow-up with your provider.  Ongoing Care Seeing your primary care provider every 3 to 6 months helps us  monitor your health and provide consistent, personalized care.   Referrals If a referral was made during today's visit and you haven't received any updates within two weeks, please contact the referred provider directly to check on the status.  Recommended Screenings:  Health Maintenance  Topic Date Due   Eye exam for diabetics  03/09/2024   Complete foot exam   04/21/2024   Flu Shot  07/17/2024*   DTaP/Tdap/Td vaccine (1 - Tdap) 07/21/2024*   Pneumococcal Vaccine for age over 64 (1 of 2 - PCV) 07/21/2024*   Hepatitis C Screening  07/21/2024*   Zoster (Shingles) Vaccine (1 of 2) 07/21/2024*   Screening for Lung Cancer  02/02/2025*   Kidney health urinalysis for diabetes  07/21/2024   Hemoglobin A1C  08/03/2024   Yearly kidney function blood test for diabetes  10/27/2024   Medicare Annual Wellness Visit  05/08/2025   Meningitis B Vaccine  Aged Out   Colon Cancer Screening  Discontinued   COVID-19 Vaccine  Discontinued  *Topic was postponed. The date shown is not the original due date.       05/08/2024    1:21 PM  Advanced Directives  Does Patient Have a Medical Advance Directive? No  Would patient like information on creating a medical advance directive? Yes (MAU/Ambulatory/Procedural Areas - Information given)   Information on Advanced Care Planning can be found at [place]  Secretary of [facility] Advance Health Care Directives Advance Health Care  Directives ([email or web address]   Vision: Annual vision screenings are recommended for early detection of glaucoma, cataracts, and diabetic retinopathy. These exams can also reveal signs of chronic conditions such as diabetes and high blood pressure.  Dental: Annual dental screenings help detect early signs of oral cancer, gum disease, and other conditions linked to overall health, including heart disease and diabetes.  Please see the attached documents for additional preventive care recommendations.

## 2024-05-08 NOTE — Progress Notes (Signed)
 "  Chief Complaint  Patient presents with   Medicare Wellness     Subjective:   Philip [name] is a 74 y.o. male who presents for a Medicare Annual Wellness Visit.  Visit info / Clinical Intake: Medicare Wellness Visit Type:: Subsequent Annual Wellness Visit Persons participating in visit and providing information:: patient Medicare Wellness Visit Mode:: Telephone If telephone:: video declined Since this visit was completed virtually, some vitals may be partially provided or unavailable. Missing vitals are due to the limitations of the virtual format.: Unable to obtain vitals - no equipment If Telephone or Video please confirm:: I connected with patient using audio/video enable telemedicine. I verified patient identity with two identifiers, discussed telehealth limitations, and patient agreed to proceed. Patient Location:: home Provider Location:: office Interpreter Needed?: No Pre-visit prep was completed: yes AWV questionnaire completed by patient prior to visit?: no Living arrangements:: (!) lives alone Patient's Overall Health Status Rating: very good Typical amount of pain: some Does pain affect daily life?: no Are you currently prescribed opioids?: no  Dietary Habits and Nutritional Risks How many meals a day?: 3 Eats fruit and vegetables daily?: yes Most meals are obtained by: preparing own meals; eating out In the last 2 weeks, have you had any of the following?: none Diabetic:: (!) yes Any non-healing wounds?: no How often do you check your BS?: continuous glucose monitor Would you like to be referred to a Nutritionist or for Diabetic Management? : no  Functional Status Activities of Daily Living (to include ambulation/medication): Independent Ambulation: Independent Medication Administration: Independent Home Management (perform basic housework or laundry): Independent Manage your own finances?: yes Primary transportation is: driving Concerns about vision?: no  *vision screening is required for WTM* Concerns about hearing?: no  Fall Screening Falls in the past year?: 0 Number of falls in past year: 0 Was there an injury with Fall?: 0 Fall Risk Category Calculator: 0 Patient Fall Risk Level: Low Fall Risk  Fall Risk Patient at Risk for Falls Due to: No Fall Risks Fall risk Follow up: Falls evaluation completed; Education provided; Falls prevention discussed  Home and Transportation Safety: All rugs have non-skid backing?: N/A, no rugs All stairs or steps have railings?: yes Grab bars in the bathtub or shower?: yes Have non-skid surface in bathtub or shower?: yes Good home lighting?: yes Regular seat belt use?: yes Hospital stays in the last year:: no  Cognitive Assessment Difficulty concentrating, remembering, or making decisions? : no Will 6CIT or Mini Cog be Completed: no 6CIT or Mini Cog Declined: patient alert, oriented, able to answer questions appropriately and recall recent events  Advance Directives (For Healthcare) Does Patient Have a Medical Advance Directive?: No Would patient like information on creating a medical advance directive?: Yes (MAU/Ambulatory/Procedural Areas - Information given)  Reviewed/Updated  Reviewed/Updated: Reviewed All (Medical, Surgical, Family, Medications, Allergies, Care Teams, Patient Goals)    Allergies (verified) Ativan  [lorazepam ], Blueberry [vaccinium angustifolium], Fish oil, Glucosamine forte [nutritional supplements], Semaglutide , Shellfish allergy, and Ultram [tramadol hcl]   Current Medications (verified) Outpatient Encounter Medications as of 05/08/2024  Medication Sig   aspirin  EC 81 MG tablet Take 81 mg by mouth in the morning and at bedtime.    atorvastatin  (LIPITOR) 40 MG tablet Take 1 tablet (40 mg total) by mouth daily.   cyanocobalamin  (VITAMIN B12) 1000 MCG tablet Take 1 tablet (1,000 mcg total) by mouth daily.   cyclobenzaprine  (FLEXERIL ) 10 MG tablet Take 1 tablet (10 mg  total) by mouth 3 (three) times daily  as needed for muscle spasms.   lisinopril  (ZESTRIL ) 10 MG tablet Take 1 tablet (10 mg total) by mouth daily.   metFORMIN  (GLUCOPHAGE ) 1000 MG tablet TAKE 1 TABLET BY MOUTH TWICE DAILY WITH MEALS   No facility-administered encounter medications on file as of 05/08/2024.    History: Past Medical History:  Diagnosis Date   Chest pain    Diabetes mellitus without complication (HCC)    Hyperlipidemia    Hypertension    PTSD (post-traumatic stress disorder)    Past Surgical History:  Procedure Laterality Date   BACK SURGERY  1999   hernitated and ruptured discs   LUMBAR LAMINECTOMY/DECOMPRESSION MICRODISCECTOMY Right 08/01/2014   Procedure: Right L/4-5 Extraforaminal Diskectomy;  Surgeon: Alm GORMAN Molt, MD;  Location: [facility];  Service: Neurosurgery;  Laterality: Right;  Right L/4-5 Extraforaminal Diskectomy   TONSILLECTOMY     TONSILLECTOMY     Family History  Adopted: Yes   Social History   Occupational History   Occupation: Retired  Tobacco Use   Smoking status: Some Days    Current packs/day: 0.50    Average packs/day: 0.5 packs/day for 53.0 years (26.5 ttl pk-yrs)    Types: Cigarettes   Smokeless tobacco: Never  Vaping Use   Vaping status: Never Used  Substance and Sexual Activity   Alcohol use: No   Drug use: No   Sexual activity: Not Currently   Tobacco Counseling Ready to quit: Not Answered Counseling given: Not Answered  SDOH Screenings   Food Insecurity: No Food Insecurity (05/08/2024)  Housing: Low Risk (05/08/2024)  Transportation Needs: No Transportation Needs (05/08/2024)  Utilities: Not At Risk (05/08/2024)  Alcohol Screen: Low Risk (04/21/2023)  Depression (PHQ2-9): Low Risk (05/08/2024)  Financial Resource Strain: Low Risk (04/21/2023)  Physical Activity: Sufficiently Active (05/08/2024)  Social Connections: Moderately Isolated (05/08/2024)  Stress: No Stress Concern Present (05/08/2024)  Tobacco Use: High Risk  (05/08/2024)  Health Literacy: Adequate Health Literacy (05/08/2024)   See flowsheets for full screening details  Depression Screen Depression Screening Exception Documentation Depression Screening Exception:: Patient refusal  PHQ 2 & 9 Depression Scale- Over the past 2 weeks, how often have you been bothered by any of the following problems? Little interest or pleasure in doing things: 0 Feeling down, depressed, or hopeless (PHQ Adolescent also includes...irritable): 0 PHQ-2 Total Score: 0 Trouble falling or staying asleep, or sleeping too much: 1 Feeling tired or having little energy: 2 Poor appetite or overeating (PHQ Adolescent also includes...weight loss): 2 Feeling bad about yourself - or that you are a failure or have let yourself or your family down: 2 Trouble concentrating on things, such as reading the newspaper or watching television (PHQ Adolescent also includes...like school work): 1 Moving or speaking so slowly that other people could have noticed. Or the opposite - being so fidgety or restless that you have been moving around a lot more than usual: 0 Thoughts that you would be better off dead, or of hurting yourself in some way: 0 PHQ-9 Total Score: 10 If you checked off any problems, how difficult have these problems made it for you to do your work, take care of things at home, or get along with other people?: Not difficult at all  Depression Treatment Depression Interventions/Treatment : Medication; Currently on Treatment     Goals Addressed             This Visit's Progress    COMPLETED: Patient Stated       04/01/2020 AWV Goal: Fall  Prevention  Over the next year, patient will decrease their risk for falls by: Using assistive devices, such as a cane or walker, as needed Identifying fall risks within their home and correcting them by: Removing throw rugs Adding handrails to stairs or ramps Removing clutter and keeping a clear pathway throughout the  home Increasing light, especially at night Adding shower handles/bars Raising toilet seat Identifying potential personal risk factors for falls: Medication side effects Incontinence/urgency Vestibular dysfunction Hearing loss Musculoskeletal disorders Neurological disorders Orthostatic hypotension       Remain active and independent   On track            Objective:    Today's Vitals   There is no height or weight on file to calculate BMI.  Hearing/Vision screen No results found. Immunizations and Health Maintenance Health Maintenance  Topic Date Due   OPHTHALMOLOGY EXAM  03/09/2024   FOOT EXAM  04/21/2024   Influenza Vaccine  07/17/2024 (Originally 11/18/2023)   DTaP/Tdap/Td (1 - Tdap) 07/21/2024 (Originally [birth year]10/1970)   Pneumococcal Vaccine: 50+ Years (1 of 2 - PCV) 07/21/2024 (Originally 06/10/1970)   Hepatitis C Screening  07/21/2024 (Originally 06/10/1969)   Zoster Vaccines- Shingrix (1 of 2) 07/21/2024 (Originally 06/10/2001)   Lung Cancer Screening  02/02/2025 (Originally 09/23/2011)   Diabetic kidney evaluation - Urine ACR  07/21/2024   HEMOGLOBIN A1C  08/03/2024   Diabetic kidney evaluation - eGFR measurement  10/27/2024   Medicare Annual Wellness (AWV)  05/08/2025   Meningococcal B Vaccine  Aged Out   Colonoscopy  Discontinued   COVID-19 Vaccine  Discontinued        Assessment/Plan:  This is a routine wellness examination for Philip [name].  Patient Care Team: Dettinger, Fonda LABOR, MD as PCP - General (Family Medicine) Mallipeddi, Diannah SQUIBB, MD as PCP - Cardiology (Cardiology) Billee Mliss BIRCH, RPH-CPP (Pharmacist)  I have personally reviewed and noted the following in the patients chart:   Medical and social history Use of alcohol, tobacco or illicit drugs  Current medications and supplements including opioid prescriptions. Functional ability and status Nutritional status Physical activity Advanced directives List of other physicians Hospitalizations,  surgeries, and ER visits in previous 12 months Vitals Screenings to include cognitive, depression, and falls Referrals and appointments  No orders of the defined types were placed in this encounter.  In addition, I have reviewed and discussed with patient certain preventive protocols, quality metrics, and best practice recommendations. A written personalized care plan for preventive services as well as general preventive health recommendations were provided to patient.   Lavelle Charmaine Browner, LPN   [id number]   Return in 1 year (on 05/08/2025).  After Visit Summary: (Pick Up) Due to this being a telephonic visit, with patients personalized plan was offered to patient and patient has requested to Pick up at office.  Nurse Notes: Patient advised to keep follow-up appointment with PCP (05/10/24)  "

## 2024-05-10 ENCOUNTER — Encounter: Payer: Self-pay | Admitting: Family Medicine

## 2024-05-10 ENCOUNTER — Ambulatory Visit: Payer: Self-pay | Admitting: Family Medicine

## 2024-05-10 VITALS — BP 159/97 | HR 104 | Ht 68.5 in | Wt 188.0 lb

## 2024-05-10 DIAGNOSIS — E1149 Type 2 diabetes mellitus with other diabetic neurological complication: Secondary | ICD-10-CM | POA: Diagnosis not present

## 2024-05-10 DIAGNOSIS — E785 Hyperlipidemia, unspecified: Secondary | ICD-10-CM | POA: Diagnosis not present

## 2024-05-10 DIAGNOSIS — E119 Type 2 diabetes mellitus without complications: Secondary | ICD-10-CM

## 2024-05-10 DIAGNOSIS — E291 Testicular hypofunction: Secondary | ICD-10-CM

## 2024-05-10 DIAGNOSIS — I10 Essential (primary) hypertension: Secondary | ICD-10-CM | POA: Diagnosis not present

## 2024-05-10 DIAGNOSIS — Z7984 Long term (current) use of oral hypoglycemic drugs: Secondary | ICD-10-CM | POA: Diagnosis not present

## 2024-05-10 DIAGNOSIS — I152 Hypertension secondary to endocrine disorders: Secondary | ICD-10-CM

## 2024-05-10 DIAGNOSIS — E1159 Type 2 diabetes mellitus with other circulatory complications: Secondary | ICD-10-CM

## 2024-05-10 LAB — BAYER DCA HB A1C WAIVED: HB A1C (BAYER DCA - WAIVED): 7 % — ABNORMAL HIGH (ref 4.8–5.6)

## 2024-05-10 LAB — LIPID PANEL

## 2024-05-10 MED ORDER — METFORMIN HCL 1000 MG PO TABS: 1000.0000 mg | ORAL_TABLET | Freq: Two times a day (BID) | ORAL | 3 refills | Status: AC

## 2024-05-10 MED ORDER — LISINOPRIL 10 MG PO TABS: 10.0000 mg | ORAL_TABLET | Freq: Every day | ORAL | 3 refills | Status: AC

## 2024-05-10 NOTE — Progress Notes (Signed)
 "  BP (!) 159/97   Pulse (!) 104   Ht 5' 8.5 (1.74 m)   Wt 188 lb (85.3 kg)   SpO2 98%   BMI 28.17 kg/m    Subjective:   Patient ID: Philip [name], male    DOB: January 02, 1951, 74 y.o.   MRN: [id number]  HPI: Philip [name] is a 74 y.o. male presenting on 05/10/2024 for Medical Management of Chronic Issues, Diabetes, and Hypertension   Discussed the use of AI scribe software for clinical note transcription with the patient, who gave verbal consent to proceed.  History of Present Illness   Philip [name] is a 74 year old male with diabetes and hypertension who presents for a recheck of his diabetes and blood pressure.  Hypertension - Blood pressure at home typically around 140/80 mmHg - Takes antihypertensive medication daily  Diabetes mellitus - Blood glucose levels slightly elevated - Recent blood sugar reading of 7.0 - Occasional higher readings up to 200 mg/dL, which normalize after a few hours - Takes metformin , one tablet in the morning and one in the evening  Physical activity limitation - Exercise routine disrupted due to club relocation to a facility without heating - No exercise in the past week due to cold conditions - Believes lack of exercise may contribute to elevated blood sugar levels  Dietary habits - Since the beginning of the year, consumed only one piece of cheesecake and a small amount of angel food cake - Mindful of sugar intake  Musculoskeletal symptoms - Recovery time from physical activity has lengthened, especially in cold weather - Experiences cramping and stiffness in the cold - Avoids excessive use of Advil - Uses a heating pad for symptom relief          Relevant past medical, surgical, family and social history reviewed and updated as indicated. Interim medical history since our last visit reviewed. Allergies and medications reviewed and updated.  Review of Systems  Constitutional:  Negative for chills and fever.  Eyes:  Negative for  visual disturbance.  Respiratory:  Negative for shortness of breath and wheezing.   Cardiovascular:  Negative for chest pain and leg swelling.  Musculoskeletal:  Positive for arthralgias. Negative for back pain and gait problem.  Skin:  Negative for rash.  All other systems reviewed and are negative.   Per HPI unless specifically indicated above   Allergies as of 05/10/2024       Reactions   Ativan  [lorazepam ] Other (See Comments)   Caused patient not to remember several days.   Blueberry [vaccinium Angustifolium] Swelling   Fish Oil    Glucosamine Forte [nutritional Supplements]    Weak, flu like symptoms   Semaglutide  Other (See Comments)   GI INTOLERANCE    Shellfish Allergy    Ultram [tramadol Hcl] Hives        Medication List        Accurate as of May 10, 2024 10:38 AM. If you have any questions, ask your nurse or doctor.          aspirin  EC 81 MG tablet Take 81 mg by mouth in the morning and at bedtime.   atorvastatin  40 MG tablet Commonly known as: LIPITOR Take 1 tablet (40 mg total) by mouth daily.   cyanocobalamin  1000 MCG tablet Commonly known as: VITAMIN B12 Take 1 tablet (1,000 mcg total) by mouth daily.   cyclobenzaprine  10 MG tablet Commonly known as: FLEXERIL  Take 1 tablet (10 mg total) by mouth 3 (three) times  daily as needed for muscle spasms.   lisinopril  10 MG tablet Commonly known as: ZESTRIL  Take 1 tablet (10 mg total) by mouth daily.   metFORMIN  1000 MG tablet Commonly known as: GLUCOPHAGE  Take 1 tablet (1,000 mg total) by mouth 2 (two) times daily with a meal.         Objective:   BP (!) 159/97   Pulse (!) 104   Ht 5' 8.5 (1.74 m)   Wt 188 lb (85.3 kg)   SpO2 98%   BMI 28.17 kg/m   Wt Readings from Last 3 Encounters:  05/10/24 188 lb (85.3 kg)  02/03/24 186 lb (84.4 kg)  10/28/23 178 lb (80.7 kg)    Physical Exam Physical Exam   VITALS: BP- 159/97 CHEST: Lungs clear to auscultation. CARDIOVASCULAR: Heart  regular rate and rhythm, no murmurs. EXTREMITIES: No edema, pulses normal.         Assessment & Plan:   Problem List Items Addressed This Visit       Cardiovascular and Mediastinum   Hypertension associated with type 2 diabetes mellitus (HCC) - Primary   Relevant Medications   lisinopril  (ZESTRIL ) 10 MG tablet   metFORMIN  (GLUCOPHAGE ) 1000 MG tablet   Other Relevant Orders   CBC with Differential/Platelet   CMP14+EGFR   Lipid panel   PSA, total and free   TSH   Bayer DCA Hb A1c Waived   Testosterone ,Free and Total     Endocrine   Diabetes (HCC)   Relevant Medications   lisinopril  (ZESTRIL ) 10 MG tablet   metFORMIN  (GLUCOPHAGE ) 1000 MG tablet     Other   Hyperlipidemia (Chronic)   Relevant Medications   lisinopril  (ZESTRIL ) 10 MG tablet   Other Relevant Orders   CBC with Differential/Platelet   CMP14+EGFR   Lipid panel   PSA, total and free   TSH   Bayer DCA Hb A1c Waived   Testosterone ,Free and Total   Other Visit Diagnoses       Testosterone  deficiency in male       Relevant Orders   Testosterone ,Free and Total     Essential hypertension       Relevant Medications   lisinopril  (ZESTRIL ) 10 MG tablet          Type 2 diabetes mellitus Slightly elevated blood glucose, HbA1c at 7.0%. Decreased exercise and dietary indulgences may contribute. Metformin  management appropriate. - Continue metformin , one tablet in the morning and one in the evening. - Monitor blood glucose levels regularly. - Consider purchasing a new glucose meter for more accurate readings. - Encouraged resumption of regular exercise as weather permits.  Essential hypertension Blood pressure elevated at 159/97 mmHg, higher than usual home readings. Current medication regimen adhered to. - Continue current antihypertensive medication regimen. - Monitor blood pressure at home regularly. - Report any consistent elevation in blood pressure for potential medication adjustment.           Follow up plan: Return in about 3 months (around 08/08/2024), or if symptoms worsen or fail to improve, for Diabetes recheck.  Counseling provided for all of the vaccine components Orders Placed This Encounter  Procedures   CBC with Differential/Platelet   CMP14+EGFR   Lipid panel   PSA, total and free   TSH   Bayer DCA Hb A1c Waived   Testosterone ,Free and Total    Fonda Levins, MD [facility] 05/10/2024, 10:38 AM     "

## 2024-05-12 LAB — CBC WITH DIFFERENTIAL/PLATELET
Basophils Absolute: 0.1 10*3/uL (ref 0.0–0.2)
Basos: 1 %
EOS (ABSOLUTE): 0.3 10*3/uL (ref 0.0–0.4)
Eos: 3 %
Hematocrit: 40.9 % (ref 37.5–51.0)
Hemoglobin: 13.6 g/dL (ref 13.0–17.7)
Immature Grans (Abs): 0 10*3/uL (ref 0.0–0.1)
Immature Granulocytes: 0 %
Lymphocytes Absolute: 3.1 10*3/uL (ref 0.7–3.1)
Lymphs: 34 %
MCH: 31.2 pg (ref 26.6–33.0)
MCHC: 33.3 g/dL (ref 31.5–35.7)
MCV: 94 fL (ref 79–97)
Monocytes Absolute: 0.8 10*3/uL (ref 0.1–0.9)
Monocytes: 9 %
Neutrophils Absolute: 4.8 10*3/uL (ref 1.4–7.0)
Neutrophils: 53 %
Platelets: 350 10*3/uL (ref 150–450)
RBC: 4.36 x10E6/uL (ref 4.14–5.80)
RDW: 13.3 % (ref 11.6–15.4)
WBC: 9 10*3/uL (ref 3.4–10.8)

## 2024-05-12 LAB — CMP14+EGFR
ALT: 11 [IU]/L (ref 0–44)
AST: 19 [IU]/L (ref 0–40)
Albumin: 4.3 g/dL (ref 3.8–4.8)
Alkaline Phosphatase: 98 [IU]/L (ref 47–123)
BUN/Creatinine Ratio: 12 (ref 10–24)
BUN: 11 mg/dL (ref 8–27)
Bilirubin Total: 0.5 mg/dL (ref 0.0–1.2)
CO2: 22 mmol/L (ref 20–29)
Calcium: 11.3 mg/dL — ABNORMAL HIGH (ref 8.6–10.2)
Chloride: 103 mmol/L (ref 96–106)
Creatinine, Ser: 0.94 mg/dL (ref 0.76–1.27)
Globulin, Total: 2.2 g/dL (ref 1.5–4.5)
Glucose: 156 mg/dL — ABNORMAL HIGH (ref 70–99)
Potassium: 4.8 mmol/L (ref 3.5–5.2)
Sodium: 137 mmol/L (ref 134–144)
Total Protein: 6.5 g/dL (ref 6.0–8.5)
eGFR: 86 mL/min/{1.73_m2}

## 2024-05-12 LAB — LIPID PANEL
Chol/HDL Ratio: 2.9 ratio (ref 0.0–5.0)
Cholesterol, Total: 152 mg/dL (ref 100–199)
HDL: 52 mg/dL
LDL Chol Calc (NIH): 76 mg/dL (ref 0–99)
Triglycerides: 140 mg/dL (ref 0–149)
VLDL Cholesterol Cal: 24 mg/dL (ref 5–40)

## 2024-05-12 LAB — TESTOSTERONE,FREE AND TOTAL
Testosterone, Free: 0.8 pg/mL — ABNORMAL LOW (ref 6.6–18.1)
Testosterone: 238 ng/dL — ABNORMAL LOW (ref 264–916)

## 2024-05-12 LAB — TSH: TSH: 1.08 u[IU]/mL (ref 0.450–4.500)

## 2024-05-12 LAB — PSA, TOTAL AND FREE
PSA, Free Pct: 27.5 %
PSA, Free: 0.22 ng/mL
Prostate Specific Ag, Serum: 0.8 ng/mL (ref 0.0–4.0)

## 2024-05-17 ENCOUNTER — Ambulatory Visit: Payer: Self-pay | Admitting: Family Medicine

## 2024-08-10 ENCOUNTER — Ambulatory Visit: Admitting: Family Medicine
